# Patient Record
Sex: Female | Born: 1960 | Race: White
Health system: Southern US, Community
[De-identification: ages and names within clinical notes are randomized; demographics above are authoritative.]

## PROBLEM LIST (undated history)

## (undated) ENCOUNTER — Emergency Department

## (undated) DIAGNOSIS — N83201 Unspecified ovarian cyst, right side: Secondary | ICD-10-CM

## (undated) DIAGNOSIS — K219 Gastro-esophageal reflux disease without esophagitis: Secondary | ICD-10-CM

## (undated) DIAGNOSIS — J302 Other seasonal allergic rhinitis: Secondary | ICD-10-CM

## (undated) DIAGNOSIS — M722 Plantar fascial fibromatosis: Secondary | ICD-10-CM

## (undated) DIAGNOSIS — F40240 Claustrophobia: Secondary | ICD-10-CM

## (undated) DIAGNOSIS — I10 Essential (primary) hypertension: Secondary | ICD-10-CM

## (undated) DIAGNOSIS — D689 Coagulation defect, unspecified: Secondary | ICD-10-CM

## (undated) DIAGNOSIS — H4089 Other specified glaucoma: Secondary | ICD-10-CM

## (undated) DIAGNOSIS — K5190 Ulcerative colitis, unspecified, without complications: Secondary | ICD-10-CM

## (undated) HISTORY — PX: GLAUCOMA SURGERY: SHX656

## (undated) HISTORY — PX: TONSILLECTOMY: SUR1361

## (undated) HISTORY — PX: HYSTERECTOMY: SHX81

## (undated) HISTORY — PX: OTHER SURGICAL HISTORY: SHX169

## (undated) HISTORY — DX: Claustrophobia: F40.240

## (undated) HISTORY — PX: BUNIONECTOMY: SHX129

## (undated) HISTORY — PX: CATARACT EXTRACTION: SUR2

---

## 1999-08-15 ENCOUNTER — Other Ambulatory Visit: Admission: RE | Admit: 1999-08-15 | Discharge: 1999-08-15 | Payer: Self-pay | Admitting: Gynecology

## 2003-12-04 ENCOUNTER — Ambulatory Visit
Admit: 2003-12-04 | Disposition: A | Discharge status: Home / Self Care | Payer: Self-pay | Source: Ambulatory Visit | Admitting: Foot & Ankle Surgery

## 2003-12-14 ENCOUNTER — Ambulatory Visit: Admission: RE | Admit: 2003-12-14 | Payer: Self-pay | Source: Ambulatory Visit | Admitting: Foot & Ankle Surgery

## 2006-08-07 ENCOUNTER — Encounter: Admission: RE | Admit: 2006-08-07 | Discharge: 2006-08-07 | Payer: Self-pay | Admitting: Internal Medicine

## 2009-08-20 ENCOUNTER — Emergency Department (HOSPITAL_COMMUNITY): Admission: EM | Admit: 2009-08-20 | Discharge: 2009-08-20 | Payer: Self-pay | Admitting: Family Medicine

## 2010-06-22 DIAGNOSIS — I824Z9 Acute embolism and thrombosis of unspecified deep veins of unspecified distal lower extremity: Secondary | ICD-10-CM

## 2010-06-22 HISTORY — DX: Acute embolism and thrombosis of unspecified deep veins of unspecified distal lower extremity: I82.4Z9

## 2010-09-25 ENCOUNTER — Emergency Department (HOSPITAL_COMMUNITY): Admission: EM | Admit: 2010-09-25 | Payer: Self-pay | Source: Home / Self Care

## 2010-12-29 ENCOUNTER — Inpatient Hospital Stay
Admission: EM | Admit: 2010-12-29 | Discharge: 2011-01-02 | Disposition: A | Discharge status: Home / Self Care | Payer: Self-pay | Source: Emergency Department | Attending: Internal Medicine | Admitting: Internal Medicine

## 2010-12-29 LAB — CBC AND DIFFERENTIAL
Basophils Absolute Automated: 0.03 10*3/uL (ref 0.00–0.20)
Basophils Automated: 0 % (ref 0–2)
Eosinophils Absolute Automated: 0.5 10*3/uL (ref 0.00–0.70)
Eosinophils Automated: 5 % (ref 0–5)
Hematocrit: 33.5 % — ABNORMAL LOW (ref 37.0–47.0)
Hgb: 11 g/dL — ABNORMAL LOW (ref 12.0–16.0)
Immature Granulocytes Absolute: 0.02 10*3/uL
Immature Granulocytes: 0 % (ref 0–1)
Lymphocytes Absolute Automated: 1.75 10*3/uL (ref 0.50–4.40)
Lymphocytes Automated: 17 % (ref 15–41)
MCH: 33.2 pg — ABNORMAL HIGH (ref 28.0–32.0)
MCHC: 32.8 g/dL (ref 32.0–36.0)
MCV: 101.2 fL — ABNORMAL HIGH (ref 80.0–100.0)
MPV: 9.9 fL (ref 9.4–12.3)
Monocytes Absolute Automated: 0.89 10*3/uL (ref 0.00–1.20)
Monocytes: 9 % (ref 0–11)
Neutrophils Absolute: 7.11 10*3/uL (ref 1.80–8.10)
Neutrophils: 69 % (ref 52–75)
Nucleated RBC: 0 /100 WBC
Platelets: 325 10*3/uL (ref 140–400)
RBC: 3.31 10*6/uL — ABNORMAL LOW (ref 4.20–5.40)
RDW: 14 % (ref 12–15)
WBC: 10.28 10*3/uL (ref 3.50–10.80)

## 2010-12-29 LAB — HEPATIC FUNCTION PANEL
ALT: 20 U/L (ref 7–56)
AST (SGOT): 18 U/L (ref 5–40)
Albumin/Globulin Ratio: 1.1 (ref 1.1–1.8)
Albumin: 3.1 g/dL — ABNORMAL LOW (ref 3.7–5.1)
Alkaline Phosphatase: 71 U/L (ref 43–122)
Bilirubin Direct: 0.4 mg/dL — ABNORMAL HIGH (ref 0.0–0.3)
Bilirubin Indirect: 0.1 mg/dL (ref 0.0–1.1)
Bilirubin, Total: 0.5 mg/dL (ref 0.2–1.3)
Globulin: 2.8 g/dL (ref 2.0–3.7)
Protein, Total: 5.9 g/dL — ABNORMAL LOW (ref 6.0–8.0)

## 2010-12-29 LAB — BASIC METABOLIC PANEL
Anion Gap: 9 (ref 5.0–15.0)
BUN: 6 mg/dL — ABNORMAL LOW (ref 7–21)
CO2: 23 mEq/L (ref 22–31)
Calcium: 8.4 mg/dL — ABNORMAL LOW (ref 8.6–10.2)
Chloride: 107 mEq/L (ref 98–107)
Creatinine: 0.6 mg/dL (ref 0.5–1.4)
Glucose: 82 mg/dL (ref 70–100)
Potassium: 4.3 mEq/L (ref 3.6–5.0)
Sodium: 139 mEq/L (ref 136–143)

## 2010-12-29 LAB — PT AND APTT
PT INR: 0.9 (ref 0.9–1.1)
PT: 12.3 s — ABNORMAL LOW (ref 12.6–15.0)
PTT: 26 s (ref 23–37)

## 2010-12-29 LAB — TYPE AND SCREEN
AB Screen Gel: NEGATIVE
ABO Rh: A POS

## 2010-12-29 LAB — GFR: EGFR: >60

## 2010-12-30 LAB — PT AND APTT
PT INR: 1 (ref 0.9–1.1)
PT: 13.3 s (ref 12.6–15.0)
PTT: 70 s — ABNORMAL HIGH (ref 23–37)

## 2010-12-30 LAB — APTT: PTT: 65 s — ABNORMAL HIGH (ref 23–37)

## 2010-12-31 ENCOUNTER — Ambulatory Visit: Payer: Self-pay

## 2010-12-31 LAB — APTT: PTT: 59 s — ABNORMAL HIGH (ref 23–37)

## 2010-12-31 LAB — CBC
Hematocrit: 31.1 % — ABNORMAL LOW (ref 37.0–47.0)
Hgb: 10.3 g/dL — ABNORMAL LOW (ref 12.0–16.0)
MCH: 33.3 pg — ABNORMAL HIGH (ref 28.0–32.0)
MCHC: 33.1 g/dL (ref 32.0–36.0)
MCV: 100.6 fL — ABNORMAL HIGH (ref 80.0–100.0)
MPV: 9.8 fL (ref 9.4–12.3)
Nucleated RBC: 0 /100 WBC
Platelets: 311 10*3/uL (ref 140–400)
RBC: 3.09 10*6/uL — ABNORMAL LOW (ref 4.20–5.40)
RDW: 14 % (ref 12–15)
WBC: 8.25 10*3/uL (ref 3.50–10.80)

## 2010-12-31 LAB — URINE HCG QUALITATIVE: Urine HCG Qualitative: NEGATIVE

## 2011-01-01 LAB — PT/INR
PT INR: 1.1 (ref 0.9–1.1)
PT: 13.7 s (ref 12.6–15.0)

## 2011-01-01 LAB — CLOSTRIDIUM DIFFICILE TOXIN B PCR: Stool Clostridium difficile Toxins A and B EIA: NEGATIVE

## 2011-01-01 LAB — LAB USE ONLY - HISTORICAL SURGICAL PATHOLOGY

## 2011-01-02 LAB — PT/INR
PT INR: 1.4 — ABNORMAL HIGH (ref 0.9–1.1)
PT: 16.7 s — ABNORMAL HIGH (ref 12.6–15.0)

## 2011-01-02 LAB — CBC AND DIFFERENTIAL
Basophils Absolute Automated: 0.02 10*3/uL (ref 0.00–0.20)
Basophils Automated: 0 % (ref 0–2)
Eosinophils Absolute Automated: 0.01 10*3/uL (ref 0.00–0.70)
Eosinophils Automated: 0 % (ref 0–5)
Hematocrit: 35.1 % — ABNORMAL LOW (ref 37.0–47.0)
Hgb: 11.9 g/dL — ABNORMAL LOW (ref 12.0–16.0)
Immature Granulocytes Absolute: 0.02 10*3/uL
Immature Granulocytes: 0 % (ref 0–1)
Lymphocytes Absolute Automated: 2.05 10*3/uL (ref 0.50–4.40)
Lymphocytes Automated: 23 % (ref 15–41)
MCH: 33.8 pg — ABNORMAL HIGH (ref 28.0–32.0)
MCHC: 33.9 g/dL (ref 32.0–36.0)
MCV: 99.7 fL (ref 80.0–100.0)
MPV: 10.2 fL (ref 9.4–12.3)
Monocytes Absolute Automated: 0.69 10*3/uL (ref 0.00–1.20)
Monocytes: 8 % (ref 0–11)
Neutrophils Absolute: 6.23 10*3/uL (ref 1.80–8.10)
Neutrophils: 69 % (ref 52–75)
Nucleated RBC: 0 /100 WBC
Platelets: 404 10*3/uL — ABNORMAL HIGH (ref 140–400)
RBC: 3.52 10*6/uL — ABNORMAL LOW (ref 4.20–5.40)
RDW: 14 % (ref 12–15)
WBC: 9 10*3/uL (ref 3.50–10.80)

## 2011-03-05 LAB — ECG 12-LEAD
Atrial Rate: 86 {beats}/min
P Axis: 69 degrees
P-R Interval: 118 ms
Q-T Interval: 358 ms
QRS Duration: 80 ms
QTC Calculation (Bezet): 428 ms
R Axis: 7 degrees
T Axis: 23 degrees
Ventricular Rate: 86 {beats}/min

## 2011-05-09 NOTE — Discharge Summary (Signed)
[name], [name]      MRN:          [id number]      Account:      [id number]      Document ID:  [id number]                  Admit Date: 12/29/2010      Discharge Date: 01/02/2011            ATTENDING PHYSICIAN:  Daisey Must, MD                  ADMISSION DIAGNOSIS:      Deep venous thrombosis, history of gastrointestinal bleed and heme-positive      stool.            DISCHARGE DIAGNOSIS:      Deep venous thrombosis, history of gastrointestinal bleed and pancolitis.            HOSPITAL COURSE:      This 50 year old woman was being evaluated by her gastroenterologist      outpatient for heme-positive stool.  The patient developed an acute      thrombosis of the left leg.  She was referred to the hospital for      anticoagulation.  Given the unclear source of her GI bleed, it was not safe      for the patient to be started on outpatient Lovenox and Coumadin therapy.      Therefore, the patient was admitted to the hospital and started on heparin.       The patient was seen by her gastroenterologist.  The patient was taken to      the endoscopy suite for colonoscopy to evaluate for blood loss in the stool      before risk stratification for long-term anticoagulation for her DVT.      Colonoscopy showed that the patient had diffuse pancolitis.  She was      started on therapy with Asacol and IV Solu-Medrol.  The patient was also      started on Lovenox and Coumadin and no evidence of gastrointestinal bleed      was noted while the patient was in the hospital after she was started on      anticoagulation.  Her hematocrit was stable.            DISCHARGE MEDICATIONS:      The patient was discharged home on Lovenox and Coumadin for anticoagulation      for her DVT.  The patient was discharged home on the following medications.       Clarinex 5 mg p.o. daily, Lopressor 100 mg p.o. daily, and Lumigan      ophthalmic solution, Nexium 20 mg daily, and Timoptic 0.5 b.i.d..  The      patient was recommended to discontinue her  oral contraceptive due to DVT.      The patient will be on Lovenox 60 mg subcutaneously b.i.d., Coumadin 5 mg      p.o. at bedtime and Asacol 800 mg three times a day and prednisone 20 mg      b.i.d.            FOLLOWUP:      The patient was instructed to make a followup appointment in the office on      Monday following her discharge.  She is to call if she has any fever,      chills, nausea, vomiting, diarrhea, or any question.  Patient has no      restrictions on activity.  She can have a regular diet and shower as      needed.  Prescription was given to the patient.                                   Page 1 of 2      CRYTAL, PENSINGER      MRN:          [id number]      Account:      [id number]      Document ID:  [id number]                                    Electronic Signing Provider            D:  01/15/2011 20:02 PM by Dr. Staci Righter, MD [phone])      T:  01/16/2011 10:59 AM by BS                        cc:                                   Page 2 of 2      Authenticated by Staci Righter, MD [phone]) On 01/26/2011 12:28:35 PM

## 2011-05-09 NOTE — H&P (Signed)
[name], [name]      MRN:          [id number]      Account:      [id number]      Document ID:  [id number]                  Admit Date: 12/29/2010            Patient Location: K469-01      Patient Type: I            ATTENDING PHYSICIAN: Daisey Must, MD                  CHIEF COMPLAINT:      Swelling in the left leg for 72 hours.            HISTORY OF PRESENT ILLNESS:      The patient is a 50 year old woman who recently had heme-positive stool and      being worked up for heme-positive stool by GI.  An endoscopy and      colonoscopy has been planned for the patient in the near future.  The      patient has swelling of her left lower extremity for about 72 hours.  A      venous Doppler was ordered as an outpatient of the left leg which showed      that the patient had a DVT in the left lower extremity.  The patient was      referred to the emergency room for admission.  She denied any chest pain or      shortness of breath recently and denies any trauma to the leg.            PAST MEDICAL HISTORY:      Hypertension, allergic rhinitis, gastroesophageal reflux, and glaucoma.            ALLERGIES:      The patient has no known drug allergy.            CURRENT MEDICATIONS:      Toprol-XL 100 mg daily, Microgestin oral contraceptive pill 1 tablet daily,      Clarinex 5 mg daily, Nexium 20 mg daily, Timoptic 0.5% one drop both eyes      twice a day, and Lumigan 1 drop both eyes daily.            SOCIAL HISTORY:      The patient does report a history of occasional cigarette use, denied any      alcohol.  The patient is married.  She lives with her husband.            FAMILY HISTORY:      Not significant for cancer or not significant for any history of blood      clots.            REVIEW OF SYSTEMS:      As mentioned above.            PHYSICAL EXAMINATION:      VITAL SIGNS:  At time of admission, temperature is 98.2, pulse rate 84,                                   Page 1 of 2      [name], [name]      MRN:          [id number]  Account:      [id number]      Document ID:  [id number]                  respirations 18, and blood pressure of 115/80.      HEENT:  Anicteric sclerae, no JVD or carotid bruit.      CARDIOVASCULAR:  S1, S2 without murmur, gallops, or rub.      LUNGS:  Clear to auscultation bilaterally.      ABDOMEN:  Positive bowel sounds, soft, nontender, nondistended, no guarding      or rebound tenderness.      EXTREMITIES:  There is no clubbing, cyanosis, or edema.  There is moderate      swelling in the left lower extremity.            ASSESSMENT AND PLAN:      This is a 50 year old woman who is being worked up for heme-positive stool      with mild anemia, now has a new left leg deep venous thrombosis, most      likely secondary to her chronic use of oral contraceptive pill.  The      patient will need to be anticoagulated; however, this cannot be completed      as an outpatient because of the patient's history of heme-positive stool      that has not been worked up at this point, she will be admitted to the      hospital and started on IV heparin.  Her gastroenterologist has been      notified and plans to complete the procedure prior to final decision of      long-term anticoagulation for 6 to 9 months on this patient.  The patient      has been instructed to stop taking oral contraceptive pill.  She reported      she is up to date on mammogram screening for malignancy.                        Electronic Signing Provider            D:  12/30/2010 13:18 PM by Dr. Staci Righter, MD [phone])      T:  12/30/2010 13:44 PM by [id number]                  cc:                                   Page 2 of 2      Authenticated by Staci Righter, MD [phone]) On 01/12/2011 09:12:22 PM

## 2011-05-09 NOTE — Consults (Signed)
[name], [name]      MRN:          [id number]      Account:      [id number]      Document ID:  [id number]      Service Date: 12/31/2010            Admit Date: 12/29/2010            Patient Location: X528-41      Patient Type: I            CONSULTING PHYSICIAN: Dayton Martes MD            REFERRING PHYSICIAN: Staci Righter MD                  CONSULTING SERVICE:      Gastroenterology.            REASON FOR CONSULTATION:      Fecal occult blood test positive.            HISTORY OF PRESENT ILLNESS:      A 50 year old female admitted with a DVT of the left lower extremity.  She      is ready to start Coumadin.  Her primary care physician, Dr. Effie Shy found      that she had a positive fecal occult blood test and so she is scheduled to      undergo colonoscopy prior to starting Coumadin.            PAST MEDICAL HISTORY:      Hypertension, reflux, glaucoma.            ALLERGIES:      None.            MEDICATIONS:      Toprol, oral contraceptives, Nexium, Timoptic eyedrops.            PAST SURGICAL HISTORY:      Unremarkable.            SOCIAL HISTORY:      Occasional tobacco use and, no alcohol.            FAMILY HISTORY:      Unremarkable.            REVIEW OF SYSTEMS:      Otherwise negative.            PHYSICAL EXAMINATION:      GENERAL:  Female in no acute distress.                                   Page 1 of 2      [name], [name]      MRN:          [id number]      Account:      [id number]      Document ID:  [id number]      Service Date: 12/31/2010            HEENT:  Sclerae anicteric.      NECK:  Supple.      LUNGS:  Clear to auscultation.      CARDIAC:  Regular rate and rhythm.      ABDOMEN:  Soft, nontender, nondistended.      EXTREMITIES:  No clubbing, cyanosis.  There is pedal edema on the left      lower extremity.  LABORATORY DATA:      Pending.            IMPRESSION:      Heme-positive stools, ready to start anticoagulation.            RECOMMENDATIONS:      Will schedule patient for  colonoscopy.  Indications and risks of procedure      including pain, bleeding, infection, perforation were discussed with the      patient.            Thank you for this kind referral.                        Electronic Signing Provider            D:  12/31/2010 10:55 AM by Dr. Dayton Martes, MD ([id number])      T:  12/31/2010 14:11 PM by [id number]                  cc:                                   Page 2 of 2      Authenticated by Dayton Martes, MD ([id number]) On 01/12/2011 01:48:51 PM

## 2011-06-09 NOTE — Op Note (Signed)
Introduction: [id number] Document ID: [id number] -- 50 year old female      patient presents for an outpatient Colonoscopy on 12/31/2010.            Indications: Fecal occult blood positive (792.1).            Consent: The benefits, risks, and alternatives to the procedure were      discussed and informed consent was obtained from the patient.            Preparation: EKG, pulse, pulse oximetry, and blood pressure were monitored      throughout the procedure.            Medications: IVA anesthesia.            Rectal Exam: Normal rectal exam.            Procedure: The colonoscope was passed through the anus under direct      visualization and was advanced with ease to the terminal ileum. The scope      was withdrawn and the mucosa was carefully examined. The views were      excellent. The patient's toleration of the procedure was excellent.      Retroflexion was performed in the rectum.            Estimated Blood Loss: Negligible.            Findings: There was evidence of severe colitis in the entire colon. The      mucosa appeared bloody, cobblestoned, and ulcerated. A biopsy was taken      from the whole colon. The ileum appeared to be normal.            Unplanned Events: There were no unplanned events.            Summary: Severe colitis (558.9) found in the entire colon. Biopsy taken.      Normal ileum.            Recommendations: Check c. Diff. Follow-up on the results of the biopsy      specimens.            Procedure Codes:            Performed By:      Version 1, electronically signed by  on 12/31/2010 at 12:40.      [email or web address]

## 2011-06-23 HISTORY — PX: EYE SURGERY: SHX253

## 2011-07-01 ENCOUNTER — Ambulatory Visit
Admit: 2011-07-01 | Discharge: 2011-07-01 | Disposition: A | Discharge status: Home / Self Care | Payer: Self-pay | Source: Ambulatory Visit

## 2011-07-09 ENCOUNTER — Ambulatory Visit
Admission: RE | Admit: 2011-07-09 | Disposition: A | Discharge status: Home / Self Care | Payer: Self-pay | Source: Ambulatory Visit | Attending: Ophthalmology | Admitting: Ophthalmology

## 2011-07-30 NOTE — Op Note (Signed)
SEVILLA, MURTAGH                                         MRN:          [id number]                                                          Account:      [id number]                                         Document ID:  [id number]                                                   Procedure Date: 07/09/2011                                                                                    Admit Date: 07/09/2011     Patient Location: DISCHARGED 07/09/2011  Patient Type: A     SURGEON: Lamount Cranker MD  ASSISTANT:none        PREOPERATIVE DIAGNOSIS:  Open-angle glaucoma, left eye.     POSTOPERATIVE DIAGNOSIS:  Open-angle glaucoma, left eye.     TITLE OF PROCEDURE:  Trabeculectomy with mitomycin-C, left eye.     COMPLICATIONS:  None.     ANESTHESIA:  General LMA.     COMPLICATIONS:  None.     DESCRIPTION OF PROCEDURE:  After appropriate informed consent was obtained, the patient was taken to  the operating room, placed in supine position.  Anesthesia performed  general anesthesia and the left eye was then prepped and draped in the  usual sterile fashion for ophthalmic surgery.  A drop of tetracaine was  given.  A lid speculum was placed.  A 7-0 Vicryl suture was placed through  superior clear cornea.  The eye was rotated inferiorly.  Conjunctival  incision was made at the limbus.  A sub-Tenon's injection of mitomycin-C  with lidocaine and epinephrine in a 1:2 mixture was given.  This was also  given in the subconjunctival space.  The overlying conjunctiva and Tenon  were then removed from bare sclera using a combination of blunt and sharp  dissection at the limbus superonasally.  Bipolar cautery was used as needed  for hemostasis.  A partial-thickness scleral flap was created into clear  cornea using a supersharp and bent crescent knife.  A supersharp was used  for this dissection.  Balanced salt solution was used to irrigate out any  residual mitomycin in the subconjunctival and Tenon space.  Paracentesis  was made and  viscoelastic was placed into the anterior chamber, followed  by  entry of the hinge of the scleral flap with sclerectomy using [facility] punch                                                                                                           Page 1 of 2  DALANI, METTE                                         MRN:          [id number]                                                          Account:      [id number]                                         Document ID:  [id number]                                                   Procedure Date: 07/09/2011                                                                                    and iridectomy with Vannas scissors and bipolar cautery for hemostasis.  Preplaced 10-0 nylon sutures were then used to close the scleral flap.  A  total of four 10-0 nylon sutures were used, 2 at the apex and 2 centrally.  All knots were buried.  Balanced salt solution was injected into the  anterior chamber with nice anterior chamber depth and adequate egress of  slow flow through the scleral flap.  Miochol was used to constrict the  pupil.  The overlying conjunctiva was closed with 10-0 Vicryl suture in a  mattress fashion centrally through a premade corneal groove.  Two  additional 10 Vicryl sutures were used nasally and temporally to close the  conjunctival wound at the limbus with knots buried into clear cornea.  The  wounds were found to be watertight with nice elevation of bleb after  irrigation of balanced salt solution into the anterior chamber.  Sub-Tenon  injection of Ancef and Decadron was given prior to the end of the case.  The 7-0 Vicryl  suture was removed and a drop of atropine, prednisolone  acetate and ofloxacin, followed by antibiotic ointment, sterile patch and  shield were given.  The patient went to the recovery room in stable  condition without any complications.                 D:  07/09/2011 18:16 PM by Dr. Hughie Closs I. Ninfa Linden, MD ([id number])  T:  07/09/2011 22:48  PM by [id number]        cc:                                                                                                           Page 2 of 2  Authenticated and Edited by Hughie Closs I. Ninfa Linden, MD ([id number]) On 07/30/11 12:24:00 PM

## 2011-09-23 MED ORDER — DIPHENHYDRAMINE HCL 25 MG PO CAPS
25.00 mg | ORAL_CAPSULE | Freq: Once | ORAL | Status: AC
Start: 2011-09-24 — End: 2011-09-24

## 2011-09-23 MED ORDER — DIPHENHYDRAMINE HCL 50 MG/ML IJ SOLN
50.00 mg | Freq: Once | INTRAMUSCULAR | Status: DC
Start: 2011-09-24 — End: 2011-10-01

## 2011-09-23 MED ORDER — DIPHENHYDRAMINE HCL 50 MG/ML IJ SOLN
25.00 mg | Freq: Once | INTRAMUSCULAR | Status: AC
Start: 2011-09-24 — End: 2011-09-24
  Administered 2011-09-24: 25 mg via INTRAVENOUS
  Filled 2011-09-23: qty 1

## 2011-09-23 MED ORDER — DIPHENHYDRAMINE HCL 25 MG PO CAPS
50.00 mg | ORAL_CAPSULE | Freq: Once | ORAL | Status: DC
Start: 2011-09-24 — End: 2011-10-01

## 2011-09-23 MED ORDER — METHYLPREDNISOLONE SODIUM SUCC 40 MG IJ SOLR
25.00 mg | Freq: Once | INTRAMUSCULAR | Status: DC | PRN
Start: 2011-09-24 — End: 2011-10-01

## 2011-09-23 MED ORDER — DIPHENHYDRAMINE HCL 50 MG/ML IJ SOLN
50.00 mg | Freq: Once | INTRAMUSCULAR | Status: DC | PRN
Start: 2011-09-24 — End: 2011-10-01

## 2011-09-23 MED ORDER — ACETAMINOPHEN 325 MG PO TABS
650.00 mg | ORAL_TABLET | Freq: Once | ORAL | Status: AC
Start: 2011-09-24 — End: 2011-09-24
  Administered 2011-09-24: 650 mg via ORAL
  Filled 2011-09-23: qty 2

## 2011-09-24 ENCOUNTER — Ambulatory Visit
Admission: RE | Admit: 2011-09-24 | Discharge: 2011-09-24 | Disposition: A | Discharge status: Home / Self Care | Payer: BC Managed Care – PPO | Source: Ambulatory Visit | Attending: Gastroenterology | Admitting: Gastroenterology

## 2011-09-24 VITALS — BP 120/82 | HR 56 | Temp 95.6°F | Resp 18 | Ht 63.0 in | Wt 139.0 lb

## 2011-09-24 DIAGNOSIS — K5289 Other specified noninfective gastroenteritis and colitis: Secondary | ICD-10-CM | POA: Insufficient documentation

## 2011-09-24 HISTORY — DX: Other seasonal allergic rhinitis: J30.2

## 2011-09-24 HISTORY — DX: Essential (primary) hypertension: I10

## 2011-09-24 HISTORY — DX: Other specified glaucoma: H40.89

## 2011-09-24 HISTORY — DX: Coagulation defect, unspecified: D68.9

## 2011-09-24 HISTORY — DX: Gastro-esophageal reflux disease without esophagitis: K21.9

## 2011-09-24 MED ORDER — INFLIXIMAB 100 MG IV SOLR
300.00 mg | Freq: Once | INTRAVENOUS | Status: AC
Start: 2011-09-24 — End: 2011-09-24
  Administered 2011-09-24: 300 mg via INTRAVENOUS
  Filled 2011-09-24: qty 300

## 2011-09-24 NOTE — Progress Notes (Signed)
Pt is here for Remicade today.  Medication started at 10cc hr for 15 minutes, then advanced to 20cchr for 15 minutes. Advanced to 40cc hr for 15 min, 80cchr for 15 minutes, to 150 cchr for 30 minutes and to 250cc hr til finished. Pt understands possible complications of drug and when to call the doctor for problems and for follow up. Liliane Shi RN [facility]

## 2011-09-24 NOTE — Patient Instructions (Signed)
AFTER REMICADE WATCH FOR ALLERGIC REACTIONS SUCH AS : itching or hives, swelling in your face or hands, swelling or tingling in your mouth or throat,chest tightness, trouble breathing. Watch for a change in the color of your urine and stools, change in the amount and how often you urinate. Fast,slow,or pounding heartbeat or chest pain. Fever,headache, sore throat, chills  or body aches, joint or muscle pain.    Also lightheadedness,  dizziness, fainting, nausea, vomiting, loss of appetite, or pain in your upper stomach.  Numbness, tingling, or burning pain in your hands, arms, legs, or feet. Problems with vision, speech, or walking.   Seizures. Swollen glands. Trouble breathing or swallowing.  Unexplained weight  loss or night sweats or the yellowing of your skin and or the whites of your eyes.  Call your doctor if any of these occur and or if concerns.   Jendayi Berling L Klynn Linnemann RN BC

## 2011-09-24 NOTE — Progress Notes (Signed)
1225 Pt left walking to go to a meeting in [employer].

## 2011-10-21 ENCOUNTER — Ambulatory Visit
Admission: RE | Admit: 2011-10-21 | Discharge: 2011-10-21 | Disposition: A | Discharge status: Home / Self Care | Payer: BC Managed Care – PPO | Source: Ambulatory Visit | Attending: Gastroenterology | Admitting: Gastroenterology

## 2011-10-21 ENCOUNTER — Other Ambulatory Visit: Payer: Self-pay | Admitting: Gastroenterology

## 2011-10-21 DIAGNOSIS — K5190 Ulcerative colitis, unspecified, without complications: Secondary | ICD-10-CM

## 2011-10-28 LAB — PROMETHEUS TPMT GENETICS - SOFT

## 2011-11-11 MED ORDER — DIPHENHYDRAMINE HCL 50 MG/ML IJ SOLN
50.00 mg | INTRAMUSCULAR | Status: DC | PRN
Start: 2011-11-12 — End: 2011-11-14

## 2011-11-11 MED ORDER — DIPHENHYDRAMINE HCL 25 MG PO CAPS
50.00 mg | ORAL_CAPSULE | Freq: Once | ORAL | Status: DC
Start: 2011-11-12 — End: 2011-11-14

## 2011-11-11 MED ORDER — DIPHENHYDRAMINE HCL 25 MG PO CAPS
25.00 mg | ORAL_CAPSULE | Freq: Once | ORAL | Status: AC
Start: 2011-11-12 — End: 2011-11-12

## 2011-11-11 MED ORDER — METHYLPREDNISOLONE SODIUM SUCC 40 MG IJ SOLR
25.00 mg | INTRAMUSCULAR | Status: DC | PRN
Start: 2011-11-12 — End: 2011-11-14

## 2011-11-11 MED ORDER — DIPHENHYDRAMINE HCL 50 MG/ML IJ SOLN
25.00 mg | Freq: Once | INTRAMUSCULAR | Status: AC
Start: 2011-11-12 — End: 2011-11-12
  Administered 2011-11-12: 25 mg via INTRAVENOUS
  Filled 2011-11-11: qty 1

## 2011-11-11 MED ORDER — DIPHENHYDRAMINE HCL 50 MG/ML IJ SOLN
50.00 mg | Freq: Once | INTRAMUSCULAR | Status: DC
Start: 2011-11-12 — End: 2011-11-14

## 2011-11-11 MED ORDER — ACETAMINOPHEN 325 MG PO TABS
650.00 mg | ORAL_TABLET | Freq: Once | ORAL | Status: AC
Start: 2011-11-12 — End: 2011-11-12
  Administered 2011-11-12: 650 mg via ORAL
  Filled 2011-11-11: qty 2

## 2011-11-12 ENCOUNTER — Ambulatory Visit
Admission: RE | Admit: 2011-11-12 | Discharge: 2011-11-12 | Disposition: A | Discharge status: Home / Self Care | Payer: BC Managed Care – PPO | Source: Ambulatory Visit | Attending: Gastroenterology | Admitting: Gastroenterology

## 2011-11-12 VITALS — BP 123/62 | HR 72 | Temp 97.7°F | Resp 16 | Ht 63.0 in | Wt 140.0 lb

## 2011-11-12 DIAGNOSIS — K5190 Ulcerative colitis, unspecified, without complications: Secondary | ICD-10-CM | POA: Insufficient documentation

## 2011-11-12 MED ORDER — INFLIXIMAB 100 MG IV SOLR
300.00 mg | Freq: Once | INTRAVENOUS | Status: AC
Start: 2011-11-12 — End: 2011-11-12
  Administered 2011-11-12: 300 mg via INTRAVENOUS
  Filled 2011-11-12: qty 300

## 2011-11-12 NOTE — Discharge Instructions (Signed)
Discharge Instructions for Ulcerative Colitis  You have been diagnosed with ulcerative colitis. Ulcerative colitisis inflammation (irritation and swelling) that occurs in the rectum and colon. It is a form of inflammatory bowel disease (IBD). No one knows what causes IBD, but the symptoms can be treated. People with IBD can lead full,active lives.  Home Care   Follow the diet that was prescribed for you in the hospital.   Avoid any foods that make your symptoms worse. These foods vary from person to person.   Keep a diary of foods that disagree with you and share this information with your doctor or nutritionist.   Take your medications as directed. The doctor may ask you to take several different types.   Talk to your doctor about the need for surgery. Some patients choose to have their colon removed. This treatment has side effects. Only you and your doctor can make this decision.  Follow-Up  Make a follow-up appointment as directed by our staff.  When to Call Your Doctor  Call your doctor immediately if you have any of the following:   Bleeding from your rectum   Pain or cramping in your abdomen   Urgent need to go to the bathroom   Bloody diarrhea   Fever above 100F   Weight loss   Nausea   Vomiting    [address] [place], [place] [address]. All rights reserved. This information is not intended as a substitute for professional medical care. Always follow your healthcare professional's instructions.  Infliximab Solution for injection  What is this medicine?  INFLIXIMAB (in FLIX i mab) is used to treat Crohn's disease and ulcerative colitis. It is also used to treat ankylosing spondylitis, psoriasis, and some forms of arthritis.  This medicine may be used for other purposes; ask your health care provider or pharmacist if you have questions.  What should I tell my health care provider before I take this medicine?  They need to know if you have any of these  conditions:   diabetes   exposure to tuberculosis   heart failure   hepatitis or liver disease   immune system problems   infection   lung or breathing disease, like COPD   multiple sclerosis   current or past resident of [place]   seizure disorder   an unusual or allergic reaction to infliximab, mouse proteins, other medicines, foods, dyes, or preservatives   pregnant or trying to get pregnant   breast-feeding  How should I use this medicine?  This medicine is for injection into a vein. It is usually given by a health care professional in a hospital or clinic setting.  A special MedGuide will be given to you by the pharmacist with each prescription and refill. Be sure to read this information carefully each time.  Talk to your pediatrician regarding the use of this medicine in children. Special care may be needed.  Overdosage: If you think you have taken too much of this medicine contact a poison control center or emergency room at once.  NOTE: This medicine is only for you. Do not share this medicine with others.  What if I miss a dose?  It is important not to miss your dose. Call your doctor or health care professional if you are unable to keep an appointment.  What may interact with this medicine?  Do not take this medicine with any of the following medications:   anakinra   rilonacept  This medicine may also interact with the following medications:   vaccines  This list may not describe all possible interactions. Give your health care provider a list of all the medicines, herbs, non-prescription drugs, or dietary supplements you use. Also tell them if you smoke, drink alcohol, or use illegal drugs. Some items may interact with your medicine.  What should I watch for while using this medicine?  Visit your doctor or health care professional for regular checks on your progress.  If you get a cold or other infection while receiving this medicine, call your doctor or health  care professional. Do not treat yourself. This medicine may decrease your body's ability to fight infections. Before beginning therapy, your doctor may do a test to see if you have been exposed to tuberculosis.  This medicine may make the symptoms of heart failure worse in some patients. If you notice symptoms such as increased shortness of breath or swelling of the ankles or legs, contact your health care provider right away.  If you are going to have surgery or dental work, tell your health care professional or dentist that you have received this medicine.  If you take this medicine for plaque psoriasis, stay out of the sun. If you cannot avoid being in the sun, wear protective clothing and use sunscreen. Do not use sun lamps or tanning beds/booths.  What side effects may I notice from receiving this medicine?  Side effects that you should report to your doctor or health care professional as soon as possible:   allergic reactions like skin rash, itching or hives, swelling of the face, lips, or tongue   chest pain   fever or chills, usually related to the infusion   muscle or joint pain   red, scaly patches or raised bumps on the skin   signs of infection - fever or chills, cough, sore throat, pain or difficulty passing urine   swollen lymph nodes in the neck, underarm, or groin areas   unexplained weight loss   unusual bleeding or bruising   unusually weak or tired   yellowing of the eyes or skin  Side effects that usually do not require medical attention (report to your doctor or health care professional if they continue or are bothersome):   headache   heartburn or stomach pain   nausea, vomiting  This list may not describe all possible side effects. Call your doctor for medical advice about side effects. You may report side effects to FDA at 1-800-FDA-1088.  Where should I keep my medicine?  This drug is given in a hospital or clinic and will not be stored at home.  NOTE:This sheet is a summary.  It may not cover all possible information. If you have questions about this medicine, talk to your doctor, pharmacist, or health care provider. Copyright 2011 [employer] Standard  ULCERATIVE COLITIS  You have been diagnosed with ulcerative colitis. Ulcerative colitis is inflammation that occurs in the rectum and colon. It is a form of Inflammatory Bowel Disease (IBD). No one knows what causes IBD, but the symptoms can be treated and people with IBD can lead full, active lives.  HOME CARE:   Follow the diet that was prescribed for you by your doctor.   Avoid any foods that make your symptoms worse. These foods vary from person to person.   Keep a diary of foods that disagree with you, and share this information with your doctor or nutritionist.   Take your medications as directed.  FOLLOW UP with your doctor or as advised by our staff. Report any unintended weight loss over 10 pounds over 3-6 months to your doctor.  GET PROMPT MEDICAL ATTENTION if any of the following occur:   Bleeding from your rectum   Frequent diarrhea or abdominal pain not controlled by your medicine   Bloody diarrhea   Fever of 100.68F (38C) or higher, or as directed by your healthcare provider   Persistent nausea or repeated vomiting   2000-2011 [employer], [address], [place], [place] [address]. All rights reserved. This information is not intended as a substitute for professional medical care. Always follow your healthcare professional's instruct  ULCERATIVE COLITIS  You have been diagnosed with ulcerative colitis. Ulcerative colitis is inflammation that occurs in the rectum and colon. It is a form of Inflammatory Bowel Disease (IBD). No one knows what causes IBD, but the symptoms can be treated and people with IBD can lead full, active lives.  HOME CARE:   Follow the diet that was prescribed for you by your doctor.   Avoid any foods that make your symptoms worse. These foods vary from person to person.   Keep a diary of  foods that disagree with you, and share this information with your doctor or nutritionist.   Take your medications as directed.  FOLLOW UP with your doctor or as advised by our staff. Report any unintended weight loss over 10 pounds over 3-6 months to your doctor.  GET PROMPT MEDICAL ATTENTION if any of the following occur:   Bleeding from your rectum   Frequent diarrhea or abdominal pain not controlled by your medicine   Bloody diarrhea   Fever of 100.68F (38C) or higher, or as directed by your healthcare provider   Persistent nausea or repeated vomiting   2000-2011 [employer], [address] [place], [place] [address]. All rights reserved. This information is not intended as a substitute for professional medical care. Always follow your healthcare professional's instructions.  ULCERATIVE COLITIS  You have been diagnosed with ulcerative colitis. Ulcerative colitis is inflammation that occurs in the rectum and colon. It is a form of Inflammatory Bowel Disease (IBD). No one knows what causes IBD, but the symptoms can be treated and people with IBD can lead full, active lives.  HOME CARE:   Follow the diet that was prescribed for you by your doctor.   Avoid any foods that make your symptoms worse. These foods vary from person to person.   Keep a diary of foods that disagree with you, and share this information with your doctor or nutritionist.   Take your medications as directed.  FOLLOW UP with your doctor or as advised by our staff. Report any unintended weight loss over 10 pounds over 3-6 months to your doctor.  GET PROMPT MEDICAL ATTENTION if any of the following occur:   Bleeding from your rectum   Frequent diarrhea or abdominal pain not controlled by your medicine   Bloody diarrhea   Fever of 100.68F (38C) or higher, or as directed by your healthcare provider   Persistent nausea or repeated vomiting   2000-2011 [employer], [address], [place], [place] [address]. All rights  reserved. This information is not intended as a substitute for professional medical care. Always follow your healthcare professional's instructions.ions.

## 2011-11-12 NOTE — Progress Notes (Signed)
6433  Arrived ambulatory to [facility] for a remicade infusion. States she had a flare and was started on mercaptopurine. Premeds given.  Remicade was started at 10 mL /hour x15 minutes, then 20 mL/hr x15 minutes, then 40 mL/hr for 15 minutes, then 80 mL/hr for 15 min, then 150 mL/hr for 30 min., then 250 mL/hr till infusion completed. 1210  Remicade completed without problem.  Patient states she will call MD for problems.  1216 Patient left in NAD, ambulatory.

## 2011-12-30 MED ORDER — DIPHENHYDRAMINE HCL 50 MG/ML IJ SOLN
50.00 mg | Freq: Once | INTRAMUSCULAR | Status: DC
Start: 2011-12-31 — End: 2012-01-01

## 2011-12-30 MED ORDER — DIPHENHYDRAMINE HCL 50 MG/ML IJ SOLN
25.00 mg | Freq: Once | INTRAMUSCULAR | Status: AC
Start: 2011-12-31 — End: 2011-12-31
  Administered 2011-12-31: 25 mg via INTRAVENOUS
  Filled 2011-12-30: qty 1

## 2011-12-30 MED ORDER — DIPHENHYDRAMINE HCL 25 MG PO CAPS
50.00 mg | ORAL_CAPSULE | Freq: Once | ORAL | Status: DC
Start: 2011-12-31 — End: 2012-01-01

## 2011-12-30 MED ORDER — DIPHENHYDRAMINE HCL 25 MG PO CAPS
25.00 mg | ORAL_CAPSULE | Freq: Once | ORAL | Status: AC
Start: 2011-12-31 — End: 2011-12-31

## 2011-12-30 MED ORDER — INFLIXIMAB 100 MG IV SOLR
300.00 mg | Freq: Once | INTRAVENOUS | Status: AC
Start: 2011-12-31 — End: 2011-12-31
  Administered 2011-12-31: 300 mg via INTRAVENOUS
  Filled 2011-12-30: qty 300

## 2011-12-30 MED ORDER — DIPHENHYDRAMINE HCL 50 MG/ML IJ SOLN
50.00 mg | INTRAMUSCULAR | Status: DC | PRN
Start: 2011-12-31 — End: 2012-01-01

## 2011-12-30 MED ORDER — METHYLPREDNISOLONE SODIUM SUCC 40 MG IJ SOLR
25.00 mg | INTRAMUSCULAR | Status: DC | PRN
Start: 2011-12-31 — End: 2012-01-01

## 2011-12-30 MED ORDER — ACETAMINOPHEN 325 MG PO TABS
650.00 mg | ORAL_TABLET | Freq: Once | ORAL | Status: AC
Start: 2011-12-31 — End: 2011-12-31
  Administered 2011-12-31: 650 mg via ORAL
  Filled 2011-12-30: qty 2

## 2011-12-31 ENCOUNTER — Ambulatory Visit
Admission: RE | Admit: 2011-12-31 | Discharge: 2011-12-31 | Disposition: A | Discharge status: Home / Self Care | Payer: BC Managed Care – PPO | Source: Ambulatory Visit | Attending: Gastroenterology | Admitting: Gastroenterology

## 2011-12-31 VITALS — BP 117/64 | HR 56 | Temp 97.6°F | Resp 16 | Ht 63.0 in | Wt 139.0 lb

## 2011-12-31 DIAGNOSIS — K5289 Other specified noninfective gastroenteritis and colitis: Secondary | ICD-10-CM | POA: Insufficient documentation

## 2011-12-31 DIAGNOSIS — K5190 Ulcerative colitis, unspecified, without complications: Secondary | ICD-10-CM

## 2011-12-31 HISTORY — DX: Ulcerative colitis, unspecified, without complications: K51.90

## 2011-12-31 NOTE — Progress Notes (Signed)
9147  Arrived ambulatory to [facility] for a remicade infusion.  No complaints.  Premeds given.  1019 Remicade was started at 10 mL /hour x15 minutes, then 20 mL/hr x15 minutes, then 40 mL/hr for 15 minutes, then 80 mL/hr for 15 min, then 150 mL/hr for 30 min., then 250 mL/hr till infusion completed.  1228 Remicade completed without problem.  Patient states she will call MD for problems. 1233  Patient left in NAD, ambulatory.

## 2012-01-01 ENCOUNTER — Ambulatory Visit: Payer: BC Managed Care – PPO | Attending: Ophthalmology

## 2012-01-01 NOTE — Pre-Procedure Instructions (Signed)
All preops done  Faxed to surgeon for preops/HP/consent  Faxed dos order to pharmacy

## 2012-01-01 NOTE — Pre-Procedure Instructions (Signed)
Clearance/abn labs sent for scan  Faxed to surgeon for better copy of ekg

## 2012-01-07 ENCOUNTER — Encounter: Payer: Self-pay | Admitting: Certified Registered"

## 2012-01-07 ENCOUNTER — Ambulatory Visit: Payer: BC Managed Care – PPO | Admitting: Certified Registered"

## 2012-01-07 ENCOUNTER — Ambulatory Visit
Admission: RE | Admit: 2012-01-07 | Discharge: 2012-01-07 | Disposition: A | Discharge status: Home / Self Care | Payer: BC Managed Care – PPO | Source: Ambulatory Visit | Attending: Ophthalmology | Admitting: Ophthalmology

## 2012-01-07 ENCOUNTER — Encounter
Admission: RE | Disposition: A | Discharge status: Home / Self Care | Payer: Self-pay | Source: Ambulatory Visit | Attending: Ophthalmology

## 2012-01-07 ENCOUNTER — Ambulatory Visit: Payer: BC Managed Care – PPO | Admitting: Ophthalmology

## 2012-01-07 DIAGNOSIS — I10 Essential (primary) hypertension: Secondary | ICD-10-CM | POA: Insufficient documentation

## 2012-01-07 DIAGNOSIS — K219 Gastro-esophageal reflux disease without esophagitis: Secondary | ICD-10-CM | POA: Insufficient documentation

## 2012-01-07 DIAGNOSIS — H401290 Low-tension glaucoma, unspecified eye, stage unspecified: Secondary | ICD-10-CM

## 2012-01-07 DIAGNOSIS — Z86718 Personal history of other venous thrombosis and embolism: Secondary | ICD-10-CM | POA: Insufficient documentation

## 2012-01-07 DIAGNOSIS — H409 Unspecified glaucoma: Secondary | ICD-10-CM | POA: Insufficient documentation

## 2012-01-07 DIAGNOSIS — K5190 Ulcerative colitis, unspecified, without complications: Secondary | ICD-10-CM | POA: Insufficient documentation

## 2012-01-07 DIAGNOSIS — H4011X0 Primary open-angle glaucoma, stage unspecified: Secondary | ICD-10-CM | POA: Insufficient documentation

## 2012-01-07 DIAGNOSIS — Z7901 Long term (current) use of anticoagulants: Secondary | ICD-10-CM | POA: Insufficient documentation

## 2012-01-07 SURGERY — TRABECULECTOMY
Anesthesia: Anesthesia MAC / Sedation | Site: Eye | Laterality: Right | Wound class: Clean

## 2012-01-07 MED ORDER — ONDANSETRON HCL 4 MG/2ML IJ SOLN
INTRAMUSCULAR | Status: DC | PRN
Start: 2012-01-07 — End: 2012-01-07
  Administered 2012-01-07: 4 mg via INTRAVENOUS

## 2012-01-07 MED ORDER — LIDOCAINE HCL 2 % IJ SOLN
INTRAMUSCULAR | Status: DC | PRN
Start: 2012-01-07 — End: 2012-01-07
  Administered 2012-01-07: 100 mg

## 2012-01-07 MED ORDER — HYDROMORPHONE HCL PF 1 MG/ML IJ SOLN
0.5000 mg | INTRAMUSCULAR | Status: DC | PRN
Start: 2012-01-07 — End: 2012-01-07

## 2012-01-07 MED ORDER — TETRACAINE HCL 0.5 % OP SOLN
OPHTHALMIC | Status: DC | PRN
Start: 2012-01-07 — End: 2012-01-07
  Administered 2012-01-07: 2 [drp] via OPHTHALMIC

## 2012-01-07 MED ORDER — BSS IO SOLN
INTRAOCULAR | Status: DC | PRN
Start: 2012-01-07 — End: 2012-01-07
  Administered 2012-01-07: 10 mL via OPHTHALMIC

## 2012-01-07 MED ORDER — OFLOXACIN 0.3 % OP SOLN
OPHTHALMIC | Status: DC | PRN
Start: 2012-01-07 — End: 2012-01-07
  Administered 2012-01-07: 2 [drp] via OPHTHALMIC

## 2012-01-07 MED ORDER — ONDANSETRON HCL 4 MG/2ML IJ SOLN
4.0000 mg | Freq: Once | INTRAMUSCULAR | Status: DC | PRN
Start: 2012-01-07 — End: 2012-01-07

## 2012-01-07 MED ORDER — FENTANYL CITRATE 0.05 MG/ML IJ SOLN
INTRAMUSCULAR | Status: DC | PRN
Start: 2012-01-07 — End: 2012-01-07
  Administered 2012-01-07: 100 ug via INTRAVENOUS

## 2012-01-07 MED ORDER — MITOMYCIN CHEMO INTRAOCULAR INJECTION 0.4 MG/ML
5.0000 mL | Status: AC
Start: 2012-01-07 — End: 2012-01-07
  Administered 2012-01-07: .5 mL via INTRAOCULAR
  Filled 2012-01-07: qty 10

## 2012-01-07 MED ORDER — OXYCODONE-ACETAMINOPHEN 5-325 MG PO TABS
1.0000 | ORAL_TABLET | Freq: Once | ORAL | Status: AC | PRN
Start: 2012-01-07 — End: 2012-01-07

## 2012-01-07 MED ORDER — OXYCODONE-ACETAMINOPHEN 5-325 MG PO TABS
ORAL_TABLET | ORAL | Status: AC
Start: 2012-01-07 — End: 2012-01-07
  Administered 2012-01-07: 1 via ORAL
  Filled 2012-01-07: qty 1

## 2012-01-07 MED ORDER — MEPERIDINE HCL 25 MG/ML IJ SOLN
25.0000 mg | INTRAMUSCULAR | Status: DC | PRN
Start: 2012-01-07 — End: 2012-01-07

## 2012-01-07 MED ORDER — MIDAZOLAM HCL 2 MG/2ML IJ SOLN
INTRAMUSCULAR | Status: AC
Start: 2012-01-07 — End: ?
  Filled 2012-01-07: qty 1

## 2012-01-07 MED ORDER — MIDAZOLAM HCL 2 MG/2ML IJ SOLN
INTRAMUSCULAR | Status: DC | PRN
Start: 2012-01-07 — End: 2012-01-07
  Administered 2012-01-07: 2 mg via INTRAVENOUS

## 2012-01-07 MED ORDER — LACTATED RINGERS IV SOLN
INTRAVENOUS | Status: DC | PRN
Start: 2012-01-07 — End: 2012-01-07

## 2012-01-07 MED ORDER — ACETYLCHOLINE CHLORIDE 1:100 IO SOLR
INTRAOCULAR | Status: DC | PRN
Start: 2012-01-07 — End: 2012-01-07
  Administered 2012-01-07: 1 mL via INTRAOCULAR

## 2012-01-07 MED ORDER — FENTANYL CITRATE 0.05 MG/ML IJ SOLN
INTRAMUSCULAR | Status: AC
Start: 2012-01-07 — End: 2012-01-07
  Administered 2012-01-07: 25 ug via INTRAVENOUS
  Filled 2012-01-07: qty 2

## 2012-01-07 MED ORDER — CEFAZOLIN SODIUM 1 G IJ SOLR
INTRAMUSCULAR | Status: DC | PRN
Start: 2012-01-07 — End: 2012-01-07
  Administered 2012-01-07: 1 g via SUBCONJUNCTIVAL

## 2012-01-07 MED ORDER — LIDOCAINE HCL 2 % EX GEL
CUTANEOUS | Status: AC
Start: 2012-01-07 — End: ?
  Filled 2012-01-07: qty 0.5

## 2012-01-07 MED ORDER — ERYTHROMYCIN 5 MG/GM OP OINT
TOPICAL_OINTMENT | OPHTHALMIC | Status: DC | PRN
Start: 2012-01-07 — End: 2012-01-07
  Administered 2012-01-07: 1 via OPHTHALMIC

## 2012-01-07 MED ORDER — LIDOCAINE-EPINEPHRINE 2 %-1:200000 IJ SOLN
INTRAMUSCULAR | Status: AC
Start: 2012-01-07 — End: ?
  Filled 2012-01-07: qty 1

## 2012-01-07 MED ORDER — PROMETHAZINE HCL 25 MG/ML IJ SOLN
6.2500 mg | Freq: Once | INTRAMUSCULAR | Status: DC | PRN
Start: 2012-01-07 — End: 2012-01-07

## 2012-01-07 MED ORDER — LIDOCAINE-EPINEPHRINE 2 %-1:200000 IJ SOLN
INTRAMUSCULAR | Status: DC | PRN
Start: 2012-01-07 — End: 2012-01-07
  Administered 2012-01-07: .5 mL

## 2012-01-07 MED ORDER — FENTANYL CITRATE 0.05 MG/ML IJ SOLN
25.0000 ug | INTRAMUSCULAR | Status: DC | PRN
Start: 2012-01-07 — End: 2012-01-07
  Administered 2012-01-07: 25 ug via INTRAVENOUS

## 2012-01-07 MED ORDER — STERILE WATER FOR IRRIGATION IR SOLN
Status: DC | PRN
Start: 2012-01-07 — End: 2012-01-07
  Administered 2012-01-07: 100 mL

## 2012-01-07 MED ORDER — PREDNISOLONE ACETATE 1 % OP SUSP
OPHTHALMIC | Status: DC | PRN
Start: 2012-01-07 — End: 2012-01-07
  Administered 2012-01-07: 2 [drp] via OPHTHALMIC

## 2012-01-07 MED ORDER — PROPOFOL 10 MG/ML IV EMUL
INTRAVENOUS | Status: AC
Start: 2012-01-07 — End: ?
  Filled 2012-01-07: qty 1

## 2012-01-07 MED ORDER — ONDANSETRON HCL 4 MG/2ML IJ SOLN
INTRAMUSCULAR | Status: AC
Start: 2012-01-07 — End: ?
  Filled 2012-01-07: qty 2

## 2012-01-07 MED ORDER — ATROPINE SULFATE 1 % OP SOLN
OPHTHALMIC | Status: DC | PRN
Start: 2012-01-07 — End: 2012-01-07
  Administered 2012-01-07: 2 [drp] via OPHTHALMIC

## 2012-01-07 MED ORDER — FENTANYL CITRATE 0.05 MG/ML IJ SOLN
INTRAMUSCULAR | Status: AC
Start: 2012-01-07 — End: ?
  Filled 2012-01-07: qty 2

## 2012-01-07 MED ORDER — LIDOCAINE HCL 2 % EX GEL
CUTANEOUS | Status: DC | PRN
Start: 2012-01-07 — End: 2012-01-07
  Administered 2012-01-07: 1 via TOPICAL

## 2012-01-07 MED ORDER — PROPOFOL INFUSION 10 MG/ML
INTRAVENOUS | Status: DC | PRN
Start: 2012-01-07 — End: 2012-01-07
  Administered 2012-01-07: 200 mg via INTRAVENOUS

## 2012-01-07 SURGICAL SUPPLY — 24 items
ANTERIOR CANNULA (Opthamology Supply) ×1 IMPLANT
APPLICATOR COTTON TIP STRL 6IN (Prep) ×1 IMPLANT
CLOSURE STERI-STRIP 1X5IN (Dressing) ×1 IMPLANT
CLOTH BEACON TIMEOUT ORANGE (Other) ×1 IMPLANT
CORD BIPOLAR STRL DISP (Cautery) ×1 IMPLANT
DRAPE 225MM HEAD COVER EQUIPMENT BIOM STERILE DISPOSABLE (Drape) ×1 IMPLANT
DRAPE EQP 225MM BIOM STRL HD CVR DISP (Drape) ×1
ERASER HEMOSTATIC TPERED 23G (Cautery) IMPLANT
GOWN SMART IMPERVIOUS LARGE (Gown) ×1 IMPLANT
KIT INFECTION CONTROL CUSTOM (Kits) ×1
KIT INFECTION CONTROL CUSTOM IFOH03 (Kits) ×1 IMPLANT
NEEDLE HPO PP RW PRCSNGL 30GA .5IN LF (Needles) IMPLANT
PROBE HEMOSTATIC ERASER STRAIGHT OD18 GA BIPOLAR HOLLOW CORE TARGET (Cautery) ×1 IMPLANT
PROBE HMERSR STRG WTFLD ERS 18GA BP HLW (Cautery) ×1
SOLUTION IRRIGATION 0.9% BALANCED SALT (Opthamology Supply) ×1
SOLUTION IRRIGATION 0.9% BALANCED SALT 15 ML (Opthamology Supply) ×1 IMPLANT
SPEAR EYE SURGICAL (Sponge) ×2 IMPLANT
SUTURE COATED VICRYL 7-0 TG160-8 L18 IN (Suture) ×1
SUTURE COATED VICRYL 7-0 TG160-8 L18 IN 2 ARM BRAID COATED VIOLET (Suture) ×1 IMPLANT
SUTURE ETHILON 10-0 TG16 12IN (Suture) ×1 IMPLANT
SUTURE VICRYL 10-0 CS160-6 L12 IN 2 ARM (Suture) ×1
SUTURE VICRYL 10-0 CS160-6 L12 IN 2 ARM MONOFILAMENT COATED VIOLET (Suture) ×1 IMPLANT
SUTURE VICRYL 8-0 9IN V130-4 (Suture) IMPLANT
WATER STERILE PLASTIC POUR BOTTLE 250 ML (Irrigation Solutions) ×1 IMPLANT

## 2012-01-07 NOTE — Anesthesia Postprocedure Evaluation (Signed)
Anesthesia Post Evaluation    Patient: [name]    Procedures performed: Procedure(s) with comments:  TRABECULECTOMY - RIGHT EYE TRABECULECTOMY  ANES=MAC; EQUIP=Mitomycin C 0.4mg /cc/Dispense 5cc; MD REQ=24min; Q1=N; ALLRGS=Casopt,Azopt,Alphagan-P    Anesthesia type: General LMA    Patient location:Phase I PACU    Last vitals:   Filed Vitals:    01/07/12 1630   BP: 124/83   Pulse:    Temp:    Resp: 16   SpO2:        Post pain: Patient not complaining of pain, continue current therapy     Mental Status:awake    Respiratory Function: tolerating nasal cannula    Cardiovascular: stable    Nausea/Vomiting: patient not complaining of nausea or vomiting    Hydration Status: adequate    Post assessment: no apparent anesthetic complications        [id number]

## 2012-01-07 NOTE — Discharge Instructions (Signed)
Discharge Instructions for Glaucoma Patients     Rest today -You may experience dizziness or sleepiness.  Do not drive home. You should not operate a motor vehicle, machinery or power tools for at least 24 hours. DO NOT stay alone for 24 hours.  Stop using all glaucoma eye drops and eye medications that you used before surgery in the operated eye.  Continue using all drops that you were using in the un-operated eye before surgery and discontinue any oral medications prescribed for lowering your eye pressure (Acetazolamide, Methazolamide, Diamox or Neptazane ).  Remove the patch when you wake up tomorrow morning unless otherwise instructed by your Doctor.  After removing the patch, place one drop of the antibiotic drop (tan top) in the operated eye followed by the Prednisolone Acetate eye drop (Pink Top) . If you were prescribed the Atropine drop (Red Top) with you, use that as well. Please remember to wait 5 minutes between each of these drops.  Use Acetaminophen (Tylenol) for pain if needed. Take 2 tablets (325mg or 500mg) every 6 hours as needed.   If you have severe pain that is not relieved with Acetaminophen, please call the doctor.  Please refrain from any exertion, including bending, straining, lifting greater than 10 pounds, hair salons, dental procedures and sexual activity for at least 3 weeks or as otherwise instructed by the Doctor.    The following are common symptoms after surgery:  ? Feeling as if something is in your eye  ? Blurred vision  ? Redness of the eye  ? Bruising of the skin of the eyelid.  ? A "bubble"or clear area at the top of the colored part of eye (For glaucoma surgery)  Please bring all eye drops and eye shield with you to your post-op appointment.    If you have a question or an emergency, call our office at:  ? During Office Hours (8:30 AM - 5PM): [phone]   ? After Hours: [phone] or [phone] (if no response by answering service within 30 minutes)    Anesthesia: After  Your Surgery  You've just had surgery. During surgery, you received medication called anesthesia to keep you comfortable and pain-free. After surgery, you may experience some pain or nausea. This is normal. Here are some tips for feeling better and recovering after surgery.     Stay on schedule with your medication.   Going Home  Your doctor or nurse will show you how to take care of yourself when you go home. He or she will also answer your questions. Have an adult family member or friend drive you home. For the first 24 hours after your surgery:   Do not drive or use heavy equipment.   Do not make important decisions or sign documents.   Avoid alcohol.   Have someone stay with you, if possible. They can watch for problems and help keep you safe.  Be sure to keep all follow-up doctor's appointments. And rest after your procedure for as long as your doctor tells you to.    Coping with Pain  If you have pain after surgery, pain medication will help you feel better. Take it as directed, before pain becomes severe. Also, ask your doctor or pharmacist about other ways to control pain, such as with heat, ice, and relaxation. And follow any other instructions your surgeon or nurse gives you.    Tips for Taking Pain Medication  To get the best relief possible, remember these points:   Pain   medications can upset your stomach. Taking them with a little food may help.   Most pain relievers taken by mouth need at least 20 to 30 minutes to take effect.   Taking medication on a schedule can help you remember to take it. Try to time your medication so that you can take it before beginning an activity, such as dressing, walking, or sitting down for dinner.   Constipation is a common side effect of pain medications. Drink lots of fluids. Eating fruit and vegetables can also help. Don't take laxatives unless your surgeon has prescribed them.   Mixing alcohol and pain medication can cause dizziness and slow your breathing.  It can even be fatal. Don't drink alcohol while taking pain medication.   Pain medication can slow your reflexes. Don't drive or operate machinery while taking pain medication,    Managing Nausea  Some people have an upset stomach after surgery. This is often due to anesthesia, pain, pain medications, or the stress of surgery. The following tips will help you manage nausea and get good nutrition as you recover. If you were on a special diet before surgery, ask your doctor if you should follow it during recovery. These tips may help:   Don't push yourself to eat. Your body will tell you what to eat and when.   Start off with liquids and soup. They are easier to digest.   Progress to semisolids (mashed potatoes, applesauce, and gelatin) as you feel ready.   Slowly move to solid foods. Don't eat fatty, rich, or spicy foods at first.   Don't force yourself to have three large meals a day. Instead, eat smaller amounts more often.   Take pain medications with a small amount of solid food, such as crackers or toast.    Important:   Prevent blood clots by wiggling your toes and tightening your calf muscle 20 times every hour while awake until you resume normal walking activity.   If unable to urinate in 6-8 hours, call your surgeon or go to the Emergency Department.     Call Your Surgeon If.   You still have pain an hour after taking medication (it may not be strong enough).   You feel too sleepy, dizzy, or groggy (medication may be too strong).   You have side effects like nausea, vomiting, or skin changes (rash, itching, or hives).   Signs of infection - fever over 101, chills. Incision site redness, warmth, swelling, foul odor or purulent drainage.   Persistent bleeding at the operative site.    2000-2011 [employer], [address], [place] [address]. All rights reserved. This information is not intended as a substitute for professional medical care. Always follow your healthcare  professional's instructions.

## 2012-01-07 NOTE — Op Note (Signed)
opFULL OPERATIVE NOTE    Date Time: 01/07/2012 4:29 PM  Patient Name: [name]  Attending Physician: Lamount Cranker, MD      Date of Operation:   01/07/2012    Providers Performing:   Surgeon(s):  Shela Leff I, MD    Assistant (s):none    Operative Procedure:   Procedure(s):  TRABECULECTOMY Right Eye    Preoperative Diagnosis:   Pre-Op Diagnosis Codes:     * Primary open-angle glaucoma [365.11, 365.70]    Postoperative Diagnosis:   Primary open-angle glaucoma    Indications:   Eye pressure too high for optic nerve    Operative Notes:   After appropriate informed consent was obtained, the patient was taken to the operating room, placed in supine position.The  Right eye was prepped and draped in usual sterile fashion for ophthalmic  Surgery after general anesthesia was induced. A lid speculum was placed. A 7-0 vicryl clear corneal traction suture was placed superiorly.  A conjunctival incision was made at the limbus superonasally.  A subtenons injection of Mitomycin C (0.4mg /cc) in 1:1 mixture with Lidocaine with Epinephrine was given. A conjunctival peritomy was performed at the limbus and bipolar cautery was used as needed for hemostasis.  Partial thickness scleral flap was created with superblade and bent crescent knife.  Mitomycin C was irrigated with BSS.  Superblade was used to make a paracentesis and same knife was used to enter hinge of scleral flap followed by sclerotomy with kelly punch, iridectomy and cautery with total of 3 10-0 Nylon sutures to fasten the scleral flap with temporal apical suture in releasable fashion through clear cornea.  Miochol was used to constrict the pupil.  Anterior chamber depth was stable with physiologic pressure returned to the eye.  Overlying Conjunctiva closed with 10-0 vicryl suture in Mattress fashion centrally followed by 2- 10-0 vicryl sutures nasally and temporally through clear cornea. Additional 10-0 Nylon suture was used temporally with 8-0 Vicryl suture  suture nasally.  No wound leaks were found.  A drop of ofloxacin, prednisolone, Atropine and antibiotic ointment followed by Sterile eye pad and shield.  Patient went to the recovery room in stable condition.       Estimated Blood Loss:        Specimens:       Complications:   * No complications entered in OR log *      Signed by: Lamount Cranker, MD

## 2012-01-07 NOTE — Transfer of Care (Signed)
Pt awake, breathing spontaneously, in NAD.  VSS. Report given to RN.

## 2012-01-07 NOTE — H&P (Signed)
HISTORY AND PHYSICAL      Today's Date: 01/07/2012   Patient Name: [name]  Attending Physician: Lamount Cranker, MD    Chief Complaint:Glaucoma right eye     History of Present Illness:   [name] is a 51 y.o. female who  has a past medical history of Seasonal allergies; GERD (gastroesophageal reflux disease); Hypertensive disorder; Chronic ulcerative colitis; Clotting disorder; Deep venous thrombosis of calf (2012); and Glaucoma NEC..  This patient presents to the hospital today to undergo elective surgery due to Glaucoma with progressive visual field loss.      Past Medical History:     Past Medical History   Diagnosis Date   . Seasonal allergies    . GERD (gastroesophageal reflux disease)    . Hypertensive disorder      on meds   . Chronic ulcerative colitis      flare ups/last remicade 12/31/11   . Clotting disorder      DVT LLE 2012   . Deep venous thrombosis of calf 2012     LLL/on xeralto//   . Glaucoma NEC      bilat       Past Surgical History:     Past Surgical History   Procedure Date   . Tonsillectomy    . Ureterectomy left    . Bunionectomy      left foot   . Eye surgery      laser surgery for glaucoma, left trabeculectomy       Family History:     Family History   Problem Relation Age of Onset   . Hypertension Father    . Vision loss Father    . Vision loss Brother    . Hypertension Paternal Aunt    . Vision loss Paternal Uncle        Social History:     History     Social History   . Marital Status: Married     Spouse Name: N/A     Number of Children: N/A   . Years of Education: N/A     Social History Main Topics   . Smoking status: Former Smoker     Quit date: 12/21/2010   . Smokeless tobacco: Not on file   . Alcohol Use: 1.2 oz/week     2 Cans of beer per week   . Drug Use: No   . Sexually Active: Yes -- Female partner(s)     Birth Control/ Protection: None     Other Topics Concern   . Not on file     Social History Narrative   . No narrative on file       Allergies:     Allergies    Allergen Reactions   . Cosopt Other (See Comments)     Swelling & red crusty eye   . Latex Itching   . Alphagan P Other (See Comments)     Eyes get red   . Azopt (Brinzolamide) Other (See Comments)     Not sure of reaction   . Seasonal Other (See Comments)     Environmental/congestion/takes allergy shots       Medications:     Prior to Admission medications    Medication Sig Start Date End Date Taking? Authorizing Provider   bimatoprost (LUMIGAN) 0.03 % ophthalmic drops Place 1 drop into both eyes nightly.   Yes [provider]   calcium gluconate 500 MG tablet Take 1,000 mg by mouth daily.  Yes [provider]   desloratadine (CLARINEX) 5 MG tablet Take 5 mg by mouth daily.   Yes [provider]   esomeprazole (NEXIUM) 20 MG capsule Take 20 mg by mouth every morning before breakfast.   Yes [provider]   mesalamine (LIALDA) 1.2 G EC tablet Take 1,200 mg by mouth every morning with breakfast.   Yes [provider]   metoprolol (LOPRESSOR) 100 MG tablet Take 100 mg by mouth daily.   Yes [provider]   Multiple Vitamin (MULTIVITAMIN) capsule Take 1 capsule by mouth daily.   Yes [provider]   Probiotic Product (TRUBIOTICS PO) Take 1 tablet by mouth daily.   Yes [provider]   rivaroxaban (XARELTO) 20 MG TABS Take 20 mg by mouth daily.   Yes [provider]   timolol (TIMOPTIC) 0.5 % ophthalmic solution Place 1 drop into both eyes 2 (two) times daily.   Yes [provider]       Review of Systems:   As per the HPI.  The patient denies any additional changes to their otic, opthalmologic, dermatologic, pulmonary, cardiac, gastrointestinal, genitourinary, musculoskeletal, hematologic, constitutional, or psychiatric systems.    Physical Exam:     Filed Vitals:    01/07/12 1340   BP: 135/90   Pulse: 73   Temp: 97.9 F (36.6 C)   Resp: 16   SpO2: 100%     General appearance - alert, well appearing, and in no  distress  Chest - clear to auscultation, no wheezes, rales or rhonchi, symmetric air entry  Heart - normal rate, regular rhythm, normal S1, S2, no murmurs, rubs, clicks or gallops  Abdomen - soft, nontender, nondistended, no masses or organomegaly  Extremities - peripheral pulses normal, no pedal edema, no clubbing or cyanosis    Labs:     Results     ** No Results found for the last 24 hours. **          Rads:     Radiology Results (24 Hour)     ** No Results found for the last 24 hours. **          Impression:     Patient Active Problem List   Diagnosis   . Low tension open-angle glaucoma       Plan:   Trabeculectomy with [facility] right eye.      Signed by: Lamount Cranker, MD

## 2012-01-07 NOTE — Anesthesia Preprocedure Evaluation (Signed)
Anesthesia Evaluation    AIRWAY    Mallampati: II    TM distance: >3 FB  Neck ROM: full  Mouth Opening:full   CARDIOVASCULAR    cardiovascular exam normal, regular and normal     DENTAL    No notable dental hx     PULMONARY    pulmonary exam normal and clear to auscultation     OTHER FINDINGS              Anesthesia Plan    ASA 2   MAC   Detailed anesthesia plan: MAC  Monitors/Adjuncts: other  Post Op: other  Post op pain management: per surgeon  intravenous induction   informed consent obtained    Plan discussed with CRNA.

## 2012-02-17 MED ORDER — ACETAMINOPHEN 325 MG PO TABS
650.00 mg | ORAL_TABLET | Freq: Once | ORAL | Status: AC
Start: 2012-02-18 — End: 2012-02-18
  Administered 2012-02-18: 650 mg via ORAL
  Filled 2012-02-17: qty 2

## 2012-02-17 MED ORDER — METHYLPREDNISOLONE SODIUM SUCC 40 MG IJ SOLR
25.00 mg | Freq: Once | INTRAMUSCULAR | Status: DC | PRN
Start: 2012-02-18 — End: 2012-02-19

## 2012-02-17 MED ORDER — DIPHENHYDRAMINE HCL 50 MG/ML IJ SOLN
50.00 mg | Freq: Once | INTRAMUSCULAR | Status: DC
Start: 2012-02-18 — End: 2012-02-19

## 2012-02-17 MED ORDER — DIPHENHYDRAMINE HCL 50 MG/ML IJ SOLN
25.00 mg | Freq: Once | INTRAMUSCULAR | Status: AC
Start: 2012-02-18 — End: 2012-02-18
  Administered 2012-02-18: 25 mg via INTRAVENOUS
  Filled 2012-02-17: qty 1

## 2012-02-17 MED ORDER — DIPHENHYDRAMINE HCL 50 MG/ML IJ SOLN
50.00 mg | Freq: Once | INTRAMUSCULAR | Status: DC | PRN
Start: 2012-02-18 — End: 2012-02-19

## 2012-02-17 MED ORDER — INFLIXIMAB 100 MG IV SOLR
300.00 mg | Freq: Once | INTRAVENOUS | Status: AC
Start: 2012-02-18 — End: 2012-02-18
  Administered 2012-02-18: 300 mg via INTRAVENOUS
  Filled 2012-02-17: qty 300

## 2012-02-17 MED ORDER — DIPHENHYDRAMINE HCL 25 MG PO CAPS
50.00 mg | ORAL_CAPSULE | Freq: Once | ORAL | Status: DC
Start: 2012-02-18 — End: 2012-02-19

## 2012-02-17 MED ORDER — DIPHENHYDRAMINE HCL 25 MG PO CAPS
25.00 mg | ORAL_CAPSULE | Freq: Once | ORAL | Status: DC
Start: 2012-02-18 — End: 2012-02-19

## 2012-02-18 ENCOUNTER — Ambulatory Visit
Admission: RE | Admit: 2012-02-18 | Discharge: 2012-02-18 | Disposition: A | Discharge status: Home / Self Care | Payer: BC Managed Care – PPO | Source: Ambulatory Visit | Attending: Gastroenterology | Admitting: Gastroenterology

## 2012-02-18 VITALS — BP 119/87 | HR 61 | Temp 97.5°F | Resp 20 | Ht 63.0 in | Wt 138.0 lb

## 2012-02-18 DIAGNOSIS — K5190 Ulcerative colitis, unspecified, without complications: Secondary | ICD-10-CM

## 2012-02-18 DIAGNOSIS — K5289 Other specified noninfective gastroenteritis and colitis: Secondary | ICD-10-CM | POA: Insufficient documentation

## 2012-02-18 HISTORY — DX: Unspecified ovarian cyst, right side: N83.201

## 2012-02-18 NOTE — Progress Notes (Signed)
9323  Arrived ambulatory to [facility] for a remicade infusion.  No complaints.  Premeds given.  1016 Remicade was started at 10 mL /hour x15 minutes,  then 20 mL/hr x15 minutes,  then 40 mL/hr for 15 minutes,  then 80 mL/hr for 15 min,  then 150 mL/hr for 30 min and  then 250 mL/hr till infusion completed.  1225 Remicade infused without problem.  Patient states she will call MD for problems.  1229 Patient left in NAD, ambulatory.

## 2012-02-18 NOTE — Discharge Instructions (Signed)
Home  Back  SP    Infliximab Solution for injection  What is this medicine?  INFLIXIMAB (in FLIX i mab) is used to treat Crohn's disease and ulcerative colitis. It is also used to treat ankylosing spondylitis, psoriasis, and some forms of arthritis.  This medicine may be used for other purposes; ask your health care provider or pharmacist if you have questions.  What should I tell my health care provider before I take this medicine?  They need to know if you have any of these conditions:   diabetes   exposure to tuberculosis   heart failure   hepatitis or liver disease   immune system problems   infection   lung or breathing disease, like COPD   multiple sclerosis   current or past resident of [place] or [place]   seizure disorder   an unusual or allergic reaction to infliximab, mouse proteins, other medicines, foods, dyes, or preservatives   pregnant or trying to get pregnant   breast-feeding  How should I use this medicine?  This medicine is for injection into a vein. It is usually given by a health care professional in a hospital or clinic setting.  A special MedGuide will be given to you by the pharmacist with each prescription and refill. Be sure to read this information carefully each time.  Talk to your pediatrician regarding the use of this medicine in children. Special care may be needed.  Overdosage: If you think you have taken too much of this medicine contact a poison control center or emergency room at once.  NOTE: This medicine is only for you. Do not share this medicine with others.  What if I miss a dose?  It is important not to miss your dose. Call your doctor or health care professional if you are unable to keep an appointment.  What may interact with this medicine?  Do not take this medicine with any of the following medications:   anakinra   rilonacept  This medicine may also interact with the following medications:   vaccines  This list may not describe all  possible interactions. Give your health care provider a list of all the medicines, herbs, non-prescription drugs, or dietary supplements you use. Also tell them if you smoke, drink alcohol, or use illegal drugs. Some items may interact with your medicine.  What should I watch for while using this medicine?  Visit your doctor or health care professional for regular checks on your progress.  If you get a cold or other infection while receiving this medicine, call your doctor or health care professional. Do not treat yourself. This medicine may decrease your body's ability to fight infections. Before beginning therapy, your doctor may do a test to see if you have been exposed to tuberculosis.  This medicine may make the symptoms of heart failure worse in some patients. If you notice symptoms such as increased shortness of breath or swelling of the ankles or legs, contact your health care provider right away.  If you are going to have surgery or dental work, tell your health care professional or dentist that you have received this medicine.  If you take this medicine for plaque psoriasis, stay out of the sun. If you cannot avoid being in the sun, wear protective clothing and use sunscreen. Do not use sun lamps or tanning beds/booths.  What side effects may I notice from receiving this medicine?  Side effects that you should report to your doctor or  health care professional as soon as possible:   allergic reactions like skin rash, itching or hives, swelling of the face, lips, or tongue   chest pain   fever or chills, usually related to the infusion   muscle or joint pain   red, scaly patches or raised bumps on the skin   signs of infection - fever or chills, cough, sore throat, pain or difficulty passing urine   swollen lymph nodes in the neck, underarm, or groin areas   unexplained weight loss   unusual bleeding or bruising   unusually weak or tired   yellowing of the eyes or skin  Side effects that usually  do not require medical attention (report to your doctor or health care professional if they continue or are bothersome):   headache   heartburn or stomach pain   nausea, vomiting  This list may not describe all possible side effects. Call your doctor for medical advice about side effects. You may report side effects to FDA at 1-800-FDA-1088.  Where should I keep my medicine?  This drug is given in a hospital or clinic and will not be stored at home.  NOTE:This sheet is a summary. It may not cover all possible information. If you have questions about this medicine, talk to your doctor, pharmacist, or health care provider. Copyright 2012 Gold Standard

## 2012-03-17 ENCOUNTER — Ambulatory Visit: Payer: BC Managed Care – PPO

## 2012-03-17 NOTE — Pre-Procedure Instructions (Signed)
Per pt., was given instruction to check in at [facility], posting info indicates [facility]. Posting made aware via inbox and fax sent to [facility] posting. Pt., will also call surgeon's office and clarify the information. Pt., given arrival instructions for [facility] and made aware that if [facility] blue entrance is the way to get there. Per pt., had labs, & pre-op physical done at [facility] on 03/14/12, faxed [facility] dr. Conley Rolls for results, H&P. EKG done on 12/23/11 in epic. Per pt., has received DOS med instructions from surgeon and was told to follow those instructions.

## 2012-03-21 ENCOUNTER — Encounter (HOSPITAL_BASED_OUTPATIENT_CLINIC_OR_DEPARTMENT_OTHER): Payer: Self-pay | Admitting: Anesthesiology

## 2012-03-21 ENCOUNTER — Ambulatory Visit (HOSPITAL_BASED_OUTPATIENT_CLINIC_OR_DEPARTMENT_OTHER): Payer: BC Managed Care – PPO | Admitting: Gynecologic Oncology

## 2012-03-21 ENCOUNTER — Encounter (HOSPITAL_BASED_OUTPATIENT_CLINIC_OR_DEPARTMENT_OTHER)
Admission: RE | Disposition: A | Discharge status: Home / Self Care | Payer: Self-pay | Source: Ambulatory Visit | Attending: Gynecologic Oncology

## 2012-03-21 ENCOUNTER — Ambulatory Visit
Admission: RE | Admit: 2012-03-21 | Discharge: 2012-03-22 | Disposition: A | Discharge status: Home / Self Care | Payer: BC Managed Care – PPO | Source: Ambulatory Visit | Attending: Gynecologic Oncology | Admitting: Gynecologic Oncology

## 2012-03-21 ENCOUNTER — Ambulatory Visit (HOSPITAL_BASED_OUTPATIENT_CLINIC_OR_DEPARTMENT_OTHER): Payer: BC Managed Care – PPO | Admitting: Anesthesiology

## 2012-03-21 ENCOUNTER — Ambulatory Visit: Payer: Self-pay

## 2012-03-21 DIAGNOSIS — I10 Essential (primary) hypertension: Secondary | ICD-10-CM | POA: Insufficient documentation

## 2012-03-21 DIAGNOSIS — D251 Intramural leiomyoma of uterus: Secondary | ICD-10-CM | POA: Insufficient documentation

## 2012-03-21 DIAGNOSIS — N8000 Endometriosis of the uterus, unspecified: Secondary | ICD-10-CM | POA: Insufficient documentation

## 2012-03-21 DIAGNOSIS — D279 Benign neoplasm of unspecified ovary: Secondary | ICD-10-CM | POA: Insufficient documentation

## 2012-03-21 DIAGNOSIS — Z86718 Personal history of other venous thrombosis and embolism: Secondary | ICD-10-CM | POA: Insufficient documentation

## 2012-03-21 LAB — CBC AND DIFFERENTIAL
Basophils Absolute Automated: 0 10*3/uL (ref 0.00–0.20)
Basophils Automated: 0 % (ref 0–2)
Eosinophils Absolute Automated: 0 10*3/uL (ref 0.00–0.70)
Eosinophils Automated: 0 % (ref 0–5)
Hematocrit: 31.9 % — ABNORMAL LOW (ref 37.0–47.0)
Hgb: 9.5 g/dL — ABNORMAL LOW (ref 12.0–16.0)
Immature Granulocytes Absolute: 0.02 10*3/uL
Immature Granulocytes: 0 % (ref 0–1)
Lymphocytes Absolute Automated: 0.79 10*3/uL (ref 0.50–4.40)
Lymphocytes Automated: 8 % — ABNORMAL LOW (ref 15–41)
MCH: 24.9 pg — ABNORMAL LOW (ref 28.0–32.0)
MCHC: 29.8 g/dL — ABNORMAL LOW (ref 32.0–36.0)
MCV: 83.5 fL (ref 80.0–100.0)
MPV: 9.5 fL (ref 9.4–12.3)
Monocytes Absolute Automated: 0.22 10*3/uL (ref 0.00–1.20)
Monocytes: 2 % (ref 0–11)
Neutrophils Absolute: 8.61 10*3/uL — ABNORMAL HIGH (ref 1.80–8.10)
Neutrophils: 89 % — ABNORMAL HIGH (ref 52–75)
Nucleated RBC: 0 /100 WBC (ref 0–1)
Platelets: 305 10*3/uL (ref 140–400)
RBC: 3.82 10*6/uL — ABNORMAL LOW (ref 4.20–5.40)
RDW: 22 % — ABNORMAL HIGH (ref 12–15)
WBC: 9.64 10*3/uL (ref 3.50–10.80)

## 2012-03-21 LAB — CELL MORPHOLOGY
Cell Morphology: ABNORMAL — AB
Platelet Estimate: NORMAL

## 2012-03-21 SURGERY — ROBOT ASSISTED, LAPAROSCOPIC, HYSTERECTOMY, TOTAL
Anesthesia: Anesthesia General | Site: Abdomen | Laterality: Right | Wound class: Clean Contaminated

## 2012-03-21 MED ORDER — GLYCOPYRROLATE 0.2 MG/ML IJ SOLN
INTRAMUSCULAR | Status: DC | PRN
Start: 2012-03-21 — End: 2012-03-21
  Administered 2012-03-21 (×2): .1 mg via INTRAVENOUS
  Administered 2012-03-21: .4 mg via INTRAVENOUS

## 2012-03-21 MED ORDER — FENTANYL CITRATE 0.05 MG/ML IJ SOLN
INTRAMUSCULAR | Status: DC | PRN
Start: 2012-03-21 — End: 2012-03-21
  Administered 2012-03-21: 50 ug via INTRAVENOUS
  Administered 2012-03-21: 100 ug via INTRAVENOUS
  Administered 2012-03-21: 50 ug via INTRAVENOUS

## 2012-03-21 MED ORDER — HYDROMORPHONE HCL PF 1 MG/ML IJ SOLN
INTRAMUSCULAR | Status: AC
Start: 2012-03-21 — End: 2012-03-21
  Administered 2012-03-21: 0.5 mg via INTRAVENOUS
  Filled 2012-03-21: qty 1

## 2012-03-21 MED ORDER — LACTATED RINGERS IV SOLN
INTRAVENOUS | Status: DC | PRN
Start: 2012-03-21 — End: 2012-03-21
  Administered 2012-03-21: 800 mL

## 2012-03-21 MED ORDER — LIDOCAINE HCL 2 % IJ SOLN
INTRAMUSCULAR | Status: DC | PRN
Start: 2012-03-21 — End: 2012-03-21
  Administered 2012-03-21: 100 mg

## 2012-03-21 MED ORDER — SODIUM CHLORIDE 0.9 % IV MBP
1.00 g | Freq: Three times a day (TID) | INTRAVENOUS | Status: AC
Start: 2012-03-21 — End: 2012-03-21
  Administered 2012-03-21: 1 g via INTRAVENOUS

## 2012-03-21 MED ORDER — PROMETHAZINE HCL 25 MG/ML IJ SOLN
6.2500 mg | Freq: Once | INTRAMUSCULAR | Status: DC | PRN
Start: 2012-03-21 — End: 2012-03-21

## 2012-03-21 MED ORDER — ACETAMINOPHEN 325 MG PO TABS
650.0000 mg | ORAL_TABLET | ORAL | Status: DC | PRN
Start: 2012-03-21 — End: 2012-03-22
  Administered 2012-03-22 (×2): 650 mg via ORAL
  Filled 2012-03-21 (×2): qty 2

## 2012-03-21 MED ORDER — LACTATED RINGERS IV SOLN
INTRAVENOUS | Status: DC | PRN
Start: 2012-03-21 — End: 2012-03-21

## 2012-03-21 MED ORDER — FAMOTIDINE 10 MG/ML IV SOLN
INTRAVENOUS | Status: DC | PRN
Start: 2012-03-21 — End: 2012-03-21
  Administered 2012-03-21: 20 mg via INTRAVENOUS

## 2012-03-21 MED ORDER — SODIUM CHLORIDE 0.9 % IV SOLN
INTRAVENOUS | Status: DC
Start: 2012-03-21 — End: 2012-03-22

## 2012-03-21 MED ORDER — HYDROMORPHONE HCL PF 1 MG/ML IJ SOLN
0.5000 mg | INTRAMUSCULAR | Status: DC | PRN
Start: 2012-03-21 — End: 2012-03-21
  Administered 2012-03-21: 0.5 mg via INTRAVENOUS

## 2012-03-21 MED ORDER — FENTANYL CITRATE 0.05 MG/ML IJ SOLN
INTRAMUSCULAR | Status: AC
Start: 2012-03-21 — End: 2012-03-21
  Administered 2012-03-21: 25 ug via INTRAVENOUS
  Filled 2012-03-21: qty 2

## 2012-03-21 MED ORDER — MESALAMINE ER 250 MG PO CPCR
1000.00 mg | ORAL_CAPSULE | Freq: Every day | ORAL | Status: DC
Start: 2012-03-21 — End: 2012-03-22

## 2012-03-21 MED ORDER — DIPHENHYDRAMINE HCL 50 MG/ML IJ SOLN
6.2500 mg | Freq: Four times a day (QID) | INTRAMUSCULAR | Status: DC | PRN
Start: 2012-03-21 — End: 2012-03-21

## 2012-03-21 MED ORDER — MIDAZOLAM HCL 2 MG/2ML IJ SOLN
INTRAMUSCULAR | Status: DC | PRN
Start: 2012-03-21 — End: 2012-03-21
  Administered 2012-03-21: 2 mg via INTRAVENOUS

## 2012-03-21 MED ORDER — ONDANSETRON HCL 4 MG/2ML IJ SOLN
8.0000 mg | Freq: Three times a day (TID) | INTRAMUSCULAR | Status: DC | PRN
Start: 2012-03-21 — End: 2012-03-22

## 2012-03-21 MED ORDER — FAMOTIDINE 10 MG/ML IV SOLN
20.0000 mg | Freq: Two times a day (BID) | INTRAVENOUS | Status: DC
Start: 2012-03-21 — End: 2012-03-22
  Administered 2012-03-21: 20 mg via INTRAVENOUS
  Filled 2012-03-21: qty 2

## 2012-03-21 MED ORDER — HYDROCORTISONE SOD SUCCINATE 100 MG IJ SOLR
100.00 mg | Freq: Three times a day (TID) | INTRAMUSCULAR | Status: AC
Start: 2012-03-21 — End: 2012-03-22
  Administered 2012-03-21 – 2012-03-22 (×3): 100 mg via INTRAVENOUS
  Filled 2012-03-21 (×2): qty 2

## 2012-03-21 MED ORDER — ONDANSETRON HCL 4 MG/2ML IJ SOLN
4.0000 mg | Freq: Once | INTRAMUSCULAR | Status: DC | PRN
Start: 2012-03-21 — End: 2012-03-21

## 2012-03-21 MED ORDER — PROPOFOL INFUSION 10 MG/ML
INTRAVENOUS | Status: DC | PRN
Start: 2012-03-21 — End: 2012-03-21
  Administered 2012-03-21: 150 mg via INTRAVENOUS

## 2012-03-21 MED ORDER — NALOXONE HCL 0.4 MG/ML IJ SOLN
0.2000 mg | INTRAMUSCULAR | Status: DC | PRN
Start: 2012-03-21 — End: 2012-03-22

## 2012-03-21 MED ORDER — CETIRIZINE HCL 1 MG/ML PO SYRP
2.50 mg | ORAL_SOLUTION | Freq: Every day | ORAL | Status: DC
Start: 2012-03-21 — End: 2012-03-22

## 2012-03-21 MED ORDER — METOPROLOL TARTRATE 50 MG PO TABS
100.00 mg | ORAL_TABLET | Freq: Every day | ORAL | Status: DC
Start: 2012-03-21 — End: 2012-03-22
  Filled 2012-03-21 (×3): qty 2

## 2012-03-21 MED ORDER — ONDANSETRON HCL 4 MG/2ML IJ SOLN
INTRAMUSCULAR | Status: DC
Start: 2012-03-21 — End: 2012-03-21
  Filled 2012-03-21: qty 2

## 2012-03-21 MED ORDER — MEPERIDINE HCL 25 MG/ML IJ SOLN
25.0000 mg | INTRAMUSCULAR | Status: DC | PRN
Start: 2012-03-21 — End: 2012-03-21

## 2012-03-21 MED ORDER — SODIUM CHLORIDE 0.9 % IR SOLN
Status: DC | PRN
Start: 2012-03-21 — End: 2012-03-21
  Administered 2012-03-21: 2000 mL

## 2012-03-21 MED ORDER — OXYCODONE-ACETAMINOPHEN 5-325 MG PO TABS
1.0000 | ORAL_TABLET | Freq: Once | ORAL | Status: AC | PRN
Start: 2012-03-21 — End: 2012-03-21

## 2012-03-21 MED ORDER — ENOXAPARIN SODIUM 40 MG/0.4ML SC SOLN
40.00 mg | Freq: Once | SUBCUTANEOUS | Status: AC
Start: 2012-03-21 — End: 2012-03-21
  Administered 2012-03-21: 40 mg via SUBCUTANEOUS

## 2012-03-21 MED ORDER — CEFAZOLIN SODIUM 1 G IJ SOLR
1.00 g | INTRAMUSCULAR | Status: DC
Start: 2012-03-21 — End: 2012-03-21

## 2012-03-21 MED ORDER — CEFAZOLIN SODIUM 1 G IJ SOLR
INTRAMUSCULAR | Status: AC
Start: 2012-03-21 — End: ?
  Filled 2012-03-21: qty 1000

## 2012-03-21 MED ORDER — LACTATED RINGERS IV SOLN
INTRAVENOUS | Status: DC
Start: 2012-03-21 — End: 2012-03-21

## 2012-03-21 MED ORDER — NEOSTIGMINE METHYLSULFATE 1 MG/ML IJ SOLN
INTRAMUSCULAR | Status: DC | PRN
Start: 2012-03-21 — End: 2012-03-21
  Administered 2012-03-21: 3 mg via INTRAVENOUS

## 2012-03-21 MED ORDER — HYDROMORPHONE HCL 2 MG PO TABS
2.0000 mg | ORAL_TABLET | ORAL | Status: DC | PRN
Start: 2012-03-21 — End: 2012-03-22
  Administered 2012-03-22 (×2): 2 mg via ORAL
  Filled 2012-03-21 (×2): qty 1

## 2012-03-21 MED ORDER — ENOXAPARIN SODIUM 40 MG/0.4ML SC SOLN
SUBCUTANEOUS | Status: AC
Start: 2012-03-21 — End: ?
  Filled 2012-03-21: qty 0.4

## 2012-03-21 MED ORDER — PHENYLEPHRINE 100 MCG/ML IV BOLUS (ANESTHESIA)
PREFILLED_SYRINGE | INTRAVENOUS | Status: DC | PRN
Start: 2012-03-21 — End: 2012-03-21
  Administered 2012-03-21 (×2): 100 ug via INTRAVENOUS

## 2012-03-21 MED ORDER — BUPIVACAINE HCL 0.25 % IJ SOLN
INTRAMUSCULAR | Status: DC | PRN
Start: 2012-03-21 — End: 2012-03-21
  Administered 2012-03-21: 20 mL

## 2012-03-21 MED ORDER — OXYCODONE-ACETAMINOPHEN 5-325 MG PO TABS
ORAL_TABLET | ORAL | Status: AC
Start: 2012-03-21 — End: 2012-03-21
  Administered 2012-03-21: 1 via ORAL
  Filled 2012-03-21: qty 1

## 2012-03-21 MED ORDER — HYDROMORPHONE HCL PF 1 MG/ML IJ SOLN
0.2000 mg | INTRAMUSCULAR | Status: DC | PRN
Start: 2012-03-21 — End: 2012-03-22

## 2012-03-21 MED ORDER — FENTANYL CITRATE 0.05 MG/ML IJ SOLN
25.0000 ug | INTRAMUSCULAR | Status: AC | PRN
Start: 2012-03-21 — End: 2012-03-21
  Administered 2012-03-21 (×3): 25 ug via INTRAVENOUS

## 2012-03-21 MED ORDER — ONDANSETRON HCL 4 MG/2ML IJ SOLN
INTRAMUSCULAR | Status: DC | PRN
Start: 2012-03-21 — End: 2012-03-21
  Administered 2012-03-21: 4 mg via INTRAVENOUS

## 2012-03-21 MED ORDER — PREDNISONE 20 MG PO TABS
40.00 mg | ORAL_TABLET | Freq: Every day | ORAL | Status: DC
Start: 2012-03-22 — End: 2012-03-22
  Filled 2012-03-21 (×2): qty 2

## 2012-03-21 MED ORDER — PROMETHAZINE HCL 25 MG/ML IJ SOLN
12.5000 mg | Freq: Four times a day (QID) | INTRAMUSCULAR | Status: DC | PRN
Start: 2012-03-21 — End: 2012-03-22

## 2012-03-21 MED ORDER — STERILE WATER FOR IRRIGATION IR SOLN
Status: DC | PRN
Start: 2012-03-21 — End: 2012-03-21
  Administered 2012-03-21: 1000 mL

## 2012-03-21 MED ORDER — ROCURONIUM BROMIDE 50 MG/5ML IV SOLN
INTRAVENOUS | Status: DC | PRN
Start: 2012-03-21 — End: 2012-03-21
  Administered 2012-03-21: 40 mg via INTRAVENOUS

## 2012-03-21 SURGICAL SUPPLY — 25 items
GLOVE SURG BIOGEL INDIC SZ 6.5 (Glove) ×3 IMPLANT
KIT CLOSURE PROCEDURE (Suture) ×3 IMPLANT
NEEDLE INSUFFLATION 120MM (Needles) ×3 IMPLANT
SLIPCOVER LAP-HUG-U-VAC LAP-S (Sterilization Supply) ×3 IMPLANT
STRIP SKIN CLOSURE L4 IN X W1/2 IN (Dressing) ×2
STRIP SKIN CLOSURE L4 IN X W1/2 IN REINFORCE STERI-STRIP POLYESTER (Dressing) ×2 IMPLANT
STRIP SKNCLS PLSTR STRSTRP 4X.5IN LF (Dressing) ×1
SUTURE ABS 1 CT1 PDS2 36IN MFL VIOL (Suture) ×2
SUTURE ABS 1 CT1 VCL 27IN BRD COAT VIOL (Suture) ×1
SUTURE ABS 1 CT1 VCL 36IN BRD COAT VIOL (Suture) ×1
SUTURE COATED VICRYL 1 CT-1 L27 IN BRAID (Suture) ×2
SUTURE COATED VICRYL 1 CT-1 L27 IN BRAID COATED VIOLET ABSORBABLE (Suture) ×2 IMPLANT
SUTURE COATED VICRYL 1 CT-1 L36 IN BRAID (Suture) ×2
SUTURE COATED VICRYL 1 CT-1 L36 IN BRAID COATED VIOLET ABSORBABLE (Suture) ×2 IMPLANT
SUTURE PDS II 1 CT-1 L36 IN MONOFILAMENT (Suture) ×4
SUTURE PDS II 1 CT-1 L36 IN MONOFILAMENT VIOLET ABSORBABLE (Suture) ×4 IMPLANT
SUTURE VICRYL 012X18IN (Suture) ×3 IMPLANT
SUTURE VICRYL 4-0 PS2 27IN (Suture) ×6 IMPLANT
SYSTEM IMAGING 8X6IN CLEARIFY MICROFIBER WARM HUB TRCR WIPE DSPSBL (Kits) ×2 IMPLANT
SYSTEM IMG MRFBR CLEARIFY 8X6IN WRM HUB (Kits) ×3
TIP CAUT 8MM STD HOT SHR DVNC EWRST CAUT (Procedure Accessories) ×3
TIP ELECTROCAUTERY HOT SHEARS DA VINCI ENDOWRIST CAUTERY MONOPOLAR 8 (Procedure Accessories) ×2 IMPLANT
TRAY DAVINCI GYN PACK (Pack) ×3 IMPLANT
TROCAR BLADELES 8X100MM (Laparoscopy Supplies) ×3 IMPLANT
TROCAR BLADELESS ENDO 12X10MM (Laparoscopy Supplies) ×6 IMPLANT

## 2012-03-21 NOTE — Discharge Instructions (Signed)
Instructions for Patients After Surgery    Activities and Limitations:    Avoid prolonged standing and lifting anything heaver than 15 pounds for 4 weeks after surgery. When lifting, do not bend from the waist. Bend the knees and squat, lift by lifting your leg muscles rather than you back muscles.    Gradually increase activity    Keep incision clean and dry. Wash the area with warm water gently pat dry.    May shower and wash hair - 24 hours after surgery    Go up and down stairs one at a time    Abstain from sexual intercourse, couching, using tampons, tub baths and swimming for 6 weeks.    Avoid sexual intercourse while the skin is irritated.    Wear light, loosely fitting clothes; cotton underwear.    Do not drive while on narcotic pain medications.    TREATMENT OF WOUNDS AND SORE AREAS AT THE OUTSIDE OF THE VAGINA (VULVA) AND/OR RECTAL OUTLET (ANUS)    Sitz Baths:   Sitz baths (bath tub is ok) in luke warm water for 10-15 minutes - 2 times per                    day.   Use squirt bottle after each bowel movement and voiding  Dry treated areas with soft paper towel (blot!) Even better: use electric hair dryer at cold setting.    Diet:   No restrictions - as tolerated.   Bland small meals initially.    What to Expect:    Discomfort, wound pain. Use ice pack, rest and pain medication    Feeling tired. Get enough rest. Increase activity gradually.    Brownish or slightly bloody vaginal discharge for 3 weeks.    Complications to Call For:    Temperature over 101 degrees.    Vaginal bleeding greater than a heavy period (bright red bledding).    Vaginal discharge with a foul odor.    Persistent nausea and vomiting.    Medications:    Pain: Take Tylenol, or Motrin. Take Percocet, Dilaudid, or Vicoden for severe pain as prescribed.    Constipation (over the counter):  o Stool Softener: Colace 1-2 capules or tablets 1-2 times per day.  o Laxative: Miralax 17g 1-2 times per day.    Gas:  o Simethicone 80 mg every 6  hours as needed.    Resume medication you took prior to surgery.    Follow-up:  10 days after the date of surgery - make an appointment before you leave the office or call the office for an appointment.

## 2012-03-21 NOTE — Brief Op Note (Signed)
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]    Postoperative Diagnosis:   * No post-op diagnosis entered *    Anesthesia:   General    Estimated Blood Loss:      EBL 50 cc  UOP 25 cc  IV fluids 1000 cc    Implants:   * No implants in log *    Drains:   Drains: no    Specimens:        SPECIMENS (last 24 hours)      Non-Carefusion Specimens     Row Name 03/21/12 1400                Specimen Information    Specimen Testing Required Cytology     Specimen Description pelvic washings     Specimen Information    Specimen Testing Required Routine Pathology;Frozen Section     Specimen ID (letter) a     Specimen Description uterus, cervix, right tube and ovary and partial left tube           Findings:   Normal uterus, normal left ovary, small paratubal left cyst, enlarged right ovarian cyst ~ 10 cm, clear fluid, normal right tube    Complications:   none    Signed by: Russella Dar, MD                                                                           [facility]

## 2012-03-21 NOTE — Transfer of Care (Signed)
Anesthesia Transfer of Care Note    Patient: [name]    Procedures performed: Procedure(s) with comments:  ROBOT ASSISTED, LAPAROSCOPIC, HYSTERECTOMY, TOTAL - and right salpingo - oophorectomy, and partial left salpingectomy    Anesthesia type: General ETT    Patient location:Phase I PACU    Last vitals:   Filed Vitals:    03/21/12 1514   BP: 146/80   Pulse: 56   Temp: 97.9 F (36.6 C)   Resp: 14   SpO2: 100%       Post pain: Patient not complaining of pain, continue current therapy     Mental Status:sedated    Respiratory Function: tolerating face mask    Cardiovascular: stable    Nausea/Vomiting: patient not complaining of nausea or vomiting    Hydration Status: adequate    Post assessment: no apparent anesthetic complications

## 2012-03-21 NOTE — H&P (Signed)
I have reviewed the H&P and examined the patient. There are no changes in her H&P.

## 2012-03-21 NOTE — Op Note (Signed)
FULL OPERATIVE NOTE    Date Time: 03/21/2012 4:51 PM  Patient Name: [name]  Attending Physician: Parke Poisson, MD      Date of Operation:   03/21/2012    Providers Performing:   Surgeon(s):  Parke Poisson, MD    Assistant (s): Dr. 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 SCDs were started prior to anesthesia administration. She was then prepped and draped in the usual sterile manner. A sterile speculum was inserted in the vagina and the colpo-probe was inserted in the usual manner for uterine manipulation. A foley was also inserted in a sterile manner. Then the attention was turned to the abdomen. A supraumbilical vertical 12 mm incision was made. Veress needle was inserted and pneumoperitoneum was obtained with CO2 gas insufflation. 12 mm trocar and sleeve were inserted. Intraperitoneal access was gained and confirmed with the camera. Now under direct visualization, LMQ 8 mm ports x 2 and RMQ 8 mm port x 1 were placed along with RUQ 12 mm port x1. Trendelenberg was obtained and bowel was moved out of the pelvis. Robot was docked in the usual manner. Washings were obtained and sent off for cytology.     Now bilateral round ligaments were identified, cauterized and transected. Bilateral retroperitoneal dissection was carried out and bilateral ureters were identified. Right IP and left uteroovarian ligaments were isolated, cauterized and transected. Vesicouterine peritoneum was carefully dissected off and the bladder was dissected off the anterior  lower uterine segment. Now bilateral uterine arteries were skeletonized, cauterized and transected. Bilateral cardinals were cauterized and transected. Colpotomy was performed and the specimen was removed transvaginally.  Vaginal cuff was closed in a running fashion with #1 vicryl suture. Good closure and hemostasis was noted after irrigation.    Left fallopian tube cyst was noted at the fimbriated end and this end was resected.     All instruments were removed. Robot was undocked. Fascia was closed at the 12 mm port sites using the carter-thomason and 0-vicryl sutures.    Skin was closed at all port sites with 4-0 vicryl in a subcuticular manner. 0.25% marcaine without epi was administered at all port sites. There were no complications and all counts were correct. I was present and scrubbed through the entire procedure.       Estimated Blood Loss:    min    Specimens:        SPECIMENS (last 24 hours)      Non-Carefusion Specimens     Row Name 03/21/12 1400                Specimen Information    Specimen Testing Required Cytology     Specimen Description pelvic washings     Specimen Information    Specimen Testing Required Routine Pathology;Frozen Section     Specimen ID (letter) a     Specimen Description uterus, cervix, right tube and ovary and partial left tube  Complications:   * No complications entered in OR log *      Signed by: Parke Poisson, MD

## 2012-03-21 NOTE — PACU (Signed)
Spoke with Dr. Helene Kelp regarding HR in the 40's and BP 114/70.  Patient's pre-op HR - 68 and BP - 153/86.  Patient asymptomatic.  Dr. Helene Kelp ok with vitals at this time and will stop by to see patient.

## 2012-03-21 NOTE — Anesthesia Preprocedure Evaluation (Addendum)
Anesthesia Evaluation    AIRWAY      Neck ROM: full  Mouth Opening:full   CARDIOVASCULAR    regular     DENTAL         PULMONARY         OTHER FINDINGS              Anesthesia Plan    ASA 3   general         Post op pain management: per surgeon  informed consent obtained    Plan discussed with CRNA.             Allergies:  Allergies   Allergen Reactions   . Cosopt Other (See Comments)     Swelling & red crusty eye   . Latex Itching   . Alphagan P Other (See Comments)     Eyes get red   . Azopt (Brinzolamide) Other (See Comments)     Not sure of reaction   . Seasonal Other (See Comments)     Environmental/congestion/takes allergy shots     All Rx:  Scheduled Meds:     Continuous Infusions:     PRN Meds:.     Problem List:  Patient Active Problem List    Diagnosis Date Noted   . Low tension open-angle glaucoma 01/07/2012     History:  Past Medical History   Diagnosis Date   . Seasonal allergies    . GERD (gastroesophageal reflux disease)    . Hypertensive disorder      on meds   . Chronic ulcerative colitis      flare ups/last remicade 12/31/11   . Clotting disorder      DVT LLE 2012   . Deep venous thrombosis of calf 2012     LLL/on xeralto//   . Glaucoma NEC      bilat   . Ovarian cyst, right      Past Surgical History   Procedure Date   . Tonsillectomy    . Ureterectomy left    . Bunionectomy      left foot   . Eye surgery      laser surgery for glaucoma, left trabeculectomy     Labs    Lab Results   Component Value Date    WBC 9.00 01/02/2011    HGB 11.9* 01/02/2011    HCT 35.1* 01/02/2011    PLT 404* 01/02/2011    ALT 20 12/29/2010    AST 18 12/29/2010    NA 139 12/29/2010    K 4.3 12/29/2010    CL 107 12/29/2010    CO2 23 12/29/2010    CREAT 0.6 12/29/2010    BUN 6* 12/29/2010    PT 16.7* 01/02/2011    PTT 59* 12/31/2010    INR 1.4* 01/02/2011    GLU 82 12/29/2010

## 2012-03-22 LAB — CBC AND DIFFERENTIAL
Basophils Absolute Automated: 0 10*3/uL (ref 0.00–0.20)
Basophils Automated: 0 % (ref 0–2)
Eosinophils Absolute Automated: 0 10*3/uL (ref 0.00–0.70)
Eosinophils Automated: 0 % (ref 0–5)
Hematocrit: 27 % — ABNORMAL LOW (ref 37.0–47.0)
Hgb: 8.2 g/dL — ABNORMAL LOW (ref 12.0–16.0)
Immature Granulocytes Absolute: 0.01 10*3/uL
Immature Granulocytes: 0 % (ref 0–1)
Lymphocytes Absolute Automated: 0.73 10*3/uL (ref 0.50–4.40)
Lymphocytes Automated: 10 % — ABNORMAL LOW (ref 15–41)
MCH: 24.6 pg — ABNORMAL LOW (ref 28.0–32.0)
MCHC: 30.4 g/dL — ABNORMAL LOW (ref 32.0–36.0)
MCV: 80.8 fL (ref 80.0–100.0)
MPV: 9.6 fL (ref 9.4–12.3)
Monocytes Absolute Automated: 0.37 10*3/uL (ref 0.00–1.20)
Monocytes: 5 % (ref 0–11)
Neutrophils Absolute: 6.1 10*3/uL (ref 1.80–8.10)
Neutrophils: 85 % — ABNORMAL HIGH (ref 52–75)
Nucleated RBC: 0 /100 WBC (ref 0–1)
Platelets: 294 10*3/uL (ref 140–400)
RBC: 3.34 10*6/uL — ABNORMAL LOW (ref 4.20–5.40)
RDW: 22 % — ABNORMAL HIGH (ref 12–15)
WBC: 7.21 10*3/uL (ref 3.50–10.80)

## 2012-03-22 LAB — BASIC METABOLIC PANEL
BUN: 11 mg/dL (ref 7.0–19.0)
CO2: 22 mEq/L (ref 22–29)
Calcium: 8 mg/dL — ABNORMAL LOW (ref 8.5–10.5)
Chloride: 107 mEq/L (ref 98–107)
Creatinine: 0.8 mg/dL (ref 0.6–1.0)
Glucose: 149 mg/dL — ABNORMAL HIGH (ref 70–100)
Potassium: 4 mEq/L (ref 3.5–5.1)
Sodium: 137 mEq/L (ref 136–145)

## 2012-03-22 LAB — GFR: EGFR: >60

## 2012-03-22 MED ORDER — PATIENT SUPPLIED NON FORMULARY
20.00 mg | Freq: Every day | Status: DC
Start: 2012-03-22 — End: 2012-03-22

## 2012-03-22 MED ORDER — MESALAMINE ER 250 MG PO CPCR
2400.00 mg | ORAL_CAPSULE | Freq: Every day | ORAL | Status: DC
Start: 2012-03-22 — End: 2012-03-22

## 2012-03-22 MED ORDER — HYDROMORPHONE HCL 2 MG PO TABS
2.00 mg | ORAL_TABLET | ORAL | Status: AC | PRN
Start: 2012-03-22 — End: 2012-04-01

## 2012-03-22 MED ORDER — DOCUSATE SODIUM 100 MG PO CAPS
100.00 mg | ORAL_CAPSULE | Freq: Two times a day (BID) | ORAL | Status: AC
Start: 2012-03-22 — End: 2012-04-01

## 2012-03-22 MED ORDER — MESALAMINE ER 250 MG PO CPCR
1000.00 mg | ORAL_CAPSULE | Freq: Every day | ORAL | Status: DC
Start: 2012-03-22 — End: 2012-03-22

## 2012-03-22 NOTE — Progress Notes (Signed)
Seen by gyn team this am, lap site dry and intact, pt voided after foley catheter removed medicated with dilaudid 2mg  po for pain with good relief, iv saline lock discontinued, ok'd for discharge,dischaarged instruction written rx given for dilaudid,drug information provided, discharged in stable condition with husband via wheelchair.

## 2012-03-22 NOTE — Final Progress Note (DC Note for stay less than 48 (Signed)
POD #1    S) Pain well controlled with PO medications. OOB x 1 overnight, tolerating crackers. Voiding spontaneously x 1. No flatus, no BM. Denies chest pain, SOB, nausea, vomiting. Using IS at bedside.    Temp:  [97 F (36.1 C)-98.3 F (36.8 C)] 98.3 F (36.8 C)  Heart Rate:  [48-67] 56   Resp Rate:  [14-18] 16   BP: (109-158)/(58-87) 137/75 mmHg    UOP: 567mL/12h    Gen: NAD  CV: RRR, no MRG  Pulm: CTAB  Abd: soft, nondistended. BS wnl. Appropriately tender to palpation. Incisions C/D/I with steris  Ext: No edema, erythema, or tenderness    Hct:   Lab Results   Component Value Date/Time    HCT 31.9* 03/21/2012  7:53 PM    HCT 35.1* 01/02/2011  7:00 AM     Results     Procedure Component Value Units Date/Time    Basic metabolic panel (STAT) [[id number]]  (Abnormal) Collected:03/22/12 0606    Specimen Information:Blood Updated:03/22/12 0650     Glucose 149 (H) mg/dL      BUN 40.9 mg/dL      Creatinine 0.8 mg/dL      Calcium 8.0 (L) mg/dL      Sodium 811 mEq/L      Potassium 4.0 mEq/L      Chloride 107 mEq/L      CO2 22 mEq/L     GFR [[id number]] Collected:03/22/12 0606     EGFR >60.0   Updated:03/22/12 0650    CBC and differential [[id number]]  (Abnormal) Collected:03/22/12 0606    Specimen Information:Blood Updated:03/22/12 0644     WBC 7.21 x10 3/uL      RBC 3.34 (L) x10 6/uL      Hgb 8.2 (L) g/dL      Hematocrit 46.9 (L) %      MCV 80.8 fL      MCH 24.6 (L) pg      MCHC 30.4 (L) g/dL      RDW 22 (H) %      Platelets 294 x10 3/uL      MPV 9.6 fL      Neutrophils 85 (H) %      Lymphocytes Automated 10 (L) %      Monocytes 5 %      Eosinophils Automated 0 %      Basophils Automated 0 %      Immature Granulocyte 0 %      Nucleated RBC 0 /100 WBC      Neutrophils Absolute 6.10 x10 3/uL      Abs Lymph Automated 0.73 x10 3/uL      Abs Mono Automated 0.37 x10 3/uL      Abs Eos Automated 0.00 x10 3/uL      Absolute Baso Automated 0.00 x10 3/uL      Absolute Immature Granulocyte 0.01 x10 3/uL     Cell MorpHology [[id number]]   (Abnormal) Collected:03/21/12 1953     Cell Morphology: Abnormal (A) Updated:03/21/12 2102     Macrocytic 1+ (A)      Microcytic 1+ (A)      Hypochromia 1+ (A)      Polychromasia 1+ (A)      Target Cells 1+ (A)      Platelet Estimate Normal     CBC and differential (STAT) [[id number]]  (Abnormal) Collected:03/21/12 1953    Specimen Information:Blood Updated:03/21/12 2102     WBC 9.64 x10 3/uL      RBC 3.82 (L) x10 6/uL  Hgb 9.5 (L) g/dL      Hematocrit 96.0 (L) %      MCV 83.5 fL      MCH 24.9 (L) pg      MCHC 29.8 (L) g/dL      RDW 22 (H) %      Platelets 305 x10 3/uL      MPV 9.5 fL      Neutrophils 89 (H) %      Lymphocytes Automated 8 (L) %      Monocytes 2 %      Eosinophils Automated 0 %      Basophils Automated 0 %      Immature Granulocyte 0 %      Nucleated RBC 0 /100 WBC      Neutrophils Absolute 8.61 (H) x10 3/uL      Abs Lymph Automated 0.79 x10 3/uL      Abs Mono Automated 0.22 x10 3/uL      Abs Eos Automated 0.00 x10 3/uL      Absolute Baso Automated 0.00 x10 3/uL      Absolute Immature Granulocyte 0.02 x10 3/uL             A/P: 51 yo F POD#1 s/p robotic assisted TLH/ROS/ partial L salpingectomy  - Pain adequately controlled  - Meeting post-op milestones  - Ulcerative colitis: continue prednisone, mesalmine. Received hydrocortisone stress dose steroids x 3 doses.   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 Eben Burow in 10 days for post-op check    Michiel Sites PGY2

## 2012-03-23 LAB — LAB USE ONLY - HISTORICAL SURGICAL PATHOLOGY

## 2012-03-23 LAB — LAB USE ONLY - HISTORICAL NON-GYN MEDICAL CYTOLOGY

## 2012-04-06 MED ORDER — ACETAMINOPHEN 325 MG PO TABS
650.00 mg | ORAL_TABLET | Freq: Once | ORAL | Status: AC
Start: 2012-04-07 — End: 2012-04-07
  Administered 2012-04-07: 650 mg via ORAL
  Filled 2012-04-06: qty 2

## 2012-04-06 MED ORDER — DIPHENHYDRAMINE HCL 50 MG/ML IJ SOLN
25.00 mg | Freq: Once | INTRAMUSCULAR | Status: AC
Start: 2012-04-07 — End: 2012-04-07
  Administered 2012-04-07: 25 mg via INTRAVENOUS
  Filled 2012-04-06: qty 1

## 2012-04-06 MED ORDER — DIPHENHYDRAMINE HCL 25 MG PO CAPS
25.00 mg | ORAL_CAPSULE | Freq: Once | ORAL | Status: AC
Start: 2012-04-07 — End: 2012-04-07

## 2012-04-06 MED ORDER — DIPHENHYDRAMINE HCL 50 MG/ML IJ SOLN
50.00 mg | Freq: Once | INTRAMUSCULAR | Status: DC | PRN
Start: 2012-04-07 — End: 2012-04-08

## 2012-04-06 MED ORDER — METHYLPREDNISOLONE SODIUM SUCC 40 MG IJ SOLR
25.00 mg | Freq: Once | INTRAMUSCULAR | Status: DC | PRN
Start: 2012-04-07 — End: 2012-04-08

## 2012-04-06 MED ORDER — SODIUM CHLORIDE 0.9 % IV SOLN
300.00 mg | Freq: Once | INTRAVENOUS | Status: AC
Start: 2012-04-07 — End: 2012-04-07
  Administered 2012-04-07: 300 mg via INTRAVENOUS
  Filled 2012-04-06: qty 300

## 2012-04-07 ENCOUNTER — Ambulatory Visit
Admission: RE | Admit: 2012-04-07 | Discharge: 2012-04-07 | Disposition: A | Discharge status: Home / Self Care | Payer: BC Managed Care – PPO | Source: Ambulatory Visit | Attending: Gastroenterology | Admitting: Gastroenterology

## 2012-04-07 VITALS — BP 119/76 | HR 62 | Temp 96.4°F | Resp 16 | Ht 63.0 in | Wt 138.0 lb

## 2012-04-07 DIAGNOSIS — K5289 Other specified noninfective gastroenteritis and colitis: Secondary | ICD-10-CM | POA: Insufficient documentation

## 2012-04-07 DIAGNOSIS — K5190 Ulcerative colitis, unspecified, without complications: Secondary | ICD-10-CM

## 2012-04-07 NOTE — Progress Notes (Signed)
0920  Arrived ambulatory to [facility] for a remicade infusion.  No complaints.  Premeds given.  1610 Remicade was started at 10 mL /hour x15 minutes,  then 20 mL/hr x15 minutes,  then 40 mL/hr for 15 minutes,  then 80 mL/hr for 15 min,  then 150 mL/hr for 30 min and  then 250 mL/hr till infusion completed.  1200 Remicade infused without problem.  Patient states she will call MD for problems.  1210 Patient left in NAD, ambulatory.

## 2012-04-07 NOTE — Discharge Instructions (Signed)
Infliximab Solution for injection  What is this medicine?  INFLIXIMAB (in FLIX i mab) is used to treat Crohn's disease and ulcerative colitis. It is also used to treat ankylosing spondylitis, psoriasis, and some forms of arthritis.  This medicine may be used for other purposes; ask your health care provider or pharmacist if you have questions.  What should I tell my health care provider before I take this medicine?  They need to know if you have any of these conditions:   diabetes   exposure to tuberculosis   heart failure   hepatitis or liver disease   immune system problems   infection   lung or breathing disease, like COPD   multiple sclerosis   current or past resident of [place]   seizure disorder   an unusual or allergic reaction to infliximab, mouse proteins, other medicines, foods, dyes, or preservatives   pregnant or trying to get pregnant   breast-feeding  How should I use this medicine?  This medicine is for injection into a vein. It is usually given by a health care professional in a hospital or clinic setting.  A special MedGuide will be given to you by the pharmacist with each prescription and refill. Be sure to read this information carefully each time.  Talk to your pediatrician regarding the use of this medicine in children. Special care may be needed.  Overdosage: If you think you have taken too much of this medicine contact a poison control center or emergency room at once.  NOTE: This medicine is only for you. Do not share this medicine with others.  What if I miss a dose?  It is important not to miss your dose. Call your doctor or health care professional if you are unable to keep an appointment.  What may interact with this medicine?  Do not take this medicine with any of the following medications:   anakinra   rilonacept  This medicine may also interact with the following medications:   vaccines  This list may not describe all possible interactions.  Give your health care provider a list of all the medicines, herbs, non-prescription drugs, or dietary supplements you use. Also tell them if you smoke, drink alcohol, or use illegal drugs. Some items may interact with your medicine.  What should I watch for while using this medicine?  Visit your doctor or health care professional for regular checks on your progress.  If you get a cold or other infection while receiving this medicine, call your doctor or health care professional. Do not treat yourself. This medicine may decrease your body's ability to fight infections. Before beginning therapy, your doctor may do a test to see if you have been exposed to tuberculosis.  This medicine may make the symptoms of heart failure worse in some patients. If you notice symptoms such as increased shortness of breath or swelling of the ankles or legs, contact your health care provider right away.  If you are going to have surgery or dental work, tell your health care professional or dentist that you have received this medicine.  If you take this medicine for plaque psoriasis, stay out of the sun. If you cannot avoid being in the sun, wear protective clothing and use sunscreen. Do not use sun lamps or tanning beds/booths.  What side effects may I notice from receiving this medicine?  Side effects that you should report to your doctor or health care professional as soon as possible:     allergic reactions like skin rash, itching or hives, swelling of the face, lips, or tongue   chest pain   fever or chills, usually related to the infusion   muscle or joint pain   red, scaly patches or raised bumps on the skin   signs of infection - fever or chills, cough, sore throat, pain or difficulty passing urine   swollen lymph nodes in the neck, underarm, or groin areas   unexplained weight loss   unusual bleeding or bruising   unusually weak or tired   yellowing of the eyes or skin  Side effects that usually do not require medical  attention (report to your doctor or health care professional if they continue or are bothersome):   headache   heartburn or stomach pain   nausea, vomiting  This list may not describe all possible side effects. Call your doctor for medical advice about side effects. You may report side effects to FDA at 1-800-FDA-1088.  Where should I keep my medicine?  This drug is given in a hospital or clinic and will not be stored at home.  NOTE:This sheet is a summary. It may not cover all possible information. If you have questions about this medicine, talk to your doctor, pharmacist, or health care provider. Copyright 2013 [employer] Standard

## 2012-05-14 ENCOUNTER — Emergency Department (HOSPITAL_COMMUNITY): Payer: BC Managed Care – PPO

## 2012-05-14 ENCOUNTER — Encounter (HOSPITAL_COMMUNITY): Payer: Self-pay | Admitting: Emergency Medicine

## 2012-05-14 ENCOUNTER — Inpatient Hospital Stay (HOSPITAL_COMMUNITY)
Admission: EM | Admit: 2012-05-14 | Discharge: 2012-05-17 | DRG: 210 | Disposition: A | Discharge status: Home Health | Payer: BC Managed Care – PPO | Attending: Internal Medicine | Admitting: Internal Medicine

## 2012-05-14 DIAGNOSIS — T50995A Adverse effect of other drugs, medicaments and biological substances, initial encounter: Secondary | ICD-10-CM | POA: Diagnosis present

## 2012-05-14 DIAGNOSIS — K5909 Other constipation: Secondary | ICD-10-CM | POA: Diagnosis present

## 2012-05-14 DIAGNOSIS — W010XXA Fall on same level from slipping, tripping and stumbling without subsequent striking against object, initial encounter: Secondary | ICD-10-CM | POA: Diagnosis present

## 2012-05-14 DIAGNOSIS — D62 Acute posthemorrhagic anemia: Secondary | ICD-10-CM

## 2012-05-14 DIAGNOSIS — S72141A Displaced intertrochanteric fracture of right femur, initial encounter for closed fracture: Secondary | ICD-10-CM

## 2012-05-14 DIAGNOSIS — Y92009 Unspecified place in unspecified non-institutional (private) residence as the place of occurrence of the external cause: Secondary | ICD-10-CM

## 2012-05-14 DIAGNOSIS — K5900 Constipation, unspecified: Secondary | ICD-10-CM

## 2012-05-14 DIAGNOSIS — S72143A Displaced intertrochanteric fracture of unspecified femur, initial encounter for closed fracture: Principal | ICD-10-CM | POA: Diagnosis present

## 2012-05-14 DIAGNOSIS — E876 Hypokalemia: Secondary | ICD-10-CM | POA: Diagnosis present

## 2012-05-14 LAB — URINALYSIS, ROUTINE W REFLEX MICROSCOPIC
Bilirubin Urine: NEGATIVE
Nitrite: NEGATIVE
Specific Gravity, Urine: 1.016 (ref 1.005–1.030)
Urobilinogen, UA: 0.2 mg/dL (ref 0.0–1.0)

## 2012-05-14 LAB — CBC WITH DIFFERENTIAL/PLATELET
Basophils Absolute: 0 10*3/uL (ref 0.0–0.1)
Basophils Relative: 0 % (ref 0–1)
Eosinophils Relative: 0 % (ref 0–5)
HCT: 36.5 % (ref 36.0–46.0)
Hemoglobin: 12.7 g/dL (ref 12.0–15.0)
Lymphocytes Relative: 10 % — ABNORMAL LOW (ref 12–46)
Lymphs Abs: 1 10*3/uL (ref 0.7–4.0)
MCH: 31.8 pg (ref 26.0–34.0)
Monocytes Absolute: 0.6 10*3/uL (ref 0.1–1.0)
Monocytes Relative: 6 % (ref 3–12)
Neutro Abs: 8.3 10*3/uL — ABNORMAL HIGH (ref 1.7–7.7)
Neutrophils Relative %: 83 % — ABNORMAL HIGH (ref 43–77)

## 2012-05-14 LAB — BASIC METABOLIC PANEL
BUN: 21 mg/dL (ref 6–23)
CO2: 24 mEq/L (ref 19–32)
Calcium: 9.7 mg/dL (ref 8.4–10.5)
Chloride: 102 mEq/L (ref 96–112)
Creatinine, Ser: 0.69 mg/dL (ref 0.50–1.10)
GFR calc Af Amer: >90 mL/min (ref >90)
Glucose, Bld: 129 mg/dL — ABNORMAL HIGH (ref 70–99)
Sodium: 138 mEq/L (ref 135–145)

## 2012-05-14 LAB — URINE MICROSCOPIC-ADD ON

## 2012-05-14 MED ORDER — HYDROMORPHONE HCL PF 1 MG/ML IJ SOLN
1.0000 mg | Freq: Once | INTRAMUSCULAR | Status: AC
  Administered 2012-05-14: 1 mg via INTRAVENOUS
  Filled 2012-05-14: qty 1

## 2012-05-14 MED ORDER — ONDANSETRON HCL 4 MG/2ML IJ SOLN
4.0000 mg | Freq: Once | INTRAMUSCULAR | Status: AC
  Administered 2012-05-15: 4 mg via INTRAVENOUS
  Filled 2012-05-14: qty 2

## 2012-05-14 MED ORDER — SODIUM CHLORIDE 0.9 % IV SOLN
INTRAVENOUS | Status: DC
  Administered 2012-05-14 – 2012-05-15 (×2): via INTRAVENOUS

## 2012-05-14 MED ORDER — SODIUM CHLORIDE 0.9 % IV SOLN: INTRAVENOUS | Status: AC

## 2012-05-14 MED ORDER — FENTANYL CITRATE 0.05 MG/ML IJ SOLN
50.0000 ug | Freq: Once | INTRAMUSCULAR | Status: AC
  Administered 2012-05-14: 50 ug via INTRAVENOUS
  Filled 2012-05-14: qty 2

## 2012-05-14 NOTE — ED Provider Notes (Addendum)
History     CSN: [id number]  Arrival date & time 05/14/12  2105   First MD Initiated Contact with Patient 05/14/12 2159      Chief Complaint  Patient presents with  . Fall    (Consider location/radiation/quality/duration/timing/severity/associated sxs/prior treatment) Patient is a 51 y.o. female presenting with fall. The history is provided by the patient and the spouse.  Fall Pertinent negatives include no abdominal pain and no headaches.   51 year old, female, with no significant past medical history presents emergency department complaining of right hip pain.  She is letting her.dog out.  She tripped over, and fell to the ground and landed her right hip.  She denies any other injuries or any other pain.  She takes no medicines and has no allergies to medicines.  History reviewed. No pertinent past medical history.  History reviewed. No pertinent past surgical history.  No family history on file.  History  Substance Use Topics  . Smoking status: Never Smoker   . Smokeless tobacco: Not on file  . Alcohol Use: No    OB History    Grav Para Term Preterm Abortions TAB SAB Ect Mult Living                  Review of Systems  HENT: Negative for neck pain.   Eyes: Negative for visual disturbance.  Cardiovascular: Negative for chest pain.  Gastrointestinal: Negative for abdominal pain.  Musculoskeletal: Negative for back pain.       Right hip pain  Skin: Negative for wound.  Neurological: Negative for headaches.  Hematological: Does not bruise/bleed easily.  Psychiatric/Behavioral: Negative for confusion.  All other systems reviewed and are negative.    Allergies  Review of patient's allergies indicates no known allergies.  Home Medications   Current Outpatient Rx  Name  Route  Sig  Dispense  Refill  . IBUPROFEN 200 MG PO TABS   Oral   Take 200 mg by mouth every 6 (six) hours as needed.           BP 149/72  Pulse 74  Temp 98.4 F (36.9 C) (Oral)   Resp 16  SpO2 100%  Physical Exam  Nursing note and vitals reviewed. Constitutional: She is oriented to person, place, and time. She appears well-developed and well-nourished.  HENT:  Head: Normocephalic and atraumatic.  Eyes: Conjunctivae normal and EOM are normal.  Neck: Normal range of motion. Neck supple.  Cardiovascular: Normal rate, regular rhythm and intact distal pulses.   No murmur heard. Pulmonary/Chest: Effort normal and breath sounds normal. No respiratory distress.  Abdominal: Soft. Bowel sounds are normal. There is no tenderness.  Musculoskeletal: She exhibits tenderness. She exhibits no edema.       Right lower extremity is externally rotated and shorter than the left extremity  Neurological: She is alert and oriented to person, place, and time. No cranial nerve deficit.  Skin: Skin is warm and dry.  Psychiatric: She has a normal mood and affect. Thought content normal.    ED Course  Procedures (including critical care time)   Labs Reviewed  BASIC METABOLIC PANEL  CBC WITH DIFFERENTIAL  URINALYSIS, ROUTINE W REFLEX MICROSCOPIC   Dg Femur Right  05/14/2012  *RADIOLOGY REPORT*  Clinical Data: Right femoral pain after fall.  RIGHT FEMUR - 2 VIEW  Comparison: None.  Findings: There is an impacted intertrochanteric fracture of the right hip with valgus angulation of the fracture fragments. The distal femur appears intact.  No focal bone  lesion or bone destruction.  IMPRESSION: Intertrochanteric fracture of the right proximal femur with valgus orientation.   Original Report Authenticated By: Burman Nieves, M.D.    Dg Chest Port 1 View  05/14/2012  *RADIOLOGY REPORT*  Clinical Data: Preop.  Nonsmoker.  PORTABLE CHEST - 1 VIEW  Comparison: None.  Findings: The heart size and pulmonary vascularity are normal. The lungs appear clear and expanded without focal air space disease or consolidation. No blunting of the costophrenic angles.  No pneumothorax.  Mediastinal contours  appear intact.  IMPRESSION: No evidence of active pulmonary disease.   Original Report Authenticated By: Burman Nieves, M.D.      No diagnosis found.  ECG. Normal sinus rhythm at 73 beats per minute. Normal axis. First degree AV block. Nonspecific T-wave changes  10:45 PM Spoke with Dr. Ave Filter.  He will consult for surgery.  He wants pt npo after midnight.  MDM  Right intertrochanteric fracture        Cheri Guppy, MD 05/14/12 2240  Cheri Guppy, MD 05/14/12 1610  Cheri Guppy, MD 05/14/12 2245

## 2012-05-14 NOTE — ED Notes (Signed)
Pt Arrived to ED via EMS with a c/o of thigh pain.  Pt tripped over her dog and fell on her hardwood floor.  Pt has strong pedal pulses.

## 2012-05-15 ENCOUNTER — Inpatient Hospital Stay (HOSPITAL_COMMUNITY): Payer: BC Managed Care – PPO

## 2012-05-15 ENCOUNTER — Encounter (HOSPITAL_COMMUNITY): Payer: Self-pay | Admitting: Anesthesiology

## 2012-05-15 ENCOUNTER — Other Ambulatory Visit: Payer: Self-pay

## 2012-05-15 ENCOUNTER — Encounter (HOSPITAL_COMMUNITY): Payer: Self-pay | Admitting: *Deleted

## 2012-05-15 ENCOUNTER — Inpatient Hospital Stay (HOSPITAL_COMMUNITY): Payer: BC Managed Care – PPO | Admitting: Anesthesiology

## 2012-05-15 ENCOUNTER — Encounter (HOSPITAL_COMMUNITY)
Admission: EM | Disposition: A | Discharge status: Home Health | Payer: Self-pay | Source: Home / Self Care | Attending: Internal Medicine

## 2012-05-15 DIAGNOSIS — E876 Hypokalemia: Secondary | ICD-10-CM | POA: Diagnosis present

## 2012-05-15 DIAGNOSIS — S72141A Displaced intertrochanteric fracture of right femur, initial encounter for closed fracture: Secondary | ICD-10-CM | POA: Diagnosis present

## 2012-05-15 HISTORY — PX: FEMUR IM NAIL: SHX1597

## 2012-05-15 SURGERY — INSERTION, INTRAMEDULLARY ROD, FEMUR
Anesthesia: General | Site: Hip | Laterality: Right | Wound class: Clean

## 2012-05-15 MED ORDER — LIDOCAINE HCL (CARDIAC) 20 MG/ML IV SOLN
INTRAVENOUS | Status: DC | PRN
  Administered 2012-05-15: 50 mg via INTRAVENOUS

## 2012-05-15 MED ORDER — HYDROCODONE-ACETAMINOPHEN 5-325 MG PO TABS
1.0000 | ORAL_TABLET | Freq: Four times a day (QID) | ORAL | Status: DC | PRN
  Administered 2012-05-16: 2 via ORAL
  Administered 2012-05-16: 1 via ORAL
  Filled 2012-05-15: qty 2
  Filled 2012-05-15: qty 1
  Filled 2012-05-15: qty 2

## 2012-05-15 MED ORDER — METOCLOPRAMIDE HCL 5 MG/ML IJ SOLN
5.0000 mg | Freq: Three times a day (TID) | INTRAMUSCULAR | Status: DC | PRN
  Administered 2012-05-15: 10 mg via INTRAVENOUS
  Filled 2012-05-15: qty 2

## 2012-05-15 MED ORDER — ONDANSETRON HCL 4 MG/2ML IJ SOLN
INTRAMUSCULAR | Status: DC | PRN
  Administered 2012-05-15: 4 mg via INTRAVENOUS

## 2012-05-15 MED ORDER — METOCLOPRAMIDE HCL 10 MG PO TABS: 5.0000 mg | ORAL_TABLET | Freq: Three times a day (TID) | ORAL | Status: DC | PRN

## 2012-05-15 MED ORDER — GLYCOPYRROLATE 0.2 MG/ML IJ SOLN
INTRAMUSCULAR | Status: DC | PRN
  Administered 2012-05-15: 0.6 mg via INTRAVENOUS

## 2012-05-15 MED ORDER — ACETAMINOPHEN 10 MG/ML IV SOLN
INTRAVENOUS | Status: AC
  Filled 2012-05-15: qty 100

## 2012-05-15 MED ORDER — ACETAMINOPHEN 10 MG/ML IV SOLN
INTRAVENOUS | Status: DC | PRN
  Administered 2012-05-15: 1000 mg via INTRAVENOUS

## 2012-05-15 MED ORDER — CEFAZOLIN SODIUM-DEXTROSE 2-3 GM-% IV SOLR
INTRAVENOUS | Status: AC
  Filled 2012-05-15: qty 50

## 2012-05-15 MED ORDER — ONDANSETRON HCL 4 MG/2ML IJ SOLN: 4.0000 mg | Freq: Four times a day (QID) | INTRAMUSCULAR | Status: DC | PRN

## 2012-05-15 MED ORDER — ROCURONIUM BROMIDE 100 MG/10ML IV SOLN
INTRAVENOUS | Status: DC | PRN
  Administered 2012-05-15: 25 mg via INTRAVENOUS

## 2012-05-15 MED ORDER — CALCIUM CARBONATE-VITAMIN D 500-200 MG-UNIT PO TABS
1.0000 | ORAL_TABLET | Freq: Every day | ORAL | Status: DC
  Administered 2012-05-16: 1 via ORAL
  Filled 2012-05-15 (×2): qty 1

## 2012-05-15 MED ORDER — MORPHINE SULFATE 2 MG/ML IJ SOLN
2.0000 mg | INTRAMUSCULAR | Status: DC | PRN
  Filled 2012-05-15: qty 1

## 2012-05-15 MED ORDER — POLYETHYLENE GLYCOL 3350 17 G PO PACK: 17.0000 g | PACK | Freq: Every day | ORAL | Status: DC | PRN

## 2012-05-15 MED ORDER — FENTANYL CITRATE 0.05 MG/ML IJ SOLN
INTRAMUSCULAR | Status: DC | PRN
  Administered 2012-05-15: 50 ug via INTRAVENOUS
  Administered 2012-05-15: 100 ug via INTRAVENOUS
  Administered 2012-05-15 (×2): 50 ug via INTRAVENOUS

## 2012-05-15 MED ORDER — DEXAMETHASONE SODIUM PHOSPHATE 10 MG/ML IJ SOLN
INTRAMUSCULAR | Status: DC | PRN
  Administered 2012-05-15: 10 mg via INTRAVENOUS

## 2012-05-15 MED ORDER — ZOLPIDEM TARTRATE 5 MG PO TABS: 5.0000 mg | ORAL_TABLET | Freq: Every evening | ORAL | Status: DC | PRN

## 2012-05-15 MED ORDER — PROPOFOL 10 MG/ML IV BOLUS
INTRAVENOUS | Status: DC | PRN
  Administered 2012-05-15: 150 mg via INTRAVENOUS

## 2012-05-15 MED ORDER — MENTHOL 3 MG MT LOZG
1.0000 | LOZENGE | OROMUCOSAL | Status: DC | PRN
  Filled 2012-05-15: qty 9

## 2012-05-15 MED ORDER — SODIUM CHLORIDE 0.9 % IV SOLN
INTRAVENOUS | Status: DC
  Administered 2012-05-15: 06:00:00 via INTRAVENOUS
  Administered 2012-05-16: 20 mL/h via INTRAVENOUS

## 2012-05-15 MED ORDER — LACTATED RINGERS IV SOLN: INTRAVENOUS | Status: DC

## 2012-05-15 MED ORDER — FLEET ENEMA 7-19 GM/118ML RE ENEM: 1.0000 | ENEMA | Freq: Once | RECTAL | Status: AC | PRN

## 2012-05-15 MED ORDER — SUCCINYLCHOLINE CHLORIDE 20 MG/ML IJ SOLN
INTRAMUSCULAR | Status: DC | PRN
  Administered 2012-05-15: 100 mg via INTRAVENOUS

## 2012-05-15 MED ORDER — GLUCOSAMINE 500 MG PO CAPS
2.0000 | ORAL_CAPSULE | Freq: Every day | ORAL | Status: DC
Start: 2012-05-15 — End: 2012-05-15

## 2012-05-15 MED ORDER — KCL IN DEXTROSE-NACL 20-5-0.45 MEQ/L-%-% IV SOLN
INTRAVENOUS | Status: DC
  Administered 2012-05-15: 1000 mL via INTRAVENOUS
  Administered 2012-05-16: 04:00:00 via INTRAVENOUS
  Filled 2012-05-15 (×3): qty 1000

## 2012-05-15 MED ORDER — 0.9 % SODIUM CHLORIDE (POUR BTL) OPTIME
TOPICAL | Status: DC | PRN
  Administered 2012-05-15: 1000 mL

## 2012-05-15 MED ORDER — ALUM & MAG HYDROXIDE-SIMETH 200-200-20 MG/5ML PO SUSP: 30.0000 mL | ORAL | Status: DC | PRN

## 2012-05-15 MED ORDER — ACETAMINOPHEN 650 MG RE SUPP: 650.0000 mg | Freq: Four times a day (QID) | RECTAL | Status: DC | PRN

## 2012-05-15 MED ORDER — LACTATED RINGERS IV SOLN
INTRAVENOUS | Status: DC | PRN
  Administered 2012-05-15: 11:00:00 via INTRAVENOUS

## 2012-05-15 MED ORDER — PHENOL 1.4 % MT LIQD: 1.0000 | OROMUCOSAL | Status: DC | PRN

## 2012-05-15 MED ORDER — ONDANSETRON HCL 4 MG PO TABS: 4.0000 mg | ORAL_TABLET | Freq: Four times a day (QID) | ORAL | Status: DC | PRN

## 2012-05-15 MED ORDER — NEOSTIGMINE METHYLSULFATE 1 MG/ML IJ SOLN
INTRAMUSCULAR | Status: DC | PRN
  Administered 2012-05-15: 4 mg via INTRAVENOUS

## 2012-05-15 MED ORDER — HYDROMORPHONE HCL PF 1 MG/ML IJ SOLN
0.2500 mg | INTRAMUSCULAR | Status: DC | PRN
  Administered 2012-05-15 (×2): 0.5 mg via INTRAVENOUS

## 2012-05-15 MED ORDER — OXYCODONE HCL 5 MG PO TABS
5.0000 mg | ORAL_TABLET | ORAL | Status: DC | PRN
  Administered 2012-05-16: 5 mg via ORAL
  Administered 2012-05-17: 10 mg via ORAL
  Administered 2012-05-17: 5 mg via ORAL
  Filled 2012-05-15: qty 2
  Filled 2012-05-15 (×2): qty 1

## 2012-05-15 MED ORDER — CEFAZOLIN SODIUM-DEXTROSE 2-3 GM-% IV SOLR
2.0000 g | INTRAVENOUS | Status: AC
Start: 2012-05-15 — End: 2012-05-15
  Administered 2012-05-15: 2 g via INTRAVENOUS

## 2012-05-15 MED ORDER — ASPIRIN EC 325 MG PO TBEC
325.0000 mg | DELAYED_RELEASE_TABLET | Freq: Two times a day (BID) | ORAL | Status: DC
  Administered 2012-05-15 – 2012-05-17 (×4): 325 mg via ORAL
  Filled 2012-05-15 (×7): qty 1

## 2012-05-15 MED ORDER — DOCUSATE SODIUM 100 MG PO CAPS
100.0000 mg | ORAL_CAPSULE | Freq: Two times a day (BID) | ORAL | Status: DC
  Administered 2012-05-15 – 2012-05-17 (×4): 100 mg via ORAL

## 2012-05-15 MED ORDER — MORPHINE SULFATE 2 MG/ML IJ SOLN: 0.5000 mg | INTRAMUSCULAR | Status: DC | PRN

## 2012-05-15 MED ORDER — BISACODYL 5 MG PO TBEC
5.0000 mg | DELAYED_RELEASE_TABLET | Freq: Every day | ORAL | Status: DC | PRN
  Filled 2012-05-15: qty 1

## 2012-05-15 MED ORDER — ACETAMINOPHEN 325 MG PO TABS
650.0000 mg | ORAL_TABLET | Freq: Four times a day (QID) | ORAL | Status: DC | PRN
  Administered 2012-05-17: 650 mg via ORAL
  Filled 2012-05-15: qty 2

## 2012-05-15 MED ORDER — MIDAZOLAM HCL 5 MG/5ML IJ SOLN
INTRAMUSCULAR | Status: DC | PRN
  Administered 2012-05-15: 2 mg via INTRAVENOUS
  Administered 2012-05-15: 1 mg via INTRAVENOUS

## 2012-05-15 MED ORDER — CEFAZOLIN SODIUM-DEXTROSE 2-3 GM-% IV SOLR
2.0000 g | Freq: Four times a day (QID) | INTRAVENOUS | Status: AC
  Administered 2012-05-15 – 2012-05-16 (×2): 2 g via INTRAVENOUS
  Filled 2012-05-15 (×2): qty 50

## 2012-05-15 MED ORDER — POTASSIUM CHLORIDE CRYS ER 20 MEQ PO TBCR
40.0000 meq | EXTENDED_RELEASE_TABLET | Freq: Once | ORAL | Status: AC
  Administered 2012-05-15: 40 meq via ORAL
  Filled 2012-05-15: qty 2

## 2012-05-15 SURGICAL SUPPLY — 37 items
BAG SPEC THK2 15X12 ZIP CLS (MISCELLANEOUS) ×1
BAG ZIPLOCK 12X15 (MISCELLANEOUS) ×2 IMPLANT
BANDAGE GAUZE ELAST BULKY 4 IN (GAUZE/BANDAGES/DRESSINGS) ×2 IMPLANT
BIT DRILL 4.3MMS DISTAL GRDTED (BIT) ×1 IMPLANT
CANISTER SUCTION 2500CC (MISCELLANEOUS) IMPLANT
CLOTH BEACON ORANGE TIMEOUT ST (SAFETY) ×2 IMPLANT
DRAPE STERI IOBAN 125X83 (DRAPES) ×2 IMPLANT
DRILL 4.3MMS DISTAL GRADUATED (BIT) ×2
DRSG EMULSION OIL 3X16 NADH (GAUZE/BANDAGES/DRESSINGS) ×2 IMPLANT
DRSG MEPILEX BORDER 4X8 (GAUZE/BANDAGES/DRESSINGS) ×1 IMPLANT
DRSG PAD ABDOMINAL 8X10 ST (GAUZE/BANDAGES/DRESSINGS) ×2 IMPLANT
DURAPREP 26ML APPLICATOR (WOUND CARE) ×2 IMPLANT
ELECT REM PT RETURN 9FT ADLT (ELECTROSURGICAL) ×2
ELECTRODE REM PT RTRN 9FT ADLT (ELECTROSURGICAL) ×1 IMPLANT
GLOVE BIO SURGEON STRL SZ8.5 (GLOVE) ×2 IMPLANT
GLOVE BIOGEL PI IND STRL 8 (GLOVE) ×1 IMPLANT
GLOVE BIOGEL PI INDICATOR 8 (GLOVE) ×1
GUIDEPIN 3.2X17.5 THRD DISP (PIN) ×1 IMPLANT
GUIDEWIRE BALL NOSE 80CM (WIRE) ×1 IMPLANT
HFN RH 130 DEG 11MM X 360MM (Orthopedic Implant) ×1 IMPLANT
NS IRRIG 1000ML POUR BTL (IV SOLUTION) ×2 IMPLANT
PACK GENERAL/GYN (CUSTOM PROCEDURE TRAY) ×2 IMPLANT
PAD CAST 4YDX4 CTTN HI CHSV (CAST SUPPLIES) ×1 IMPLANT
PADDING CAST COTTON 4X4 STRL (CAST SUPPLIES) ×2
POSITIONER SURGICAL ARM (MISCELLANEOUS) ×2 IMPLANT
SCREW BONE CORTICAL 5.0X3 (Screw) ×1 IMPLANT
SCREW LAG HIP NAIL 10.5X95 (Screw) ×2 IMPLANT
SCREWDRIVER HEX TIP 3.5MM (MISCELLANEOUS) ×2 IMPLANT
SPONGE GAUZE 4X4 12PLY (GAUZE/BANDAGES/DRESSINGS) ×2 IMPLANT
SPONGE LAP 18X18 X RAY DECT (DISPOSABLE) ×2 IMPLANT
STRIP CLOSURE SKIN 1/2X4 (GAUZE/BANDAGES/DRESSINGS) ×2 IMPLANT
SUT MNCRL AB 3-0 PS2 18 (SUTURE) ×2 IMPLANT
SUT VIC AB 1 CT1 36 (SUTURE) ×4 IMPLANT
SUT VIC AB 2-0 CT1 27 (SUTURE) ×4
SUT VIC AB 2-0 CT1 TAPERPNT 27 (SUTURE) ×2 IMPLANT
TRAY FOLEY CATH 14FRSI W/METER (CATHETERS) ×1 IMPLANT
WATER STERILE IRR 1500ML POUR (IV SOLUTION) ×2 IMPLANT

## 2012-05-15 NOTE — Anesthesia Preprocedure Evaluation (Signed)

## 2012-05-15 NOTE — Consult Note (Signed)
Reason for Consult:R hip fracture Referring Physician: Dr. Santa Genera is an 51 y.o. female.  HPI: The patient is a very pleasant 51 year old female who was walking her dog last night and slipped and had a hard fall directly onto her right hip. She had immediate pain and inability to weight-bear. She was seen in emergency department where x-rays revealed an intertrochanteric proximal femur fracture. I was consult to for evaluation and management. Pain is improved with pain medication. No other associated injuries. No associated numbness or tingling.  History reviewed. No pertinent past medical history.  History reviewed. No pertinent past surgical history.  No family history on file.  Social History:  reports that she has never smoked. She does not have any smokeless tobacco history on file. She reports that she does not drink alcohol or use illicit drugs.  Allergies: No Known Allergies  Medications: I have reviewed the patient's current medications.  Results for orders placed during the hospital encounter of 05/14/12 (from the past 48 hour(s))  URINALYSIS, ROUTINE W REFLEX MICROSCOPIC     Status: Abnormal   Collection Time   05/14/12 10:12 PM      Component Value Range Comment   Color, Urine YELLOW  YELLOW    APPearance TURBID (*) CLEAR    Specific Gravity, Urine 1.016  1.005 - 1.030    pH 7.5  5.0 - 8.0    Glucose, UA NEGATIVE  NEGATIVE mg/dL    Hgb urine dipstick NEGATIVE  NEGATIVE    Bilirubin Urine NEGATIVE  NEGATIVE    Ketones, ur TRACE (*) NEGATIVE mg/dL    Protein, ur NEGATIVE  NEGATIVE mg/dL    Urobilinogen, UA 0.2  0.0 - 1.0 mg/dL    Nitrite NEGATIVE  NEGATIVE    Leukocytes, UA MODERATE (*) NEGATIVE   URINE MICROSCOPIC-ADD ON     Status: Abnormal   Collection Time   05/14/12 10:12 PM      Component Value Range Comment   Squamous Epithelial / LPF FEW (*) RARE    WBC, UA 7-10  <3 WBC/hpf    Bacteria, UA MANY (*) RARE   BASIC METABOLIC PANEL     Status:  Abnormal   Collection Time   05/14/12 10:55 PM      Component Value Range Comment   Sodium 138  135 - 145 mEq/L    Potassium 3.2 (*) 3.5 - 5.1 mEq/L    Chloride 102  96 - 112 mEq/L    CO2 24  19 - 32 mEq/L    Glucose, Bld 129 (*) 70 - 99 mg/dL    BUN 21  6 - 23 mg/dL    Creatinine, Ser 4.78  0.50 - 1.10 mg/dL    Calcium 9.7  8.4 - 29.5 mg/dL    GFR calc non Af Amer >90  >90 mL/min    GFR calc Af Amer >90  >90 mL/min   CBC WITH DIFFERENTIAL     Status: Abnormal   Collection Time   05/14/12 10:55 PM      Component Value Range Comment   WBC 10.0  4.0 - 10.5 K/uL    RBC 3.99  3.87 - 5.11 MIL/uL    Hemoglobin 12.7  12.0 - 15.0 g/dL    HCT 62.1  30.8 - 65.7 %    MCV 91.5  78.0 - 100.0 fL    MCH 31.8  26.0 - 34.0 pg    MCHC 34.8  30.0 - 36.0 g/dL    RDW  12.5  11.5 - 15.5 %    Platelets 241  150 - 400 K/uL    Neutrophils Relative 83 (*) 43 - 77 %    Neutro Abs 8.3 (*) 1.7 - 7.7 K/uL    Lymphocytes Relative 10 (*) 12 - 46 %    Lymphs Abs 1.0  0.7 - 4.0 K/uL    Monocytes Relative 6  3 - 12 %    Monocytes Absolute 0.6  0.1 - 1.0 K/uL    Eosinophils Relative 0  0 - 5 %    Eosinophils Absolute 0.0  0.0 - 0.7 K/uL    Basophils Relative 0  0 - 1 %    Basophils Absolute 0.0  0.0 - 0.1 K/uL     Dg Femur Right  05/14/2012  *RADIOLOGY REPORT*  Clinical Data: Right femoral pain after fall.  RIGHT FEMUR - 2 VIEW  Comparison: None.  Findings: There is an impacted intertrochanteric fracture of the right hip with valgus angulation of the fracture fragments. The distal femur appears intact.  No focal bone lesion or bone destruction.  IMPRESSION: Intertrochanteric fracture of the right proximal femur with valgus orientation.   Original Report Authenticated By: Burman Nieves, M.D.    Dg Chest Port 1 View  05/14/2012  *RADIOLOGY REPORT*  Clinical Data: Preop.  Nonsmoker.  PORTABLE CHEST - 1 VIEW  Comparison: None.  Findings: The heart size and pulmonary vascularity are normal. The lungs appear  clear and expanded without focal air space disease or consolidation. No blunting of the costophrenic angles.  No pneumothorax.  Mediastinal contours appear intact.  IMPRESSION: No evidence of active pulmonary disease.   Original Report Authenticated By: Burman Nieves, M.D.     Review of Systems  All other systems reviewed and are negative.   Blood pressure 123/70, pulse 83, temperature 97.9 F (36.6 C), temperature source Oral, resp. rate 16, height 5\' 7"  (1.702 m), weight 58.968 kg (130 lb), SpO2 99.00%. Physical Exam  Constitutional: She is oriented to person, place, and time. She appears well-developed and well-nourished.  HENT:  Head: Atraumatic.  Eyes: EOM are normal.  Cardiovascular: Intact distal pulses.   Respiratory: Effort normal.  Musculoskeletal:       Right hip: She exhibits decreased range of motion and tenderness.       Distally NVID  Neurological: She is alert and oriented to person, place, and time.  Skin: Skin is warm and dry.  Psychiatric: She has a normal mood and affect.    Assessment/Plan: Right hip intertrochanteric proximal femur fracture Recommend intramedullary nail fixation to promote early ambulation, anatomic alignment, pain control. Risks benefits alternatives discussed at length. She would like to go ahead with surgery. We will give this to him this morning. We did talk about the fact that this is generally a fracture that occurs in more elderly patients and that once she recovers we should further workup her bone density. N.p.o. She'll have aspirin and SCDs postoperatively for DVT prophylaxis.  Mable Paris 05/15/2012, 8:14 AM

## 2012-05-15 NOTE — Anesthesia Postprocedure Evaluation (Signed)
  Anesthesia Post-op Note  Patient: [name] [name]  Procedure(s) Performed: Procedure(s) (LRB): INTRAMEDULLARY (IM) NAIL FEMORAL (Right)  Patient Location: PACU  Anesthesia Type: General  Level of Consciousness: awake and alert   Airway and Oxygen Therapy: Patient Spontanous Breathing  Post-op Pain: mild  Post-op Assessment: Post-op Vital signs reviewed, Patient's Cardiovascular Status Stable, Respiratory Function Stable, Patent Airway and No signs of Nausea or vomiting  Last Vitals:  Filed Vitals:   05/15/12 1310  BP:   Pulse: 59  Temp:   Resp: 13    Post-op Vital Signs: stable   Complications: No apparent anesthesia complications

## 2012-05-15 NOTE — H&P (Signed)
[facility] History and Physical  [name] [name] XLK:[id number] DOB: October 19, 1960 DOA: 05/14/2012  Referring physician: Dr Ashley Mariner PCP: No primary provider on file.   Chief Complaint: fall  at home HPI:  51 y/o female with mild osteoarthritis who is fairly active  presented to ED after tripping over her dg at home and sustained a fall. In the ED she had x ray of her hip showing right proximal intertrochanteric fracture with valgus deformity. Patient given IV dilaudid and her pain is well controlled at present. She denies any chest pain, palpitations, SOB, fever, chills, dizziness, nausea, vomiting bowel or urinary symptoms. Labs unremarkable. Ortho consult was called and Dr Ave Filter recommended admitting to hospitalist service and requested pre op clearance. Patient planned for OR on 11/24 and recommended to keep NPO.   Review of Systems:  Constitutional: Denies fever, chills, diaphoresis, appetite change and fatigue.  HEENT: Denies photophobia, eye pain, redness, hearing loss, ear pain, congestion, sore throat, rhinorrhea, sneezing, mouth sores, trouble swallowing, neck pain, neck stiffness and tinnitus.   Respiratory: Denies SOB, DOE, cough, chest tightness,  and wheezing.   Cardiovascular: Denies chest pain, palpitations and leg swelling.  Gastrointestinal: Denies nausea, vomiting, abdominal pain, diarrhea, constipation, blood in stool and abdominal distention.  Genitourinary: Denies dysuria, urgency, frequency, hematuria, flank pain and difficulty urinating.  Musculoskeletal:severe pain over right hip with limited movement .  Denies myalgias, back pain, joint swelling, arthralgias   Skin: Denies pallor, rash and wound.  Neurological: Denies dizziness, seizures, syncope, weakness, light-headedness, numbness and headaches.  Hematological: Denies adenopathy. Easy bruising, personal or family bleeding history  Psychiatric/Behavioral: Denies suicidal ideation, mood changes, confusion,  nervousness, sleep disturbance and agitation   History reviewed. No pertinent past medical history. History reviewed. No pertinent past surgical history. Social History:  reports that she has never smoked. She does not have any smokeless tobacco history on file. She reports that she does not drink alcohol or use illicit drugs.  No Known Allergies  Family hx: not asked  Prior to Admission medications   Medication Sig Start Date End Date Taking? Authorizing Provider  calcium-vitamin D (OSCAL WITH D) 500-200 MG-UNIT per tablet Take 1 tablet by mouth daily.   Yes Historical Provider, MD  Glucosamine 500 MG CAPS Take 2 capsules by mouth daily.   Yes Historical Provider, MD  ibuprofen (ADVIL,MOTRIN) 200 MG tablet Take 400 mg by mouth every 6 (six) hours as needed. pain   Yes Historical Provider, MD    Physical Exam:  Filed Vitals:   05/14/12 2106 05/14/12 2331  BP: 149/72 141/70  Pulse: 74 85  Temp: 98.4 F (36.9 C)   TempSrc: Oral   Resp: 16 16  SpO2: 100% 99%    Constitutional: Vital signs reviewed.  Patient is a well-developed and well-nourished in no acute distress and cooperative with exam. Alert and oriented x3.  Head: Normocephalic and atraumatic Ear: TM normal bilaterally Mouth: no erythema or exudates, MMM Eyes: PERRL, EOMI, conjunctivae normal, No scleral icterus.  Neck: Supple, Trachea midline normal ROM, No JVD, mass, thyromegaly, or carotid bruit present.  Cardiovascular: RRR, S1 normal, S2 normal, no MRG, pulses symmetric and intact bilaterally Pulmonary/Chest: CTAB, no wheezes, rales, or rhonchi Abdominal: Soft. Non-tender, non-distended, bowel sounds are normal, no masses, organomegaly, or guarding present.  GU: no CVA tenderness Musculoskeletal:swelling over right hip with right leg shortened and externally rotated.  No joint deformities, erythema, or stiffness, ROM full and no nontender Ext: no edema and no cyanosis, pulses palpable bilaterally (DP  and  PT) Hematology: no cervical, inginal, or axillary adenopathy.  Neurological: A&O x3, Strenght is normal and symmetric bilaterally, cranial nerve II-XII are grossly intact, no focal motor deficit, sensory intact to light touch bilaterally.  Skin: Warm, dry and intact. No rash, cyanosis, or clubbing.  Psychiatric: Normal mood and affect. speech and behavior is normal. Judgment and thought content normal. Cognition and memory are normal.   Labs on Admission:  Basic Metabolic Panel:  Lab 05/14/12 1610  NA 138  K 3.2*  CL 102  CO2 24  GLUCOSE 129*  BUN 21  CREATININE 0.69  CALCIUM 9.7  MG --  PHOS --   Liver Function Tests: No results found for this basename: AST:5,ALT:5,ALKPHOS:5,BILITOT:5,PROT:5,ALBUMIN:5 in the last 168 hours No results found for this basename: LIPASE:5,AMYLASE:5 in the last 168 hours No results found for this basename: AMMONIA:5 in the last 168 hours CBC:  Lab 05/14/12 2255  WBC 10.0  NEUTROABS 8.3*  HGB 12.7  HCT 36.5  MCV 91.5  PLT 241   Cardiac Enzymes: No results found for this basename: CKTOTAL:5,CKMB:5,CKMBINDEX:5,TROPONINI:5 in the last 168 hours BNP: No components found with this basename: POCBNP:5 CBG: No results found for this basename: GLUCAP:5 in the last 168 hours  Radiological Exams on Admission: Dg Femur Right  05/14/2012  *RADIOLOGY REPORT*  Clinical Data: Right femoral pain after fall.  RIGHT FEMUR - 2 VIEW  Comparison: None.  Findings: There is an impacted intertrochanteric fracture of the right hip with valgus angulation of the fracture fragments. The distal femur appears intact.  No focal bone lesion or bone destruction.  IMPRESSION: Intertrochanteric fracture of the right proximal femur with valgus orientation.   Original Report Authenticated By: Burman Nieves, M.D.    Dg Chest Port 1 View  05/14/2012  *RADIOLOGY REPORT*  Clinical Data: Preop.  Nonsmoker.  PORTABLE CHEST - 1 VIEW  Comparison: None.  Findings: The heart size and  pulmonary vascularity are normal. The lungs appear clear and expanded without focal air space disease or consolidation. No blunting of the costophrenic angles.  No pneumothorax.  Mediastinal contours appear intact.  IMPRESSION: No evidence of active pulmonary disease.   Original Report Authenticated By: Burman Nieves, M.D.     EKG:NSR @73  with 1st degree AV block. No old EKG in the system  Assessment/Plan Principal Problem:  *Intertrochanteric fracture of right femur Admit to medicine Pain control with IV morphine q 3 hr prn NPO for possible OR in am Dr Ave Filter will consult Patient has intermediate risk surgery and has no significant cardiac risks thus putting her for very low perioperative cardiac risk. Patient is not on beta blocker at home and given absence of any significant clinical  risk factors for surgery i would not start her on it. Her EKG shows a 1st degree heart block. No old to compare. Will monitor - holding DVT prophylaxis for now. Post op pain regimen and DVT prophylaxis per surgery.  -PT post op  Active Problems:  Hypokalemia Will replenish .   Osteoarthritis  resume home meds    Code Status: full Family Communication: husband at bedside Disposition Plan: SNF vs home with PT post op  Eddie North [facility] Pager [phone]  If 7PM-7AM, please contact night-coverage [email or web address] Password [facility] 05/15/2012, 12:17 AM      Total time spent : 55 minutes

## 2012-05-15 NOTE — Progress Notes (Signed)
Patient seen and examined; currently w/o significant pain on her right hip. She has been admitted after experiencing a mechanical fall at home with subsequent excruciating pain on her right hip. X-ray demonstrating right hip proximal intertrochanteric fracture. Please referred to admitting not by Dr. Gonzella Lex for more details. Plan is for surgery today. Since she is young for this kind of fractures will check TSH, phosphorus and MG level; also a vit D level and will recommend Densa -scan as an outpatient to r/o osteoporosis.  Will replete electrolytes and determine rehabilitation needs post surgery with PT evaluation.  [name] [name] [phone]

## 2012-05-15 NOTE — Progress Notes (Signed)
PHARMACIST - PHYSICIAN ORDER COMMUNICATION  CONCERNING: P&T Medication Policy on Herbal Medications  DESCRIPTION:  This patient's order for:  Glucosamine  has been noted.  This product(s) is classified as an "herbal" or natural product. Due to a lack of definitive safety studies or FDA approval, nonstandard manufacturing practices, plus the potential risk of unknown drug-drug interactions while on inpatient medications, the Pharmacy and Therapeutics Committee does not permit the use of "herbal" or natural products of this type within [facility].   ACTION TAKEN: The pharmacy department is unable to verify this order at this time and your patient has been informed of this safety policy. Please reevaluate patient's clinical condition at discharge and address if the herbal or natural product(s) should be resumed at that time.   Thank you, Terrilee Files, PharmD

## 2012-05-15 NOTE — Transfer of Care (Signed)
Immediate Anesthesia Transfer of Care Note  Patient: [name] [name]  Procedure(s) Performed: Procedure(s) (LRB) with comments: INTRAMEDULLARY (IM) NAIL FEMORAL (Right)  Patient Location: PACU  Anesthesia Type:General  Level of Consciousness: awake and alert   Airway & Oxygen Therapy: Patient Spontanous Breathing and Patient connected to face mask oxygen  Post-op Assessment: Report given to PACU RN and Post -op Vital signs reviewed and stable  Post vital signs: Reviewed and stable  Complications: No apparent anesthesia complications

## 2012-05-15 NOTE — Op Note (Signed)
Procedure(s): INTRAMEDULLARY (IM) NAIL FEMORAL Procedure Note  [name] [name] female 51 y.o. 05/15/2012  Procedure(s) and Anesthesia Type:    * RIGHT TROCH INTRAMEDULLARY (IM) NAIL FEMORAL - General  Surgeon(s) and Role:    * Mable Paris, MD - Primary   Indications:  51 y.o. female s/p fall with right hip fracture. Indicated for surgery to promote early ambulation, pain control and prevent complications of bed rest.     Surgeon: Mable Paris   Assistants: Damita Lack PA-C (Danielle was present and scrubbed throughout the procedure and was essential in positioning, retraction, exposure, and closure)  Anesthesia: General endotracheal anesthesia    Procedure Detail  INTRAMEDULLARY (IM) NAIL FEMORAL  Findings: DePuy affixus 11x 360 trochanter entry nail with one distal interlocking screw. Bone quality was good. I did send some proximal reamings secondary to her young age for this type of fracture to rule out any potential pathologic cause, even if she did have a traumatic event.  Estimated Blood Loss:  200 mL         Drains: none  Blood Given: none          Specimens: none        Complications:  * No complications entered in OR log *         Disposition: PACU - hemodynamically stable.         Condition: stable    Procedure:  The patient was identified in the preoperative holding area  where I personally marked the operative site after verifying site, side,  and procedure with the patient. She was taken back to the operating  room where general anesthesia was induced without complication. She was  placed on the fracture table with the right lower extremity in traction,  and opposite lower extremity in a flexed abducted position. The arms were well  padded. Fluoroscopic imaging was used to verify reduction with gentle traction  and internal rotation. The right hip was then prepped and draped in the standard sterile fashion. An  approximately 3 cm incision was made proximal  to the palpable greater trochanter tip. Dissection was carried down to  the tip and the short guidewire was placed under fluoroscopic imaging.  The proximal entry reamer was used to open the canal and the ball-tipped  guidewire was then placed and advanced down the femoral canal. The reamings from the proximal entry reamer were sent for pathologic examination.  The ball-tipped guidewire was  used to obtain a measurement for the implant and then was subsequently  over reamed up to 12.5 mm by 0.5 mm increments. The 11 mm nail was then  advanced over the guidewire without difficulty and the proximal jig was  then placed and a small 1.5 cm incision was made on the lateral thigh to  advance the lag screw guide against the lateral aspect of the femur.  The guide pin was advanced and its position was verified in AP and  lateral planes to be centered in the head.  The guidewire was over  reamed and the appropriate size lag screw was advanced. The  proximal set screw was then advanced, backed off a quarter turn to allow  sliding. AP and lateral imaging demonstrated appropriate position of  the screw and reduction of the fracture. The proximal jig was then  removed. Attention was turned to the knee. Where using perfect  circles technique on the lateral x-ray, one distal interlocking screw  was placed from lateral to medial through a percutaneous incision.  Final fluoroscopic imaging in AP and lateral planes at the hip and the  knee demonstrated near anatomic reduction with appropriate length and  position of the hardware. All wounds were then copiously irrigated with  normal saline and subsequently closed in layers with #1 Vicryl in a deep  fascia layer, 2-0 Vicryl in a deep dermal layer, and Monocryl for skin  closure. Sterile dressings were then applied including 4x4s and Mepilex  dressings. The patient was then taken off the fracture table,    transferred to the stretcher, and taken to the recovery room in stable  condition after she was extubated.   POSTOPERATIVE PLAN: She will be weightbearing as tolerated on the operative extremity. She will have DVT prophylaxis of SCDs and aspirin

## 2012-05-15 NOTE — Progress Notes (Signed)
   CARE MANAGEMENT NOTE 05/15/2012  Patient:  [name],[name]   Account Number:  [id number]  Date Initiated:  05/15/2012  Documentation initiated by:  [name] [name]  Subjective/Objective Assessment:   Request for CM to follow for progressiona nd d/c needs, live at home with husband. 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     Action/Plan:   Will await Pt/OT recommendations, s/p surgery 05/16/2011   Anticipated DC Date:  05/18/2012   Anticipated DC Plan:  HOME W HOME HEALTH SERVICES         Choice offered to / List presented to:          [facility] arranged  HH-2 PT      Status of service:  In process, will continue to follow Medicare Important Message given?   (If response is "NO", the following Medicare IM given date fields will be blank) Date Medicare IM given:   Date Additional Medicare IM given:    Discharge Disposition:  HOME W HOME HEALTH SERVICES  Per UR Regulation:    If discussed at Long Length of Stay Meetings, dates discussed:    Comments:

## 2012-05-16 DIAGNOSIS — S72143A Displaced intertrochanteric fracture of unspecified femur, initial encounter for closed fracture: Principal | ICD-10-CM

## 2012-05-16 DIAGNOSIS — E876 Hypokalemia: Secondary | ICD-10-CM

## 2012-05-16 DIAGNOSIS — K5900 Constipation, unspecified: Secondary | ICD-10-CM

## 2012-05-16 DIAGNOSIS — D62 Acute posthemorrhagic anemia: Secondary | ICD-10-CM

## 2012-05-16 LAB — COMPREHENSIVE METABOLIC PANEL
AST: 22 U/L (ref 0–37)
BUN: 11 mg/dL (ref 6–23)
CO2: 25 mEq/L (ref 19–32)
Chloride: 106 mEq/L (ref 96–112)
Creatinine, Ser: 0.56 mg/dL (ref 0.50–1.10)
GFR calc non Af Amer: >90 mL/min (ref >90)
Total Bilirubin: 0.3 mg/dL (ref 0.3–1.2)

## 2012-05-16 LAB — CBC
MCH: 31.6 pg (ref 26.0–34.0)
Platelets: 194 10*3/uL (ref 150–400)
RBC: 3.07 MIL/uL — ABNORMAL LOW (ref 3.87–5.11)

## 2012-05-16 LAB — PHOSPHORUS: Phosphorus: 2 mg/dL — ABNORMAL LOW (ref 2.3–4.6)

## 2012-05-16 LAB — TSH: TSH: 1.917 u[IU]/mL (ref 0.350–4.500)

## 2012-05-16 MED ORDER — FERROUS FUMARATE 325 (106 FE) MG PO TABS
1.0000 | ORAL_TABLET | Freq: Two times a day (BID) | ORAL | Status: DC
  Administered 2012-05-16 – 2012-05-17 (×3): 106 mg via ORAL
  Filled 2012-05-16 (×4): qty 1

## 2012-05-16 MED ORDER — HYDROCODONE-ACETAMINOPHEN 5-325 MG PO TABS: 1.0000 | ORAL_TABLET | ORAL | Status: AC | PRN

## 2012-05-16 MED ORDER — POTASSIUM PHOSPHATE DIBASIC 3 MMOLE/ML IV SOLN
20.0000 meq | Freq: Once | INTRAVENOUS | Status: AC
  Administered 2012-05-16: 20 meq via INTRAVENOUS
  Filled 2012-05-16: qty 4.55

## 2012-05-16 MED ORDER — CALCIUM CARBONATE-VITAMIN D 500-200 MG-UNIT PO TABS
1.0000 | ORAL_TABLET | Freq: Two times a day (BID) | ORAL | Status: DC
  Administered 2012-05-16 – 2012-05-17 (×2): 1 via ORAL
  Filled 2012-05-16 (×3): qty 1

## 2012-05-16 MED ORDER — ASPIRIN 325 MG PO TBEC: 325.0000 mg | DELAYED_RELEASE_TABLET | Freq: Two times a day (BID) | ORAL | Status: DC

## 2012-05-16 MED ORDER — ASPIRIN 325 MG PO TBEC: 325.0000 mg | DELAYED_RELEASE_TABLET | Freq: Two times a day (BID) | ORAL | Status: AC

## 2012-05-16 MED ORDER — POLYETHYLENE GLYCOL 3350 17 G PO PACK
17.0000 g | PACK | Freq: Every day | ORAL | Status: DC
  Administered 2012-05-16 – 2012-05-17 (×2): 17 g via ORAL

## 2012-05-16 NOTE — Progress Notes (Signed)
Discharge summary sent to payer through [facility]  

## 2012-05-16 NOTE — Progress Notes (Signed)
PATIENT ID: [name] [name]   1 Day Post-Op Procedure(s) (LRB): INTRAMEDULLARY (IM) NAIL FEMORAL (Right)  Subjective: Doing very well, minimal pain.  Eager to get moving.  Objective:  Filed Vitals:   05/16/12 0410  BP: 107/67  Pulse: 85  Temp: 98.8 F (37.1 C)  Resp: 16     RLE dressings C/D/I, NVID, no calf TTP or swelling.  Labs:   [facility] 05/16/12 0418 05/14/12 2255  HGB 9.7* 12.7   Basename 05/16/12 0418 05/14/12 2255  WBC 9.6 10.0  RBC 3.07* 3.99  HCT 28.4* 36.5  PLT 194 241   Basename 05/16/12 0418 05/14/12 2255  NA 138 138  K 4.0 3.2*  CL 106 102  CO2 25 24  BUN 11 21  CREATININE 0.56 0.69  GLUCOSE 150* 129*  CALCIUM 8.4 9.7    Assessment and Plan:s/p IMN R hip fx WBAT ECASA/SCDs for DVT proph Up with PT today, likely could d/c home tomorrow.

## 2012-05-16 NOTE — Progress Notes (Signed)
[facility] PROGRESS NOTE  [name] [name] WUJ:[id number] DOB: 1961-05-07 DOA: 05/14/2012 PCP: No primary provider on file.  Assessment/Plan: 1-Right hip fracture: per ortho. POD #1; pain is well controlled and patient is eager to start moving. Will follow ortho recommendations for DVT (SCD's and ASA). Continue Oscal BID and follow Vit D level results.  2-ABLA: Hgb 9.7 down from 12.7. No need for transfusion at this point. Will start iron pills and follow Hgb trend.  3-Hypokalemia: repleted. Mg WNL.  4-Hypophosphatemia: Po4 is 2.0. Will replete.  5-Constipation: will use colace and miralax.  DVT: SCD's and ASA (325 mg BID)  Code Status: Full Family Communication: husband at bedside Disposition Plan: most likely home with HHPT; but will follow PT recommendations   Consultants:  Ortho  PT  Procedures:  Right intramedullary nail   Antibiotics:  Cefazolin; given for prophylaxis   HPI/Subjective: Afebrile; minimal pain; no CP, no SOB. Patient w/o BM.  Objective: Filed Vitals:   05/16/12 0126 05/16/12 0400 05/16/12 0410 05/16/12 0800  BP: 102/85  107/67   Pulse: 86  85   Temp: 98.8 F (37.1 C)  98.8 F (37.1 C)   TempSrc: Oral  Oral   Resp: 20 18 16 16   Height:      Weight:      SpO2: 100% 98% 99% 99%    Intake/Output Summary (Last 24 hours) at 05/16/12 0850 Last data filed at 05/16/12 0820  Gross per 24 hour  Intake 3993.75 ml  Output   2250 ml  Net 1743.75 ml   Filed Weights   05/15/12 0030  Weight: 58.968 kg (130 lb)    Exam:   General:  NAD, afebrile, cooperative to examination  Cardiovascular: S1 and S2, no rubs, no murmurs, no gallops  Respiratory: CTA bilaterally  Abdomen: soft, NT, ND, positive BS  Extremities: no edema, no cyanosis; mild pain with movement on right hip (but well controlled); patient eager to move and start PT  Neuro: non focal.  Data Reviewed: Basic Metabolic Panel:  Lab 05/16/12 9562 05/14/12 2255  NA 138 138   K 4.0 3.2*  CL 106 102  CO2 25 24  GLUCOSE 150* 129*  BUN 11 21  CREATININE 0.56 0.69  CALCIUM 8.4 9.7  MG 1.9 --  PHOS 2.0* --   Liver Function Tests:  Lab 05/16/12 0418  AST 22  ALT 9  ALKPHOS 38*  BILITOT 0.3  PROT 5.3*  ALBUMIN 2.9*   CBC:  Lab 05/16/12 0418 05/14/12 2255  WBC 9.6 10.0  NEUTROABS -- 8.3*  HGB 9.7* 12.7  HCT 28.4* 36.5  MCV 92.5 91.5  PLT 194 241    Recent Results (from the past 240 hour(s))  SURGICAL PCR SCREEN     Status: Abnormal   Collection Time   05/15/12  8:40 AM      Component Value Range Status Comment   MRSA, PCR INVALID RESULTS, SPECIMEN SENT FOR CULTURE (*) NEGATIVE Final    Staphylococcus aureus INVALID RESULTS, SPECIMEN SENT FOR CULTURE (*) NEGATIVE Final      Studies: Dg Femur Right  05/15/2012  *RADIOLOGY REPORT*  Clinical Data: Fixation of proximal femur fracture.  RIGHT FEMUR - 2 VIEW  Comparison: 05/14/2012  Findings: 4 intraoperative views.  These demonstrate placement of intermedullary rod and screw fixation device across the previously described intertrochanteric femur fracture.  No acute hardware complication identified.  IMPRESSION: Intraoperative imaging of proximal femoral fixation.   Original Report Authenticated By: Jeronimo Greaves, M.D.  Dg Femur Right  05/14/2012  *RADIOLOGY REPORT*  Clinical Data: Right femoral pain after fall.  RIGHT FEMUR - 2 VIEW  Comparison: None.  Findings: There is an impacted intertrochanteric fracture of the right hip with valgus angulation of the fracture fragments. The distal femur appears intact.  No focal bone lesion or bone destruction.  IMPRESSION: Intertrochanteric fracture of the right proximal femur with valgus orientation.   Original Report Authenticated By: Burman Nieves, M.D.    Dg Chest Port 1 View  05/14/2012  *RADIOLOGY REPORT*  Clinical Data: Preop.  Nonsmoker.  PORTABLE CHEST - 1 VIEW  Comparison: None.  Findings: The heart size and pulmonary vascularity are normal. The  lungs appear clear and expanded without focal air space disease or consolidation. No blunting of the costophrenic angles.  No pneumothorax.  Mediastinal contours appear intact.  IMPRESSION: No evidence of active pulmonary disease.   Original Report Authenticated By: Burman Nieves, M.D.    Dg Femur Right Port  05/15/2012  *RADIOLOGY REPORT*  Clinical Data: Status post fracture fixation.  PORTABLE RIGHT FEMUR - 2 VIEW  Comparison: Plain films 05/14/2012.  Findings: The patient has a new dynamic hip screw and long IM nail with a single distal interlocking screw for fixation of an intertrochanteric fracture.  Hardware is intact.  No acute fracture is identified.  Position and alignment of the intertrochanteric fracture are near anatomic.  Gas in the soft tissues from surgery noted.  IMPRESSION: ORIF right intertrochanteric fracture without evidence of complication.   Original Report Authenticated By: Holley Dexter, M.D.    Dg C-arm 1-60 Min-no Report  05/15/2012  CLINICAL DATA: right hip fracture   C-ARM 1-60 MINUTES  Fluoroscopy was utilized by the requesting physician.  No radiographic  interpretation.      Scheduled Meds:   . [EXPIRED] sodium chloride   Intravenous STAT  . aspirin EC  325 mg Oral BID  . calcium-vitamin D  1 tablet Oral BID  . [COMPLETED]  ceFAZolin (ANCEF) IV  2 g Intravenous 60 min Pre-Op  . [COMPLETED]  ceFAZolin (ANCEF) IV  2 g Intravenous Q6H  . docusate sodium  100 mg Oral BID  . ferrous fumarate  1 tablet Oral BID  . potassium phosphate IVPB (mEq)  20 mEq Intravenous Once  . [DISCONTINUED] calcium-vitamin D  1 tablet Oral Daily   Continuous Infusions:   . sodium chloride 100 mL/hr at 05/15/12 0609  . dextrose 5 % and 0.45 % NaCl with KCl 20 mEq/L 100 mL/hr at 05/16/12 0411  . [DISCONTINUED] sodium chloride 125 mL/hr at 05/15/12 0155  . [DISCONTINUED] lactated ringers      Time spent: >30 minutes    [name]  [facility] Pager [phone]. If  8PM-8AM, please contact night-coverage at [email or web address], password [facility] 05/16/2012, 8:50 AM  LOS: 2 days

## 2012-05-16 NOTE — Evaluation (Signed)
Physical Therapy Evaluation Patient Details Name: [name] [name] MRN: [id number] DOB: 1961/03/31 Today's Date: 05/16/2012 Time: 7829-5621 PT Time Calculation (min): 12 min  PT Assessment / Plan / Recommendation Clinical Impression  Pt s/p R IM nail due fx from fall over dogs at home.  Pt would benefit from acute PT services in order to improve independence with ambulation and stairs by increasing mobility and R LE strength.      PT Assessment  Patient needs continued PT services    Follow Up Recommendations  Home health PT (hopefully quick progression to outpatient)    Does the patient have the potential to tolerate intense rehabilitation      Barriers to Discharge        Equipment Recommendations  Rolling walker with 5" wheels;3 in 1 bedside comode    Recommendations for Other Services     Frequency 7X/week    Precautions / Restrictions Precautions Precautions: None Restrictions Weight Bearing Restrictions: Yes RLE Weight Bearing: Weight bearing as tolerated   Pertinent Vitals/Pain 5/10 incision discomfort, premedicated      Mobility  Bed Mobility Bed Mobility: Supine to Sit Supine to Sit: 5: Supervision;HOB elevated Details for Bed Mobility Assistance: verbal cues to use UEs to assist R LE Transfers Transfers: Sit to Stand;Stand to Sit Sit to Stand: 4: Min guard;With upper extremity assist;From bed Stand to Sit: 5: Supervision;With upper extremity assist;To chair/3-in-1 Details for Transfer Assistance: verbal cues for hand placement Ambulation/Gait Ambulation/Gait Assistance: 4: Min guard;5: Supervision Ambulation Distance (Feet): 140 Feet Assistive device: Rolling walker Ambulation/Gait Assistance Details: pt able to perform sequence without cues, verbal cue for RW distance and turning toward better LE Gait Pattern: Step-to pattern;Trunk flexed;Decreased stride length    Shoulder Instructions     Exercises     PT Diagnosis: Acute pain;Difficulty walking    PT Problem List: Decreased strength;Decreased mobility;Decreased knowledge of use of DME;Pain PT Treatment Interventions: DME instruction;Gait training;Stair training;Functional mobility training;Therapeutic activities;Therapeutic exercise;Patient/family education   PT Goals Acute Rehab PT Goals PT Goal Formulation: With patient Time For Goal Achievement: 05/20/12 Potential to Achieve Goals: Good Pt will go Sit to Stand: with modified independence PT Goal: Sit to Stand - Progress: Goal set today Pt will go Stand to Sit: with modified independence PT Goal: Stand to Sit - Progress: Goal set today Pt will Ambulate: 51 - 150 feet;with modified independence;with least restrictive assistive device PT Goal: Ambulate - Progress: Goal set today Pt will Go Up / Down Stairs: 6-9 stairs;with least restrictive assistive device;with modified independence PT Goal: Up/Down Stairs - Progress: Goal set today Pt will Perform Home Exercise Program: Independently PT Goal: Perform Home Exercise Program - Progress: Goal set today  Visit Information  Last PT Received On: 05/16/12 Assistance Needed: +1    Subjective Data  Subjective: I think you need to help with my gown.  We tried to put it on ourselves and it doesn't look right.   Prior Functioning  Home Living Lives With: Spouse Type of Home: House Home Access: Stairs to enter Secretary/administrator of Steps: 6 Entrance Stairs-Rails: None Home Layout: Two level Alternate Level Stairs-Number of Steps: flight Bathroom Shower/Tub: Engineer, manufacturing systems: Standard Home Adaptive Equipment: Quad cane Additional Comments: plans to stay on first floor Prior Function Level of Independence: Independent Communication Communication: No difficulties Dominant Hand: Right    Cognition  Overall Cognitive Status: Appears within functional limits for tasks assessed/performed Arousal/Alertness: Awake/alert Orientation Level: Oriented X4 /  Intact Behavior During Session: [facility]  for tasks performed    Extremity/Trunk Assessment Right Upper Extremity Assessment RUE ROM/Strength/Tone: [facility] for tasks assessed Left Upper Extremity Assessment LUE ROM/Strength/Tone: WFL for tasks assessed Right Lower Extremity Assessment RLE ROM/Strength/Tone: Deficits RLE ROM/Strength/Tone Deficits: decreased hip strength per functional observation Left Lower Extremity Assessment LLE ROM/Strength/Tone: WFL for tasks assessed   Balance    End of Session PT - End of Session Activity Tolerance: Patient tolerated treatment well Patient left: in chair;with call bell/phone within reach;with family/visitor present  GP     [name] [name],[name] [name] 05/16/2012, 11:20 AM Pager: [phone]

## 2012-05-16 NOTE — Progress Notes (Signed)
Physical Therapy Treatment Note   05/16/12 1500  PT Visit Information  Last PT Received On 05/16/12  Assistance Needed +1  PT Time Calculation  PT Start Time 1343  PT Stop Time 1409  PT Time Calculation (min) 26 min  Subjective Data  Subjective Let's try stairs today.  Precautions  Precautions None  Restrictions  RLE Weight Bearing WBAT  Cognition  Overall Cognitive Status Appears within functional limits for tasks assessed/performed  Bed Mobility  Bed Mobility Supine to Sit;Sit to Supine  Supine to Sit 5: Supervision;HOB elevated  Sit to Supine 4: Min assist;HOB elevated  Details for Bed Mobility Assistance assist onto bed due to pain  Transfers  Transfers Sit to Stand;Stand to Sit  Sit to Stand 5: Supervision;With upper extremity assist;From bed  Stand to Sit 5: Supervision;With upper extremity assist;To bed  Details for Transfer Assistance verbal cues for hand placement  Ambulation/Gait  Ambulation/Gait Assistance 5: Supervision  Ambulation Distance (Feet) 200 Feet  Assistive device Rolling walker  Ambulation/Gait Assistance Details pt ambulated to/from stairs, doing well with ambulation  Gait Pattern Step-to pattern;Trunk flexed;Decreased stride length  Stairs Yes  Stairs Assistance 4: Min guard  Stairs Assistance Details (indicate cue type and reason) verbal cues for sequence, HHA to steady with ascending however needed for support with descending  Stair Management Technique Step to pattern;Forwards;One rail Left (and HHA)  Number of Stairs 4   Exercises  Exercises Total Joint  Total Joint Exercises  Ankle Circles/Pumps AROM;20 reps;Both  Quad Sets AROM;15 reps;Both  Gluteal Sets AROM;20 reps;Both  Towel Squeeze AROM;Both;15 reps  Heel Slides AAROM;Right;15 reps  Hip ABduction/ADduction AAROM;Right;15 reps  Straight Leg Raises AAROM;Right;15 reps  PT - End of Session  Activity Tolerance Patient tolerated treatment well  Patient left in bed;with call bell/phone  within reach;with family/visitor present  PT - Assessment/Plan  Comments on Treatment Session Pt ambulated to/from stairs and practiced stairs.  Pt then assisted back to bed and performed exercises.  PT Plan Discharge plan remains appropriate;Frequency remains appropriate  Follow Up Recommendations Home health PT  Equipment Recommended Rolling walker with 5" wheels;3 in 1 bedside comode  Acute Rehab PT Goals  PT Goal: Sit to Stand - Progress Progressing toward goal  PT Goal: Stand to Sit - Progress Progressing toward goal  PT Goal: Ambulate - Progress Progressing toward goal  Pt will Go Up / Down Stairs 3-5 stairs;with supervision;with least restrictive assistive device;with rail(s) (pt reports only 4 steps to enter)  PT Goal: Up/Down Stairs - Progress Progressing toward goal  PT Goal: Perform Home Exercise Program - Progress Progressing toward goal  PT General Charges  $$ ACUTE PT VISIT 1 Procedure  PT Treatments  $Gait Training 8-22 mins  $Therapeutic Exercise 8-22 mins    Pain: 3/10 R hip, ice applied  Zenovia Jarred, PT Pager: [phone]

## 2012-05-16 NOTE — Evaluation (Signed)
Occupational Therapy Evaluation Patient Details Name: [name] [name] MRN: [id number] DOB: 29-Jan-1961 Today's Date: 05/16/2012 Time: 4098-1191 OT Time Calculation (min): 12 min  OT Assessment / Plan / Recommendation Clinical Impression  This 51 year old female sustained a R femur fx that was surgically managed with an IM nail when she tripped over her dog.  All education was completed, and pt has assist at home.  No further OT needs at this time.      OT Assessment  Patient does not need any further OT services    Follow Up Recommendations  No OT follow up    Barriers to Discharge      Equipment Recommendations  3 in 1 bedside comode    Recommendations for Other Services    Frequency       Precautions / Restrictions Precautions Precautions: None Restrictions Weight Bearing Restrictions: Yes RLE Weight Bearing: Weight bearing as tolerated   Pertinent Vitals/Pain No pain when sitting in chair, increased when standing/ambulating then none again when she returned to chair    ADL  Toilet Transfer: Performed;Supervision/safety Toilet Transfer Method: Sit to stand Toilet Transfer Equipment: Raised toilet seat with arms (or 3-in-1 over toilet) Toileting - Clothing Manipulation and Hygiene: Performed;Modified independent Where Assessed - Toileting Clothing Manipulation and Hygiene: Sit to stand from 3-in-1 or toilet Transfers/Ambulation Related to ADLs: pt ambulated to bathroom.  Educated on tub readiness:  she will sponge bathe a couple of days if needed ADL Comments: Husband will help with adls.  Pt is able to reach to ankles so will only need assist with socks.  Educated on AE (long sponge).  Husband will be with her all the time.      OT Diagnosis:    OT Problem List:   OT Treatment Interventions:     OT Goals    Visit Information  Last OT Received On: 05/16/12 Assistance Needed: +1    Subjective Data  Subjective: I may need something on my toilet Patient Stated Goal:  none stated   Prior Functioning     Home Living Lives With: Spouse Bathroom Shower/Tub: Engineer, manufacturing systems: Standard Additional Comments: plans to stay on first floor Prior Function Level of Independence: Independent Communication Communication: No difficulties Dominant Hand: Right         Vision/Perception     Cognition  Overall Cognitive Status: Appears within functional limits for tasks assessed/performed Arousal/Alertness: Awake/alert Orientation Level: Oriented X4 / Intact Behavior During Session: [facility] for tasks performed    Extremity/Trunk Assessment Right Upper Extremity Assessment RUE ROM/Strength/Tone: [facility] for tasks assessed Left Upper Extremity Assessment LUE ROM/Strength/Tone: WFL for tasks assessed     Mobility Transfers Transfers: Sit to Stand Sit to Stand: 5: Supervision     Shoulder Instructions     Exercise     Balance     End of Session OT - End of Session Activity Tolerance: Patient tolerated treatment well Patient left: in chair;with call bell/phone within reach;with family/visitor present Nurse Communication:  (diet upgrade)  GO     Takeya Marquis 05/16/2012, 10:37 AM Marica Otter, OTR/L [phone] 05/16/2012

## 2012-05-17 ENCOUNTER — Encounter (HOSPITAL_COMMUNITY): Payer: Self-pay | Admitting: Orthopedic Surgery

## 2012-05-17 LAB — CBC
HCT: 30.2 % — ABNORMAL LOW (ref 36.0–46.0)
Platelets: 203 10*3/uL (ref 150–400)
RDW: 13 % (ref 11.5–15.5)
WBC: 7.4 10*3/uL (ref 4.0–10.5)

## 2012-05-17 LAB — URINE CULTURE

## 2012-05-17 MED ORDER — DSS 100 MG PO CAPS: 100.0000 mg | ORAL_CAPSULE | Freq: Two times a day (BID) | ORAL | Status: AC

## 2012-05-17 MED ORDER — OMEPRAZOLE 40 MG PO CPDR: 40.0000 mg | DELAYED_RELEASE_CAPSULE | Freq: Every day | ORAL | Status: AC

## 2012-05-17 MED ORDER — IBUPROFEN 400 MG PO TABS
400.0000 mg | ORAL_TABLET | Freq: Once | ORAL | Status: AC
  Administered 2012-05-17: 400 mg via ORAL
  Filled 2012-05-17: qty 1

## 2012-05-17 MED ORDER — CALCIUM CARBONATE-VITAMIN D 500-200 MG-UNIT PO TABS: 2.0000 | ORAL_TABLET | Freq: Two times a day (BID) | ORAL | Status: AC

## 2012-05-17 MED ORDER — FERROUS FUMARATE 325 (106 FE) MG PO TABS: 1.0000 | ORAL_TABLET | Freq: Two times a day (BID) | ORAL | Status: AC

## 2012-05-17 NOTE — Progress Notes (Signed)
Ibuprofen ordered given as requested by pt. Awaiting for 3 in 1 BSC to be delivererd here then will d/c pt.

## 2012-05-17 NOTE — Care Management Note (Signed)
    Page 1 of 2   05/17/2012     12:33:13 PM   CARE MANAGEMENT NOTE 05/17/2012  Patient:  [name] [name], [name] [name]   Account Number:  [id number]  Date Initiated:  05/15/2012  Documentation initiated by:  [name] [name]  Subjective/Objective Assessment:   Request for CM to follow for progressiona nd d/c needs, live at home with husband. 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     Action/Plan:   Will await Pt/OT recommendations, s/p surgery 05/16/2011   Anticipated DC Date:  05/18/2012   Anticipated DC Plan:  HOME W HOME HEALTH SERVICES  In-house referral  NA      DC Planning Services  CM consult      [facility] Choice  HOME HEALTH  DURABLE MEDICAL EQUIPMENT   Choice offered to / List presented to:  C-1 Patient   DME arranged  3-N-1      DME agency  [facility]     HH arranged  HH-2 PT      [facility] agency  [facility]   Status of service:  Completed, signed off Medicare Important Message given?  NA - LOS <3 / Initial given by admissions (If response is "NO", the following Medicare IM given date fields will be blank) Date Medicare IM given:   Date Additional Medicare IM given:    Discharge Disposition:  HOME W HOME HEALTH SERVICES  Per UR Regulation:    If discussed at Long Length of Stay Meetings, dates discussed:    Comments:  05/17/2012 [name] Schooner RN CCM [phone] Pt plans home with spouse who will be caregiver. Needs 3N1; already has RW. Wants [name] [name] for HHPT. Spoke with [name] [name]-rep who states they will accept referral with start date of tomorrow-05/18/2012. Contact information for [name] [name] given to patient. 3n1 delivered to patient's room.

## 2012-05-17 NOTE — Progress Notes (Signed)
Physical Therapy Treatment Patient Details Name: [name] [name] MRN: [id number] DOB: 1961-01-14 Today's Date: 05/17/2012 Time: 4098-1191 PT Time Calculation (min): 27 min  PT Assessment / Plan / Recommendation Comments on Treatment Session  POD # 2 R IM Nail 2nd fall/fx.  Pt progressing well.  Given handout on HEP and performed all.  Instructed on use of ICE.  Instructed on use of pillows for positioning for comfort.  Instructed on proper tech to get in/ot car.  Instructed on shower tranfer tech.     Follow Up Recommendations  Home health PT     Does the patient have the potential to tolerate intense rehabilitation     Barriers to Discharge        Equipment Recommendations  Rolling walker with 5" wheels;3 in 1 bedside comode (Pt thinks she can use her fathers RW)    Recommendations for Other Services    Frequency 7X/week   Plan Discharge plan remains appropriate    Precautions / Restrictions Precautions Precautions: None Restrictions Weight Bearing Restrictions: No RLE Weight Bearing: Weight bearing as tolerated Other Position/Activity Restrictions: Pt aware    Pertinent Vitals/Pain Only c/o "swelling" R hip    Mobility  Bed Mobility Bed Mobility: Supine to Sit Details for Bed Mobility Assistance: Min Assist to support R LE up onto bed due to pain Transfers Transfers: Sit to Stand;Stand to Sit Sit to Stand: 6: Modified independent (Device/Increase time);From chair/3-in-1 Stand to Sit: 6: Modified independent (Device/Increase time);To bed Details for Transfer Assistance: good use of hands and increased time Ambulation/Gait Ambulation/Gait Assistance: 6: Modified independent (Device/Increase time) Ambulation Distance (Feet): 250 Feet Assistive device: Rolling walker Ambulation/Gait Assistance Details: good alternating gait with no LOB.  One VC during turns with reguards to safety and speed. Gait Pattern: Step-through pattern;Decreased stride length General Gait Details:  Pt performed stairs yesterday and felt good with that Stairs: No    Exercises Total Joint Exercises Ankle Circles/Pumps: AROM;Both;10 reps;Supine Quad Sets: AROM;Both;10 reps;Supine Gluteal Sets: AROM;Both;10 reps;Supine Towel Squeeze: AROM;Both;10 reps;Supine Short Arc Quad: AROM;Right;10 reps;Supine Heel Slides: AAROM;Right;10 reps;Supine Hip ABduction/ADduction: AAROM;Right;10 reps;Supine Long Arc Quad: AROM;Right;10 reps;Seated Knee Flexion: AROM;Right;10 reps;Standing Marching in Standing: AROM;Right;10 reps;Standing Standing Hip Extension: AROM;Right;10 reps;Standing   PT Goals                                                     progressing    Visit Information  Last PT Received On: 05/17/12 Assistance Needed: +1    Subjective Data  Subjective: I feel pretty good Patient Stated Goal: home   Cognition    good   Balance   good  End of Session PT - End of Session Equipment Utilized During Treatment: Gait belt Activity Tolerance: Patient tolerated treatment well Patient left: in bed;with call bell/phone within reach;with family/visitor present (ICE to R hip) Nurse Communication: Other (comment) (Pt ready for D/C to home)   Felecia Shelling  PTA [facility]  Acute  Rehab Pager     [phone]

## 2012-05-17 NOTE — Progress Notes (Signed)
PATIENT ID: [name] [name]   2 Days Post-Op Procedure(s) (LRB): INTRAMEDULLARY (IM) NAIL FEMORAL (Right)  Subjective: Feeling great. Very minimal pain. Has been up with PT with great success. Ready to go home. No complaints or concerns.  Objective:  Filed Vitals:   05/17/12 0800  BP:   Pulse:   Temp:   Resp: 16     A&O Right LE dressings c/d/i Wiggles toes Intact sensation to light touch distally Distally NVI No calf tenderness/swelling  Labs:   Basename 05/17/12 0410 05/16/12 0418 05/14/12 2255  HGB 10.1* 9.7* 12.7   Basename 05/17/12 0410 05/16/12 0418  WBC 7.4 9.6  RBC 3.23* 3.07*  HCT 30.2* 28.4*  PLT 203 194   Basename 05/16/12 0418 05/14/12 2255  NA 138 138  K 4.0 3.2*  CL 106 102  CO2 25 24  BUN 11 21  CREATININE 0.56 0.69  GLUCOSE 150* 129*  CALCIUM 8.4 9.7    Assessment and Plan: WBAT Okay for dc today from ortho standpoint Scripts for pain and ASA 325mg  BID x2 weeks in chart Fu with Dr. Ave Filter in 10-14 days  VTE proph: ASA 325mg  BID, SCDs

## 2012-05-17 NOTE — Progress Notes (Signed)
Discharge summary sent to payer through [facility]  

## 2012-05-17 NOTE — Progress Notes (Signed)
Pt for d/c home today with [facility] PT per [place]. Dressing CDI to R hip.Dressing supplies provided for home use. D/C instructions & Rx given with verbalized understanding. Husband at bedside to assist with discharge.

## 2012-05-17 NOTE — Discharge Summary (Signed)
Physician Discharge Summary  Trine Fread ZOX:[id number] DOB: Jul 22, 1960 DOA: 05/14/2012  PCP: No primary provider on file.  Admit date: 05/14/2012 Discharge date: 05/17/2012  Time spent: >30 minutes  Recommendations for Outpatient Follow-up:  -Weight bearing as tolerated -Follow Physical therapy instructions and recommendations -Take medications as prescribed -Follow up with Dr. Ave Filter as instructed -Follow up with PCP in 2-3 weeks (Needs CBC to follow Hgb)  Discharge Diagnoses:  Intertrochanteric fracture of right femur Hypokalemia Acute blood loss anemia Hypophosphatemia Constipaiton  Discharge Condition: stable and w/o significant hip pain at discharge. Patient will be discharge home with [facility] (PT) services and has been advised to follow discharge instructions and medications as prescribed.  Diet recommendation: regular diet  Filed Weights   05/15/12 0030  Weight: 58.968 kg (130 lb)    History of present illness:  51 y/o female with mild osteoarthritis who is fairly active presented to ED after tripping over her dg at home and sustained a fall. In the ED she had x ray of her hip showing right proximal intertrochanteric fracture with valgus deformity. Patient given IV dilaudid and her pain is well controlled at present. She denies any chest pain, palpitations, SOB, fever, chills, dizziness, nausea, vomiting bowel or urinary symptoms. Labs unremarkable.   Hospital Course:  1-Right hip fracture: S/P intramedullary nail POD #2; pain is well controlled and patient is eager to start moving. Will follow with ortho as an outpatient and will follow discharge instructions for DVT prophylaxis, wound care and activity. Continue Vit and calcium BID. PRN Norco for severe pain.   2-ABLA: Hgb 10.1 at discharge; down from 12.7. No transfusion needed. Will discharge on iron pills and follow Hgb during follow up visit.   3-Hypokalemia: repleted. Mg WNL.   4-Hypophosphatemia: repleted as  well.   5-Constipation: 2/2 pain meds. Will discharge on colace. BM's achieved before discharge.   Procedures:  Right intramedullary nail   Consultations:  Ortho (Dr. Ave Filter)  PT  Discharge Exam: Filed Vitals:   05/16/12 1600 05/16/12 2127 05/17/12 0449 05/17/12 0800  BP:  121/77 119/63   Pulse:  88 76   Temp:  98.3 F (36.8 C) 98.8 F (37.1 C)   TempSrc:  Oral Oral   Resp: 16 16 16 16   Height:      Weight:      SpO2: 94% 96% 99% 94%   General: NAD, afebrile, cooperative to examination  Cardiovascular: S1 and S2, no rubs, no murmurs, no gallops  Respiratory: CTA bilaterally  Abdomen: soft, NT, ND, positive BS  Extremities: no edema, no cyanosis; mild pain with movement on right hip (but well controlled); patient eager to move and start PT  Neuro: non focal.  Discharge Instructions  Discharge Orders    Future Orders Please Complete By Expires   Weight bearing as tolerated      Increase activity slowly      Discharge instructions      Comments:   -Weight bearing as tolerated -Follow Physical therapy instructions and recommendations -Take medications as prescribed -Follow up with Dr. Ave Filter as instructed -Follow up with PCP in 2-3 weeks       Medication List     As of 05/17/2012 10:57 AM    TAKE these medications         aspirin 325 MG EC tablet   Take 1 tablet (325 mg total) by mouth 2 (two) times daily.      calcium-vitamin D 500-200 MG-UNIT per tablet   Commonly known as:  OSCAL WITH D   Take 2 tablets by mouth 2 (two) times daily.      DSS 100 MG Caps   Take 100 mg by mouth 2 (two) times daily. Hold for diarrhea      ferrous fumarate 325 (106 FE) MG Tabs   Commonly known as: HEMOCYTE - 106 mg FE   Take 1 tablet (106 mg of iron total) by mouth 2 (two) times daily.      Glucosamine 500 MG Caps   Take 2 capsules by mouth daily.      HYDROcodone-acetaminophen 5-325 MG per tablet   Commonly known as: NORCO/VICODIN   Take 1-2 tablets by mouth  every 4 (four) hours as needed.      ibuprofen 200 MG tablet   Commonly known as: ADVIL,MOTRIN   Take 400 mg by mouth every 6 (six) hours as needed. pain      omeprazole 40 MG capsule   Commonly known as: PRILOSEC   Take 1 capsule (40 mg total) by mouth daily.           Follow-up Information    Follow up with Mable Paris, MD. In 2 weeks.   Contact information:   [address] [place] [address] [phone]           The results of significant diagnostics from this hospitalization (including imaging, microbiology, ancillary and laboratory) are listed below for reference.    Significant Diagnostic Studies: Dg Femur Right  05/15/2012  *RADIOLOGY REPORT*  Clinical Data: Fixation of proximal femur fracture.  RIGHT FEMUR - 2 VIEW  Comparison: 05/14/2012  Findings: 4 intraoperative views.  These demonstrate placement of intermedullary rod and screw fixation device across the previously described intertrochanteric femur fracture.  No acute hardware complication identified.  IMPRESSION: Intraoperative imaging of proximal femoral fixation.   Original Report Authenticated By: Jeronimo Greaves, M.D.    Dg Femur Right  05/14/2012  *RADIOLOGY REPORT*  Clinical Data: Right femoral pain after fall.  RIGHT FEMUR - 2 VIEW  Comparison: None.  Findings: There is an impacted intertrochanteric fracture of the right hip with valgus angulation of the fracture fragments. The distal femur appears intact.  No focal bone lesion or bone destruction.  IMPRESSION: Intertrochanteric fracture of the right proximal femur with valgus orientation.   Original Report Authenticated By: Burman Nieves, M.D.    Dg Chest Port 1 View  05/14/2012  *RADIOLOGY REPORT*  Clinical Data: Preop.  Nonsmoker.  PORTABLE CHEST - 1 VIEW  Comparison: None.  Findings: The heart size and pulmonary vascularity are normal. The lungs appear clear and expanded without focal air space disease or consolidation. No  blunting of the costophrenic angles.  No pneumothorax.  Mediastinal contours appear intact.  IMPRESSION: No evidence of active pulmonary disease.   Original Report Authenticated By: Burman Nieves, M.D.    Dg Femur Right Port  05/15/2012  *RADIOLOGY REPORT*  Clinical Data: Status post fracture fixation.  PORTABLE RIGHT FEMUR - 2 VIEW  Comparison: Plain films 05/14/2012.  Findings: The patient has a new dynamic hip screw and long IM nail with a single distal interlocking screw for fixation of an intertrochanteric fracture.  Hardware is intact.  No acute fracture is identified.  Position and alignment of the intertrochanteric fracture are near anatomic.  Gas in the soft tissues from surgery noted.  IMPRESSION: ORIF right intertrochanteric fracture without evidence of complication.   Original Report Authenticated By: Holley Dexter, M.D.    Dg C-arm 1-60 Min-no Report  05/15/2012  CLINICAL DATA: right hip fracture   C-ARM 1-60 MINUTES  Fluoroscopy was utilized by the requesting physician.  No radiographic  interpretation.      Microbiology: Recent Results (from the past 240 hour(s))  URINE CULTURE     Status: Normal (Preliminary result)   Collection Time   05/14/12 10:12 PM      Component Value Range Status Comment   Specimen Description URINE, CLEAN CATCH   Final    Special Requests NONE   Final    Culture  Setup Time 05/15/2012 14:03   Final    Colony Count 10,000 COLONIES/ML   Final    Culture ESCHERICHIA COLI   Final    Report Status PENDING   Incomplete   SURGICAL PCR SCREEN     Status: Abnormal   Collection Time   05/15/12  8:40 AM      Component Value Range Status Comment   MRSA, PCR INVALID RESULTS, SPECIMEN SENT FOR CULTURE (*) NEGATIVE Final    Staphylococcus aureus INVALID RESULTS, SPECIMEN SENT FOR CULTURE (*) NEGATIVE Final   MRSA CULTURE     Status: Normal (Preliminary result)   Collection Time   05/15/12  8:40 AM      Component Value Range Status Comment   Specimen  Description NOSE   Final    Special Requests NONE   Final    Culture NO SUSPICIOUS COLONIES, CONTINUING TO HOLD   Final    Report Status PENDING   Incomplete      Labs: Basic Metabolic Panel:  Lab 05/16/12 1478 05/14/12 2255  NA 138 138  K 4.0 3.2*  CL 106 102  CO2 25 24  GLUCOSE 150* 129*  BUN 11 21  CREATININE 0.56 0.69  CALCIUM 8.4 9.7  MG 1.9 --  PHOS 2.0* --   Liver Function Tests:  Lab 05/16/12 0418  AST 22  ALT 9  ALKPHOS 38*  BILITOT 0.3  PROT 5.3*  ALBUMIN 2.9*   CBC:  Lab 05/17/12 0410 05/16/12 0418 05/14/12 2255  WBC 7.4 9.6 10.0  NEUTROABS -- -- 8.3*  HGB 10.1* 9.7* 12.7  HCT 30.2* 28.4* 36.5  MCV 93.5 92.5 91.5  PLT 203 194 241    Signed:  Rolena Knutson  [facility] 05/17/2012, 10:57 AM

## 2012-05-19 LAB — VITAMIN D 1,25 DIHYDROXY: Vitamin D 1, 25 (OH)2 Total: 60 pg/mL (ref 18–72)

## 2012-05-25 ENCOUNTER — Ambulatory Visit: Payer: BC Managed Care – PPO

## 2012-05-30 ENCOUNTER — Encounter (HOSPITAL_BASED_OUTPATIENT_CLINIC_OR_DEPARTMENT_OTHER)
Admission: RE | Disposition: A | Discharge status: Home / Self Care | Payer: Self-pay | Source: Ambulatory Visit | Attending: Gynecologic Oncology

## 2012-05-30 ENCOUNTER — Encounter (HOSPITAL_BASED_OUTPATIENT_CLINIC_OR_DEPARTMENT_OTHER): Payer: Self-pay

## 2012-05-30 ENCOUNTER — Ambulatory Visit
Admission: RE | Admit: 2012-05-30 | Discharge: 2012-05-30 | Disposition: A | Discharge status: Home / Self Care | Payer: BC Managed Care – PPO | Source: Ambulatory Visit | Attending: Gynecologic Oncology | Admitting: Gynecologic Oncology

## 2012-05-30 ENCOUNTER — Encounter (HOSPITAL_BASED_OUTPATIENT_CLINIC_OR_DEPARTMENT_OTHER): Payer: Self-pay | Admitting: Specialist

## 2012-05-30 ENCOUNTER — Ambulatory Visit (HOSPITAL_BASED_OUTPATIENT_CLINIC_OR_DEPARTMENT_OTHER): Payer: BC Managed Care – PPO | Admitting: Specialist

## 2012-05-30 ENCOUNTER — Ambulatory Visit (HOSPITAL_BASED_OUTPATIENT_CLINIC_OR_DEPARTMENT_OTHER): Payer: BC Managed Care – PPO | Admitting: Gynecologic Oncology

## 2012-05-30 DIAGNOSIS — T81329A Deep disruption or dehiscence of operation wound, unspecified, initial encounter: Secondary | ICD-10-CM | POA: Insufficient documentation

## 2012-05-30 DIAGNOSIS — T8132XA Disruption of internal operation (surgical) wound, not elsewhere classified, initial encounter: Secondary | ICD-10-CM | POA: Insufficient documentation

## 2012-05-30 DIAGNOSIS — K5010 Crohn's disease of large intestine without complications: Secondary | ICD-10-CM | POA: Insufficient documentation

## 2012-05-30 DIAGNOSIS — H409 Unspecified glaucoma: Secondary | ICD-10-CM | POA: Insufficient documentation

## 2012-05-30 DIAGNOSIS — J309 Allergic rhinitis, unspecified: Secondary | ICD-10-CM | POA: Insufficient documentation

## 2012-05-30 DIAGNOSIS — Z86718 Personal history of other venous thrombosis and embolism: Secondary | ICD-10-CM | POA: Insufficient documentation

## 2012-05-30 DIAGNOSIS — Y836 Removal of other organ (partial) (total) as the cause of abnormal reaction of the patient, or of later complication, without mention of misadventure at the time of the procedure: Secondary | ICD-10-CM | POA: Insufficient documentation

## 2012-05-30 DIAGNOSIS — I10 Essential (primary) hypertension: Secondary | ICD-10-CM | POA: Insufficient documentation

## 2012-05-30 SURGERY — HYSTERECTOMY, VAGINAL
Anesthesia: Anesthesia General | Site: Pelvis | Wound class: Clean Contaminated

## 2012-05-30 MED ORDER — LACTATED RINGERS IV SOLN
INTRAVENOUS | Status: DC
Start: 2012-05-30 — End: 2012-05-30

## 2012-05-30 MED ORDER — ONDANSETRON HCL 4 MG/2ML IJ SOLN
4.0000 mg | Freq: Once | INTRAMUSCULAR | Status: DC | PRN
Start: 2012-05-30 — End: 2012-05-30

## 2012-05-30 MED ORDER — FENTANYL CITRATE 0.05 MG/ML IJ SOLN
INTRAMUSCULAR | Status: AC
Start: 2012-05-30 — End: ?
  Filled 2012-05-30: qty 2

## 2012-05-30 MED ORDER — MEPERIDINE HCL 25 MG/ML IJ SOLN
25.0000 mg | INTRAMUSCULAR | Status: DC | PRN
Start: 2012-05-30 — End: 2012-05-30

## 2012-05-30 MED ORDER — FENTANYL CITRATE 0.05 MG/ML IJ SOLN
25.0000 ug | INTRAMUSCULAR | Status: DC | PRN
Start: 2012-05-30 — End: 2012-05-30

## 2012-05-30 MED ORDER — OXYCODONE-ACETAMINOPHEN 5-325 MG PO TABS
1.0000 | ORAL_TABLET | Freq: Once | ORAL | Status: DC | PRN
Start: 2012-05-30 — End: 2012-05-30

## 2012-05-30 MED ORDER — LIDOCAINE HCL 2 % IJ SOLN
INTRAMUSCULAR | Status: DC | PRN
Start: 2012-05-30 — End: 2012-05-30
  Administered 2012-05-30: 100 mg

## 2012-05-30 MED ORDER — DIPHENHYDRAMINE HCL 50 MG/ML IJ SOLN
6.2500 mg | Freq: Four times a day (QID) | INTRAMUSCULAR | Status: DC | PRN
Start: 2012-05-30 — End: 2012-05-30

## 2012-05-30 MED ORDER — FENTANYL CITRATE 0.05 MG/ML IJ SOLN
INTRAMUSCULAR | Status: DC | PRN
Start: 2012-05-30 — End: 2012-05-30
  Administered 2012-05-30 (×2): 50 ug via INTRAVENOUS

## 2012-05-30 MED ORDER — HYDROMORPHONE HCL PF 1 MG/ML IJ SOLN
0.5000 mg | INTRAMUSCULAR | Status: DC | PRN
Start: 2012-05-30 — End: 2012-05-30

## 2012-05-30 MED ORDER — OXYCODONE-ACETAMINOPHEN 5-325 MG PO TABS
ORAL_TABLET | ORAL | Status: AC
Start: 2012-05-30 — End: ?
  Filled 2012-05-30: qty 1

## 2012-05-30 MED ORDER — CEFAZOLIN SODIUM 1 G IJ SOLR
1.00 g | INTRAMUSCULAR | Status: DC
Start: 2012-05-30 — End: 2012-05-30

## 2012-05-30 MED ORDER — PROPOFOL INFUSION 10 MG/ML
INTRAVENOUS | Status: DC | PRN
Start: 2012-05-30 — End: 2012-05-30
  Administered 2012-05-30: 200 mg via INTRAVENOUS

## 2012-05-30 MED ORDER — FENTANYL CITRATE 0.05 MG/ML IJ SOLN
25.0000 ug | INTRAMUSCULAR | Status: AC | PRN
Start: 2012-05-30 — End: 2012-05-30
  Administered 2012-05-30 (×4): 25 ug via INTRAVENOUS

## 2012-05-30 MED ORDER — CEFAZOLIN SODIUM 1 G IJ SOLR
INTRAMUSCULAR | Status: DC | PRN
Start: 2012-05-30 — End: 2012-05-30
  Administered 2012-05-30: 1 g via INTRAVENOUS

## 2012-05-30 MED ORDER — CEFAZOLIN SODIUM 1 G IJ SOLR
INTRAMUSCULAR | Status: DC
Start: 2012-05-30 — End: 2012-05-30
  Filled 2012-05-30: qty 1000

## 2012-05-30 MED ORDER — AMOXICILLIN-POT CLAVULANATE 500-125 MG PO TABS
1.00 | ORAL_TABLET | Freq: Three times a day (TID) | ORAL | Status: AC
Start: 2012-05-30 — End: 2012-06-09

## 2012-05-30 MED ORDER — LACTATED RINGERS IV SOLN
INTRAVENOUS | Status: DC | PRN
Start: 2012-05-30 — End: 2012-05-30

## 2012-05-30 MED ORDER — METOCLOPRAMIDE HCL 5 MG/ML IJ SOLN
10.0000 mg | Freq: Once | INTRAMUSCULAR | Status: DC | PRN
Start: 2012-05-30 — End: 2012-05-30

## 2012-05-30 MED ORDER — KETOROLAC TROMETHAMINE 30 MG/ML IJ SOLN
30.0000 mg | Freq: Once | INTRAMUSCULAR | Status: DC | PRN
Start: 2012-05-30 — End: 2012-05-30

## 2012-05-30 MED ORDER — KETOROLAC TROMETHAMINE 30 MG/ML IJ SOLN
30.0000 mg | Freq: Once | INTRAMUSCULAR | Status: AC
Start: 2012-05-30 — End: 2012-05-30
  Administered 2012-05-30: 30 mg via INTRAVENOUS

## 2012-05-30 MED ORDER — KETOROLAC TROMETHAMINE 30 MG/ML IJ SOLN
INTRAMUSCULAR | Status: AC
Start: 2012-05-30 — End: ?
  Filled 2012-05-30: qty 1

## 2012-05-30 MED ORDER — ENOXAPARIN SODIUM 40 MG/0.4ML SC SOLN
40.00 mg | SUBCUTANEOUS | Status: DC
Start: 2012-05-30 — End: 2012-05-30

## 2012-05-30 MED ORDER — OXYCODONE-ACETAMINOPHEN 5-325 MG PO TABS
1.0000 | ORAL_TABLET | Freq: Once | ORAL | Status: AC | PRN
Start: 2012-05-30 — End: 2012-05-30
  Administered 2012-05-30: 1 via ORAL

## 2012-05-30 MED ORDER — ENOXAPARIN SODIUM 40 MG/0.4ML SC SOLN
SUBCUTANEOUS | Status: AC
Start: 2012-05-30 — End: 2012-05-30
  Administered 2012-05-30: 40 mg via SUBCUTANEOUS
  Filled 2012-05-30: qty 0.4

## 2012-05-30 MED ORDER — PROMETHAZINE HCL 25 MG/ML IJ SOLN
6.2500 mg | Freq: Once | INTRAMUSCULAR | Status: DC | PRN
Start: 2012-05-30 — End: 2012-05-30

## 2012-05-30 SURGICAL SUPPLY — 4 items
PAD SANITARY L12.25 IN X W4.25 IN HEAVY ABSORBENT MOISTURE BARRIER (Dressing) IMPLANT
PAD SNTR SLK FLF CRTY 12.25X4.25IN LF NS (Dressing) ×2
SUTURE ×1 IMPLANT
TRAY CATHETER FOLEY 16F (Tray) ×1 IMPLANT

## 2012-05-30 NOTE — Anesthesia Preprocedure Evaluation (Signed)
Anesthesia Evaluation    AIRWAY    Mallampati: II    TM distance: <3 FB  Neck ROM: full  Mouth Opening:full   CARDIOVASCULAR    cardiovascular exam normal     DENTAL    No notable dental hx     PULMONARY    pulmonary exam normal     OTHER FINDINGS              Anesthesia Plan    ASA 3   general   Detailed anesthesia plan: general endotracheal and general LMA      Post op pain management: per surgeon  intravenous induction   informed consent obtained

## 2012-05-30 NOTE — Anesthesia Postprocedure Evaluation (Signed)
Anesthesia Post Evaluation    Patient: [name]    Procedures performed: Procedure(s) with comments:  HYSTERECTOMY, VAGINAL - procedure was a vaginal cuff repair    Anesthesia type: General LMA    Patient location:Phase I PACU    Last vitals:   Filed Vitals:    05/30/12 1357   BP: 138/86   Pulse: 60   Resp: 18   SpO2: 95%       Post pain: Patient not complaining of pain, continue current therapy     Mental Status:awake    Respiratory Function: tolerating room air    Cardiovascular: stable    Nausea/Vomiting: patient not complaining of nausea or vomiting    Hydration Status: adequate    Post assessment: no apparent anesthetic complications

## 2012-05-30 NOTE — Brief Op Note (Signed)
BRIEF OP NOTE    Date Time: 05/30/2012 6:23 PM    Patient Name:   [name]    Date of Operation:   05/30/2012    Providers Performing:   Surgeon(s):  Parke Poisson, MD  Janey Genta, PGY-2    Assistant (s):   Circulator  Graceann Congress - Scrub Person    Operative Procedure:   Procedure(s):  Repair of vaginal cuff    Preoperative Diagnosis:   Pre-Op Diagnosis Codes:     * Disruption of internal operation (surgical) wound [998.31]    Postoperative Diagnosis:   same    Anesthesia:   IV Sedation    Estimated Blood Loss:   0cc  IVF 1000cc  UOP 100cc    Implants:   * No implants in log *    Drains:   Drains: no    Specimens:       Findings:   Vaginal cuff without dehiscence upon initial observation.  After slight manipulation dehiscence of cuff noted and repaired without difficulty.  No vaginal lesions or abnormalities visualized    Complications:   none      Signed by: Anders Grant, MD  PGY-2                                                                         [facility] OR

## 2012-05-30 NOTE — H&P (Signed)
Patient is s/p TLH now with superficial vaginal separation that did not heal with conservative management here for repair. C/o occ spotting o/w no other complaints.    ROS- as above o/w 14 pt neg    PMH - HTN, colitis, h/o DVT  PSH - TLH  Meds - Xarelto, Prednisone, Remicaid  All - Latex, Cosopt    VSS  Gen - NAD  Heart - RRR  Lungs - CTAB  Pelvic - deferred to OR    A/p - vaginal cuff superficial separation for repair, Plan for OR today.

## 2012-05-30 NOTE — Discharge Instructions (Signed)
[facility]   [address], [address]   [place], [place] [address]   Phone: [phone]   Fax: [phone]   DISCHARGE INSTRUCTIONS:   -Return to the emergency room or call the clinic for temperature >100.4, difficulty urinating, purulent (foul smelling or green ) vaginal discharge, severe constipation, vomiting (unable to keep any food or liquids down for > 24 hours), difficulty eating, heavy vaginal bleeding (> 1 pad/hour x 2 hours), or signs of incisional infection (redness, drainage, bleeding of abdominal incisions).   -Nothing in the vagina (no sex, douching, tampons, swimming, hot tubs) for at least 6 weeks   -No driving for day of surgery or while taking narcotics (typically no driving for 2 weeks after major surgery)   -No exercise x 12 weeks, no lifting > 15 pounds x 12 weeks. Walking is ok.   -Wash ywith normal soap and water. Do not douche or put any other ointments or lotions on this area. Wash your abdominal incisions with soap and water. Do not put lotions or ointments on these areas.   -Limit stairs to once/day for one week after surgery.   -Do not drink alcohol while taking narcotics.   -It is normal to have vaginal bleeding after this surgery (like a light period or spotting) for up to two weeks. You may then have bleeding again at 6 weeks when the sutures dissolve. You can wear a panty liner during this period (unscented). As the sutures dissolve, it is normal to have a watery yellow discharge with a musty odor. It is NOT normal to soak a pad an hour with blood, or to have a very foul smelling discharge. If you experience this, you need to be evaluated immediately.   -You may put an ice pack on your perineal area three times a day (wrapped in a towel, not directly on skin) the first week after surgery to help alleviate pain and swelling.     MEDICATIONS: See separate Medication Reconciliation.   Additional over-the-counter medications:   It is important to take a  stool softener after surgery. You will be taking narcotics which constipate you. You do not want to be constipated and have to bear down a lot to have a bowel movement as this can both be uncomfortable and can put pressure on your repair. Therefore you should take a daily stool softener such as docusate or milk of magnesia to prevent constipation.   Tylenol, Motrin (Ibuprofen), other anti-inflammatory as needed    FOLLOW-UP:   Follow up with your physician as previously instructed. Call the clinic if you do not already have an appointment scheduled.

## 2012-05-30 NOTE — Transfer of Care (Signed)
Anesthesia Transfer of Care Note    Patient: [name]    Procedures performed: Procedure(s) with comments:  HYSTERECTOMY, VAGINAL - procedure was a vaginal cuff repair    Anesthesia type: General LMA    Patient location:Phase I PACU    Last vitals:   Filed Vitals:    05/30/12 1357   BP: 138/86   Pulse: 60   Resp: 18   SpO2: 95%       Post pain: Patient not complaining of pain, continue current therapy     Mental Status:awake    Respiratory Function: tolerating room air    Cardiovascular: stable    Nausea/Vomiting: patient not complaining of nausea or vomiting    Hydration Status: adequate    Post assessment: no apparent anesthetic complications

## 2012-05-31 NOTE — Op Note (Signed)
Procedure Date: 05/30/2012     Patient Type: A     SURGEON: Parke Poisson MD  ASSISTANT:       PREOPERATIVE DIAGNOSIS:  Superficial vaginal cuff separation.     POSTOPERATIVE DIAGNOSIS:  Complete vaginal cuff separation.     TITLES OF PROCEDURE:  Vaginal cuff repair.     ANESTHESIA:  LMA.     OPERATIVE FINDINGS:  Initial superficial vaginal cuff separation, with further probing opened up  completely with bowel that was protruding out.     DESCRIPTION OF PROCEDURE:  After noting appropriate consents and giving antibiotics for presuming  subclinical infection, the patient was taken to the operating room, placed  in dorsal lithotomy position.  SCDs were started prior to anesthesia  administration.  Exam under anesthesia was performed noting superficial  separation of the vagina.  She was then prepped and draped in the usual  sterile fashion.  A Foley catheter was inserted in a sterile manner.  A  sterile speculum was inserted in the vagina and the vagina was further  probed, the vaginal cuff angles were grasped.  The complete vaginal cuff  opened up and the bowel started protruding out.  As a result, sponge tick  was used to push the bowel in and with careful visualization, the vaginal  edges were freshened up and vaginal cuff was restitched with multiple  figure-of-eight sutures with 0 Vicryl suture in interrupted fashion.  Good  closure was obtained with wide cuff bites.  Subsequently, irrigation was  performed and everything was noted to be hemostatic.  At this time, all  counts were correct.  The patient was successfully extubated and brought  back to PACU for recovery.  I was present and scrubbed for the entire  procedure.           D:  05/30/2012 18:43 PM by Dr. Parke Poisson, MD ([id number])  T:  05/31/2012 08:19 AM by [id number]      Everlean Cherry: [id number]) (Doc ID: [id number])

## 2012-06-09 ENCOUNTER — Ambulatory Visit: Payer: BC Managed Care – PPO

## 2012-06-24 NOTE — Op Note (Unsigned)
DATE OF BIRTH:                        [birth year]      ADMISSION DATE:                     12/14/2003            PATIENT LOCATION:                     [id number]            DATE OF PROCEDURE:                   01/06/2004      SURGEON:                            Dwyane Luo Arminio, DPM      ASSISTANT(S):                         Irineo Axon, MD                  PREOPERATIVE DIAGNOSES      1.   HALLUX VALGUS DEFORMITY, LEFT FOOT.      2.   TAILOR'S BUNION, LEFT FOOT.      3.   EXOSTOSIS, FOURTH DIGIT, LEFT FOOT.      4.   HAMMER DIGIT SYNDROME, FIFTH TOE, LEFT FOOT.            POSTOPERATIVE DIAGNOSES      1.   HALLUX VALGUS DEFORMITY, LEFT FOOT.      2.   TAILOR'S BUNION, LEFT FOOT.      3.   EXOSTOSIS, FOURTH DIGIT, LEFT FOOT.      4.   HAMMER DIGIT SYNDROME, FIFTH TOE, LEFT FOOT.            PROCEDURES      1.   AUSTIN-AKIN BUNIONECTOMY, LEFT FOOT.      2.   TAILOR'S BUNION CORRECTION, LEFT FOOT.      3.   EXOSTECTOMY, FOURTH DIGIT, LEFT FOOT.      4.   PROXIMAL INTERPHALANGEAL JOINT ARTHROPLASTY, FIFTH DIGIT, LEFT FOOT.            ANESTHESIA:  IV sedation and local infiltrative block.            HEMOSTASIS:  Ankle pneumatic tourniquet.            ESTIMATED BLOOD LOSS:  Minimal.            ANTIBIOTIC GIVEN:  Ancef 1 gm preoperatively.            DESCRIPTION OF PROCEDURE:  The patient was brought to the operating room      and placed on the operating table in the supine position.  Following IV      sedation local anesthesia was administered about the operative site in      local infiltrative block fashion utilizing a total of approximately 15 mL      of a 1:1 dilution of 1% lidocaine and 0.5% Marcaine plain.  A left ankle      pneumatic tourniquet was placed in the supramalleolar position.  The left      foot was prepped and draped in the usual sterile manner.            Following  exsanguination with an Esmarch the left ankle tourniquet was      inflated to 250 mmHg.  The following procedure began.            Attention  was then directed to the dorsal aspect of the first      metatarsophalangeal joint on the left foot where an approximately 7-cm      linear incision was made medial to and following the contour of the      extensor hallucis longus extending onto the base of the proximal phalanx of      the left hallux.            At this time the incision was deepened to the subcutaneous tissue with      sharp and blunt dissection.  Hemostasis was achieved using electrocautery.      The cutaneous and subcutaneous tissues were retracted along with the      superficial muscles and nerves exposing the dorsal aspect of the first      metatarsophalangeal joint to the operative site.  Next in line with the      original skin incision a dorsal capsulotomy was performed.  The capsule and      periosteum were reflected from the upper osseous attachments exposing the      head of the first metatarsal as well as the base of the proximal phalanx of      the left hallux to the operative site.            Once this was done a 0.05-inch K-wire was loaded up onto a wire driver.      There was noted to be about a 10% dorsal central osteochondral defect at      the head of the metatarsal.  A 0.05-inch K-wire was then used to fenestrate      the defect to promote fibrous ingrowth secondary to repair of the defect at      the cartilaginous surface.            Next a sagittal bone saw was used to remove the dorsomedial bone.  A 0.045      K-wire was then driven from mediolateral starting with a temporary guide      wire.  The sagittal bone saw was used to make a [employer] osteotomy.  The      osteotomy had its apex pointing distal and its arms pointing      proximal-dorsal and proximal-plantar with the dorsal arm long to      accommodate internal fixation once the cut was made and the capital      fragment was shifted laterally onto the shaft of the metatarsal, closing      down the intermetatarsal line between the first and second metatarsal of      the  left foot.            The K-wire was removed and the head was impacted onto the shaft of the      metatarsal.  Using standard AO fixation principles and techniques, a single      2.7-mm cortical bone screw x 14 mm in length, and a single 2-mm cortical      bone screw x 16 mm in length was placed from dorsal to plantar across the      osteotomy site with excellent fixation noted.            Once removed, a sagittal bone saw was used to  remove the remaining medial      bone shelf.  Attention was then directed to the base of the proximal      phalanx where a wedge was removed in the transverse plane with its apex      pointing proximal-lateral and its base pointing distal-medial.  The wedge      was completely removed and the bone was then closed down on itself to      create a good fusion site using standard AO fixation principles and      techniques.            A single 2-mm cortical bone screw x 15 mm in length was placed      proximal-medial to dorsal-lateral across the osteotomy fusion site with      excellent fixation and compression noted.  Then the foot was loaded.  There      was noted to be excellent correction of the hallux valgus deformity.            Next attention was directed to the dorsal aspect of the fifth      metatarsophalangeal joint where approximately a 3-cm linear incision was      made over the joint.  The incision was carried through the subcutaneous      tissue using sharp and blunt dissection.  Hemostasis was achieved using      electrocautery.  The cutaneous and subcutaneous tissues were retracted      along the superficial vessels and nerves exposing the dorsal aspect of the      fifth metatarsophalangeal joint and the fifth metatarsal to the operative      site.  A periosteal incision and capsular incision was made.  The capsule      and periosteum were reflected off the osseous attachments, exposing the      head of the fifth metatarsal to the operative site.  A sagittal bone saw       was used to remove the dorsolateral eminence.            A sagittal bone saw was then used to make a long oblique dorsal osteotomy      distal-dorsal to plantar-proximal, leaving the plantar proximal medial      hinge intact.  Once this cut was made the head was then shifted medially to      close down the intermetatarsal between the fifth and fourth metatarsals,      removing the tailor's bunion deformity.            This was done using standard AO fixation principles and techniques.  Then      two 2-mm cortical bone screws 12 mm in length were placed from dorsal to      plantar across the osteotomy site with excellent fixation and compression      noted.  Once this was done a bone rongeur was used to remove the remaining      lateral bone shelf.            Next a 1-cm stab incision was made at the lateral aspect of the base of the      proximal phalanx of the fourth digit where the incision was carried through      the subcutaneous tissue with sharp and blunt dissection.  Hemostasis was      achieved using electrocautery.  The cutaneous and subcutaneous tissues were      checked along with the superficial  muscles and nerves exposing the dorsal      aspect of the lateral aspect of the base of the fourth proximal phalanx.            A sagittal bone saw was then used to remove the lateral aspect of the base      where it was prominent.  It was removed from the operative site in toto.            Next a dorsal incision was made over the proximal interphalangeal joint of      the fifth digit, left foot, where the incision again was carried through      the subcutaneous tissue using sharp and blunt dissection.  Hemostasis was      achieved using electrocautery.  The cutaneous and subcutaneous tissues were      retracted along with superficial nerves, exposing the dorsal aspect of the      long extensor tendon to the operative site.            A dorsal tenotomy was performed and the capsule and periosteum were       reflected off the osseus attachments, exposing the head of the proximal      phalanx of the fifth digit to the operative site.            Once this was done a sagittal saw was used to remove the head of the      proximal phalanx.  The toe was then noted to ride in a more rectus      position.            Following irrigation with copious amounts of sterile saline, the structures      were replaced and repaired in anatomical fashion.  The deep fascia and      periosteum of the tailor's bunion and bunion incisions were repaired using      3-0 Vicryl in a running interrupted fashion.  The subcutaneous tissue was      reapproximated utilizing 4-0 Vicryl in interrupted block-stitch fashion.      The skin was reapproximated using 5-0 Monocryl in a running subcuticular      fashion.  The skin of the fourth and fifth digit incisions was      reapproximated using 5-0 nylon in interrupted horizontal mattress fashion.            The site was injected with postoperative injections of approximately 10 mL      0.5% Marcaine plain.  The wound was dressed with Betadine-soaked Adaptic,      sterile 4 x 4's, 4-inch Kling, and a compression bandage to prevent      postoperative hematoma.  After dressing the area, the pneumatic tourniquet      was released with immediate vascular return noted to digits one through      five of the left foot.  The patient tolerated the procedure and anesthesia      well with vital signs stable throughout.  The patient was transferred from      the operating room to the recovery room with vital signs stable and      vascular status intact to the left foot.  A posterior splint was placed on      the left foot.  The patient was provided with crutches and instructed to      remain nonweightbearing on the left lower extremity and to follow the      protocol  of rest, ice, and elevation.  The patient will be discharged from      the recovery room with all postoperative instructions when all criteria      have  been met per anesthesia. The patient will follow up for the entire      postoperative period in the office of Dr. Lina Sayre.                                                ___________________________________          Date Signed: __________      Pablo Lawrence, DPM  [id number])            D: 01/06/2004 by Irineo Axon, MD      T: 01/07/2004 by [id number] ([id number]) ([id number])      cc:  Pablo Lawrence, DPM

## 2012-06-28 MED ORDER — DIPHENHYDRAMINE HCL 50 MG/ML IJ SOLN
50.0000 mg | Freq: Once | INTRAMUSCULAR | Status: DC
Start: 2012-06-29 — End: 2012-06-30
  Filled 2012-06-28: qty 1

## 2012-06-28 MED ORDER — DIPHENHYDRAMINE HCL 25 MG PO CAPS
50.00 mg | ORAL_CAPSULE | Freq: Once | ORAL | Status: DC
Start: 2012-06-29 — End: 2012-06-30

## 2012-06-28 MED ORDER — ACETAMINOPHEN 325 MG PO TABS
650.0000 mg | ORAL_TABLET | Freq: Once | ORAL | Status: AC
Start: 2012-06-29 — End: 2012-06-29
  Administered 2012-06-29: 650 mg via ORAL
  Filled 2012-06-28: qty 2

## 2012-06-28 MED ORDER — SODIUM CHLORIDE 0.9 % IV SOLN
300.0000 mg | Freq: Once | INTRAVENOUS | Status: AC
Start: 2012-06-29 — End: 2012-06-29
  Administered 2012-06-29: 300 mg via INTRAVENOUS
  Filled 2012-06-28: qty 300

## 2012-06-29 ENCOUNTER — Ambulatory Visit
Admission: RE | Admit: 2012-06-29 | Discharge: 2012-06-29 | Disposition: A | Discharge status: Home / Self Care | Payer: BC Managed Care – PPO | Source: Ambulatory Visit | Attending: Gastroenterology | Admitting: Gastroenterology

## 2012-06-29 VITALS — BP 114/78 | HR 62 | Temp 97.5°F | Resp 16 | Ht 63.0 in | Wt 144.0 lb

## 2012-06-29 DIAGNOSIS — K5289 Other specified noninfective gastroenteritis and colitis: Secondary | ICD-10-CM | POA: Insufficient documentation

## 2012-06-29 MED ORDER — DIPHENHYDRAMINE HCL 25 MG PO CAPS
25.0000 mg | ORAL_CAPSULE | Freq: Once | ORAL | Status: AC
Start: 2012-06-29 — End: 2012-06-29

## 2012-06-29 MED ORDER — METHYLPREDNISOLONE SODIUM SUCC 40 MG IJ SOLR
25.0000 mg | Freq: Once | INTRAMUSCULAR | Status: DC | PRN
Start: 2012-06-29 — End: 2012-06-30

## 2012-06-29 MED ORDER — DIPHENHYDRAMINE HCL 50 MG/ML IJ SOLN
50.0000 mg | Freq: Once | INTRAMUSCULAR | Status: DC | PRN
Start: 2012-06-29 — End: 2012-06-30

## 2012-06-29 MED ORDER — DIPHENHYDRAMINE HCL 50 MG/ML IJ SOLN
25.0000 mg | Freq: Once | INTRAMUSCULAR | Status: AC
Start: 2012-06-29 — End: 2012-06-29
  Administered 2012-06-29: 25 mg via INTRAVENOUS

## 2012-06-29 MED ORDER — ACETAMINOPHEN 325 MG PO TABS
650.0000 mg | ORAL_TABLET | Freq: Once | ORAL | Status: DC
Start: 2012-06-29 — End: 2012-06-30

## 2012-06-29 NOTE — Discharge Instructions (Signed)
Infliximab Solution for injection  What is this medicine?  INFLIXIMAB (in FLIX i mab) is used to treat Crohn's disease and ulcerative colitis. It is also used to treat ankylosing spondylitis, psoriasis, and some forms of arthritis.  This medicine may be used for other purposes; ask your health care provider or pharmacist if you have questions.  What should I tell my health care provider before I take this medicine?  They need to know if you have any of these conditions:   diabetes   exposure to tuberculosis   heart failure   hepatitis or liver disease   immune system problems   infection   lung or breathing disease, like COPD   multiple sclerosis   current or past resident of [place]   seizure disorder   an unusual or allergic reaction to infliximab, mouse proteins, other medicines, foods, dyes, or preservatives   pregnant or trying to get pregnant   breast-feeding  How should I use this medicine?  This medicine is for injection into a vein. It is usually given by a health care professional in a hospital or clinic setting.  A special MedGuide will be given to you by the pharmacist with each prescription and refill. Be sure to read this information carefully each time.  Talk to your pediatrician regarding the use of this medicine in children. Special care may be needed.  Overdosage: If you think you have taken too much of this medicine contact a poison control center or emergency room at once.  NOTE: This medicine is only for you. Do not share this medicine with others.  What if I miss a dose?  It is important not to miss your dose. Call your doctor or health care professional if you are unable to keep an appointment.  What may interact with this medicine?  Do not take this medicine with any of the following medications:   anakinra   rilonacept  This medicine may also interact with the following medications:   vaccines  This list may not describe all possible interactions.  Give your health care provider a list of all the medicines, herbs, non-prescription drugs, or dietary supplements you use. Also tell them if you smoke, drink alcohol, or use illegal drugs. Some items may interact with your medicine.  What should I watch for while using this medicine?  Visit your doctor or health care professional for regular checks on your progress.  If you get a cold or other infection while receiving this medicine, call your doctor or health care professional. Do not treat yourself. This medicine may decrease your body's ability to fight infections. Before beginning therapy, your doctor may do a test to see if you have been exposed to tuberculosis.  This medicine may make the symptoms of heart failure worse in some patients. If you notice symptoms such as increased shortness of breath or swelling of the ankles or legs, contact your health care provider right away.  If you are going to have surgery or dental work, tell your health care professional or dentist that you have received this medicine.  If you take this medicine for plaque psoriasis, stay out of the sun. If you cannot avoid being in the sun, wear protective clothing and use sunscreen. Do not use sun lamps or tanning beds/booths.  What side effects may I notice from receiving this medicine?  Side effects that you should report to your doctor or health care professional as soon as possible:     allergic reactions like skin rash, itching or hives, swelling of the face, lips, or tongue   chest pain   fever or chills, usually related to the infusion   muscle or joint pain   red, scaly patches or raised bumps on the skin   signs of infection - fever or chills, cough, sore throat, pain or difficulty passing urine   swollen lymph nodes in the neck, underarm, or groin areas   unexplained weight loss   unusual bleeding or bruising   unusually weak or tired   yellowing of the eyes or skin  Side effects that usually do not require medical  attention (report to your doctor or health care professional if they continue or are bothersome):   headache   heartburn or stomach pain   nausea, vomiting  This list may not describe all possible side effects. Call your doctor for medical advice about side effects. You may report side effects to FDA at 1-800-FDA-1088.  Where should I keep my medicine?  This drug is given in a hospital or clinic and will not be stored at home.  NOTE:This sheet is a summary. It may not cover all possible information. If you have questions about this medicine, talk to your doctor, pharmacist, or health care provider. Copyright 2013 [employer] Standard

## 2012-06-29 NOTE — Progress Notes (Signed)
1047 Arrived ambulatory to [facility] for a remicade infusion.  No complaints.  Premeds given.  1134 Remicade was started at 10 mL /hour x15 minutes,  then 20 mL/hr x15 minutes,  then 40 mL/hr for 15 minutes,  then 80 mL/hr for 15 min,  then 150 mL/hr for 30 min and  then 250 mL/hr till infusion completed.  1339 Remicade infused without problem.  Patient states she will call MD for problems.   1347 Patient left in NAD, ambulatory.

## 2012-08-16 MED ORDER — DIPHENHYDRAMINE HCL 25 MG PO CAPS
25.0000 mg | ORAL_CAPSULE | Freq: Once | ORAL | Status: AC
Start: 2012-08-17 — End: 2012-08-17

## 2012-08-16 MED ORDER — DIPHENHYDRAMINE HCL 50 MG/ML IJ SOLN
50.00 mg | Freq: Once | INTRAMUSCULAR | Status: DC
Start: 2012-08-16 — End: 2012-08-16

## 2012-08-16 MED ORDER — ACETAMINOPHEN 325 MG PO TABS
650.00 mg | ORAL_TABLET | Freq: Once | ORAL | Status: DC
Start: 2012-08-16 — End: 2012-08-16

## 2012-08-16 MED ORDER — DIPHENHYDRAMINE HCL 25 MG PO CAPS
50.0000 mg | ORAL_CAPSULE | Freq: Once | ORAL | Status: DC
Start: 2012-08-17 — End: 2012-08-18

## 2012-08-16 MED ORDER — METHYLPREDNISOLONE SODIUM SUCC 40 MG IJ SOLR
25.0000 mg | Freq: Once | INTRAMUSCULAR | Status: DC | PRN
Start: 2012-08-16 — End: 2012-08-16

## 2012-08-16 MED ORDER — DIPHENHYDRAMINE HCL 25 MG PO CAPS
25.0000 mg | ORAL_CAPSULE | Freq: Once | ORAL | Status: DC
Start: 2012-08-16 — End: 2012-08-16

## 2012-08-16 MED ORDER — DIPHENHYDRAMINE HCL 50 MG/ML IJ SOLN
25.0000 mg | Freq: Once | INTRAMUSCULAR | Status: AC
Start: 2012-08-17 — End: 2012-08-17
  Administered 2012-08-17: 25 mg via INTRAVENOUS
  Filled 2012-08-16: qty 1

## 2012-08-16 MED ORDER — ACETAMINOPHEN 325 MG PO TABS
650.0000 mg | ORAL_TABLET | Freq: Once | ORAL | Status: AC
Start: 2012-08-17 — End: 2012-08-17
  Administered 2012-08-17: 650 mg via ORAL
  Filled 2012-08-16: qty 2

## 2012-08-16 MED ORDER — INFLIXIMAB 100 MG IV SOLR
300.00 mg | Freq: Once | INTRAVENOUS | Status: DC
Start: 2012-08-16 — End: 2012-08-16
  Filled 2012-08-16: qty 300

## 2012-08-16 MED ORDER — DIPHENHYDRAMINE HCL 50 MG/ML IJ SOLN
50.0000 mg | Freq: Once | INTRAMUSCULAR | Status: DC | PRN
Start: 2012-08-17 — End: 2012-08-18

## 2012-08-16 MED ORDER — DIPHENHYDRAMINE HCL 25 MG PO CAPS
50.0000 mg | ORAL_CAPSULE | Freq: Once | ORAL | Status: DC
Start: 2012-08-16 — End: 2012-08-16

## 2012-08-16 MED ORDER — SODIUM CHLORIDE 0.9 % IV SOLN
300.0000 mg | Freq: Once | INTRAVENOUS | Status: AC
Start: 2012-08-17 — End: 2012-08-17
  Administered 2012-08-17: 300 mg via INTRAVENOUS
  Filled 2012-08-16: qty 300

## 2012-08-16 MED ORDER — DIPHENHYDRAMINE HCL 50 MG/ML IJ SOLN
50.00 mg | Freq: Once | INTRAMUSCULAR | Status: DC
Start: 2012-08-17 — End: 2012-08-18

## 2012-08-16 MED ORDER — DIPHENHYDRAMINE HCL 50 MG/ML IJ SOLN
25.00 mg | Freq: Once | INTRAMUSCULAR | Status: DC
Start: 2012-08-16 — End: 2012-08-16

## 2012-08-16 MED ORDER — METHYLPREDNISOLONE SODIUM SUCC 40 MG IJ SOLR
25.0000 mg | Freq: Once | INTRAMUSCULAR | Status: DC | PRN
Start: 2012-08-17 — End: 2012-08-18

## 2012-08-16 MED ORDER — ACETAMINOPHEN 325 MG PO TABS
650.00 mg | ORAL_TABLET | Freq: Once | ORAL | Status: DC
Start: 2012-08-17 — End: 2012-08-16

## 2012-08-17 ENCOUNTER — Ambulatory Visit
Admission: RE | Admit: 2012-08-17 | Discharge: 2012-08-17 | Disposition: A | Discharge status: Home / Self Care | Payer: BC Managed Care – PPO | Source: Ambulatory Visit | Attending: Gastroenterology | Admitting: Gastroenterology

## 2012-08-17 VITALS — BP 114/78 | HR 72 | Temp 97.6°F | Resp 16 | Ht 63.0 in | Wt 145.0 lb

## 2012-08-17 DIAGNOSIS — K5289 Other specified noninfective gastroenteritis and colitis: Secondary | ICD-10-CM | POA: Insufficient documentation

## 2012-08-17 HISTORY — DX: Plantar fascial fibromatosis: M72.2

## 2012-08-17 NOTE — Discharge Instructions (Signed)
Infliximab Solution for injection  What is this medicine?  INFLIXIMAB (in FLIX i mab) is used to treat Crohn's disease and ulcerative colitis. It is also used to treat ankylosing spondylitis, psoriasis, and some forms of arthritis.  This medicine may be used for other purposes; ask your health care provider or pharmacist if you have questions.  What should I tell my health care provider before I take this medicine?  They need to know if you have any of these conditions:   diabetes   exposure to tuberculosis   heart failure   hepatitis or liver disease   immune system problems   infection   lung or breathing disease, like COPD   multiple sclerosis   current or past resident of [place]   seizure disorder   an unusual or allergic reaction to infliximab, mouse proteins, other medicines, foods, dyes, or preservatives   pregnant or trying to get pregnant   breast-feeding  How should I use this medicine?  This medicine is for injection into a vein. It is usually given by a health care professional in a hospital or clinic setting.  A special MedGuide will be given to you by the pharmacist with each prescription and refill. Be sure to read this information carefully each time.  Talk to your pediatrician regarding the use of this medicine in children. Special care may be needed.  Overdosage: If you think you have taken too much of this medicine contact a poison control center or emergency room at once.  NOTE: This medicine is only for you. Do not share this medicine with others.  What if I miss a dose?  It is important not to miss your dose. Call your doctor or health care professional if you are unable to keep an appointment.  What may interact with this medicine?  Do not take this medicine with any of the following medications:   anakinra   rilonacept  This medicine may also interact with the following medications:   vaccines  This list may not describe all possible interactions.  Give your health care provider a list of all the medicines, herbs, non-prescription drugs, or dietary supplements you use. Also tell them if you smoke, drink alcohol, or use illegal drugs. Some items may interact with your medicine.  What should I watch for while using this medicine?  Visit your doctor or health care professional for regular checks on your progress.  If you get a cold or other infection while receiving this medicine, call your doctor or health care professional. Do not treat yourself. This medicine may decrease your body's ability to fight infections. Before beginning therapy, your doctor may do a test to see if you have been exposed to tuberculosis.  This medicine may make the symptoms of heart failure worse in some patients. If you notice symptoms such as increased shortness of breath or swelling of the ankles or legs, contact your health care provider right away.  If you are going to have surgery or dental work, tell your health care professional or dentist that you have received this medicine.  If you take this medicine for plaque psoriasis, stay out of the sun. If you cannot avoid being in the sun, wear protective clothing and use sunscreen. Do not use sun lamps or tanning beds/booths.  What side effects may I notice from receiving this medicine?  Side effects that you should report to your doctor or health care professional as soon as possible:     allergic reactions like skin rash, itching or hives, swelling of the face, lips, or tongue   chest pain   fever or chills, usually related to the infusion   muscle or joint pain   red, scaly patches or raised bumps on the skin   signs of infection - fever or chills, cough, sore throat, pain or difficulty passing urine   swollen lymph nodes in the neck, underarm, or groin areas   unexplained weight loss   unusual bleeding or bruising   unusually weak or tired   yellowing of the eyes or skin  Side effects that usually do not require medical  attention (report to your doctor or health care professional if they continue or are bothersome):   headache   heartburn or stomach pain   nausea, vomiting  This list may not describe all possible side effects. Call your doctor for medical advice about side effects. You may report side effects to FDA at 1-800-FDA-1088.  Where should I keep my medicine?  This drug is given in a hospital or clinic and will not be stored at home.  NOTE:This sheet is a summary. It may not cover all possible information. If you have questions about this medicine, talk to your doctor, pharmacist, or health care provider. Copyright 2013 [employer] Standard

## 2012-08-17 NOTE — Progress Notes (Signed)
1040 Arrived ambulatory to [facility] for a remicade infusion.  States she has been having diarrhea and abdominal pain when she eats.  She wonders if the remicade is still working.  Patient instructed to talk with her gastroenterologist regarding this.  1200 Remicade was started at 10 mL /hour x15 minutes,  then 20 mL/hr x15 minutes,  then 40 mL/hr for 15 minutes,  then 80 mL/hr for 15 min,  then 150 mL/hr for 30 min and  then 250 mL/hr till infusion completed. 1410  Remicade infused without problem.  Patient states she will call MD for problems and to discuss future infusions.  1423  Patient left in NAD, ambulatory to home.

## 2012-10-04 MED ORDER — DIPHENHYDRAMINE HCL 25 MG PO CAPS
50.0000 mg | ORAL_CAPSULE | Freq: Once | ORAL | Status: DC
Start: 2012-10-05 — End: 2012-10-06

## 2012-10-04 MED ORDER — DIPHENHYDRAMINE HCL 25 MG PO CAPS
25.0000 mg | ORAL_CAPSULE | Freq: Once | ORAL | Status: AC
Start: 2012-10-05 — End: 2012-10-05

## 2012-10-04 MED ORDER — METHYLPREDNISOLONE SODIUM SUCC 40 MG IJ SOLR
25.00 mg | Freq: Once | INTRAMUSCULAR | Status: DC | PRN
Start: 2012-10-05 — End: 2012-10-06

## 2012-10-04 MED ORDER — DIPHENHYDRAMINE HCL 50 MG/ML IJ SOLN
50.00 mg | Freq: Once | INTRAMUSCULAR | Status: DC | PRN
Start: 2012-10-05 — End: 2012-10-06

## 2012-10-04 MED ORDER — ACETAMINOPHEN 325 MG PO TABS
650.0000 mg | ORAL_TABLET | Freq: Once | ORAL | Status: AC
Start: 2012-10-05 — End: 2012-10-05
  Administered 2012-10-05: 650 mg via ORAL
  Filled 2012-10-04: qty 2

## 2012-10-04 MED ORDER — DIPHENHYDRAMINE HCL 50 MG/ML IJ SOLN
50.00 mg | Freq: Once | INTRAMUSCULAR | Status: DC
Start: 2012-10-05 — End: 2012-10-06

## 2012-10-04 MED ORDER — SODIUM CHLORIDE 0.9 % IV SOLN
600.0000 mg | Freq: Once | INTRAVENOUS | Status: DC
Start: 2012-10-05 — End: 2012-10-05
  Filled 2012-10-04: qty 600

## 2012-10-04 MED ORDER — DIPHENHYDRAMINE HCL 50 MG/ML IJ SOLN
25.0000 mg | Freq: Once | INTRAMUSCULAR | Status: AC
Start: 2012-10-05 — End: 2012-10-05
  Administered 2012-10-05: 25 mg via INTRAVENOUS
  Filled 2012-10-04: qty 1

## 2012-10-05 ENCOUNTER — Ambulatory Visit
Admission: RE | Admit: 2012-10-05 | Discharge: 2012-10-05 | Disposition: A | Discharge status: Home / Self Care | Payer: BC Managed Care – PPO | Source: Ambulatory Visit | Attending: Gastroenterology | Admitting: Gastroenterology

## 2012-10-05 VITALS — BP 131/81 | HR 51 | Temp 98.3°F | Resp 16 | Ht 63.0 in | Wt 145.0 lb

## 2012-10-05 DIAGNOSIS — K5289 Other specified noninfective gastroenteritis and colitis: Secondary | ICD-10-CM | POA: Insufficient documentation

## 2012-10-05 DIAGNOSIS — K5190 Ulcerative colitis, unspecified, without complications: Secondary | ICD-10-CM

## 2012-10-05 MED ORDER — SODIUM CHLORIDE 0.9 % IV SOLN
700.00 mg | Freq: Once | INTRAVENOUS | Status: AC
Start: 2012-10-05 — End: 2012-10-05
  Administered 2012-10-05: 700 mg via INTRAVENOUS
  Filled 2012-10-05: qty 700

## 2012-10-05 NOTE — Progress Notes (Signed)
1000 Arrived ambulatory to [facility] for a remicade infusion.  States "I hope this stronger dose of  remicade will work".  Lungs clear.  AHR 60 regular.  Ordered premeds given.  1103 Remicade was started at 10 mL /hour x15 minutes,  then 20 mL/hr x15 minutes,  then 40 mL/hr for 15 minutes,  then 80 mL/hr for 15 min,  then 150 mL/hr for 30 min and  then 250 mL/hr till infusion completed.  1312 Remicade infused without problem.  IV Cambria'd and site without inflammation.  AVS reviewed with patient and copy given to patient.  Patient states she will call MD for problems.  1320 Patient left in NAD, ambulatory to home.   RTC 11/23/12.

## 2012-11-22 MED ORDER — DIPHENHYDRAMINE HCL 50 MG/ML IJ SOLN
50.0000 mg | Freq: Once | INTRAMUSCULAR | Status: DC
Start: 2012-11-23 — End: 2012-11-24

## 2012-11-22 MED ORDER — DIPHENHYDRAMINE HCL 25 MG PO CAPS
25.0000 mg | ORAL_CAPSULE | Freq: Once | ORAL | Status: AC
Start: 2012-11-23 — End: 2012-11-23

## 2012-11-22 MED ORDER — METHYLPREDNISOLONE SODIUM SUCC 40 MG IJ SOLR
25.0000 mg | Freq: Once | INTRAMUSCULAR | Status: DC | PRN
Start: 2012-11-23 — End: 2012-11-24

## 2012-11-22 MED ORDER — DIPHENHYDRAMINE HCL 50 MG/ML IJ SOLN
25.0000 mg | Freq: Once | INTRAMUSCULAR | Status: AC
Start: 2012-11-23 — End: 2012-11-23
  Administered 2012-11-23: 25 mg via INTRAVENOUS
  Filled 2012-11-22: qty 1

## 2012-11-22 MED ORDER — DIPHENHYDRAMINE HCL 50 MG/ML IJ SOLN
50.0000 mg | Freq: Once | INTRAMUSCULAR | Status: DC | PRN
Start: 2012-11-23 — End: 2012-11-24

## 2012-11-22 MED ORDER — ACETAMINOPHEN 325 MG PO TABS
650.0000 mg | ORAL_TABLET | Freq: Once | ORAL | Status: AC
Start: 2012-11-23 — End: 2012-11-23
  Administered 2012-11-23: 650 mg via ORAL
  Filled 2012-11-22: qty 2

## 2012-11-22 MED ORDER — SODIUM CHLORIDE 0.9 % IV SOLN
700.0000 mg | Freq: Once | INTRAVENOUS | Status: AC
Start: 2012-11-23 — End: 2012-11-23
  Administered 2012-11-23: 700 mg via INTRAVENOUS
  Filled 2012-11-22: qty 700

## 2012-11-22 MED ORDER — DIPHENHYDRAMINE HCL 25 MG PO CAPS
50.0000 mg | ORAL_CAPSULE | Freq: Once | ORAL | Status: DC
Start: 2012-11-23 — End: 2012-11-24

## 2012-11-23 ENCOUNTER — Ambulatory Visit
Admission: RE | Admit: 2012-11-23 | Discharge: 2012-11-23 | Disposition: A | Discharge status: Home / Self Care | Payer: BC Managed Care – PPO | Source: Ambulatory Visit | Attending: Gastroenterology | Admitting: Gastroenterology

## 2012-11-23 VITALS — BP 124/84 | HR 66 | Temp 97.6°F | Resp 16 | Ht 63.0 in | Wt 145.0 lb

## 2012-11-23 DIAGNOSIS — K5289 Other specified noninfective gastroenteritis and colitis: Secondary | ICD-10-CM | POA: Insufficient documentation

## 2012-11-23 NOTE — Progress Notes (Signed)
1102 Arrived ambulatory to [facility] for a remicade infusion.  No complaints. Lungs clear.  AHR regular.  Patient denies any gi problems at present. Purpose of remicade infusion and possible infusion reaction symptoms discussed with patient and patient told to notify me ASAP if feeling like anything is wrong. Premeds given.  1155 Remicade was started at 10 mL /hour x15 minutes,  then 20 mL/hr x15 minutes,  then 40 mL/hr for 15 minutes,  then 80 mL/hr for 15 min,  then 150 mL/hr for 30 min and  then 250 mL/hr till infusion completed.  1404 Remicade infused without problem.  IV discontinued, good blood return noted and site without inflammation.  AVS reviewed with patient and copy given to patient.  Patient states she will call MD for problems.  1412 Patient left in NAD, ambulatory to home.  RTC 01/11/2013.

## 2012-11-23 NOTE — Discharge Instructions (Signed)
Infliximab Solution for injection  What is this medicine?  INFLIXIMAB (in FLIX i mab) is used to treat Crohn's disease and ulcerative colitis. It is also used to treat ankylosing spondylitis, psoriasis, and some forms of arthritis.  This medicine may be used for other purposes; ask your health care provider or pharmacist if you have questions.  What should I tell my health care provider before I take this medicine?  They need to know if you have any of these conditions:   diabetes   exposure to tuberculosis   heart failure   hepatitis or liver disease   immune system problems   infection   lung or breathing disease, like COPD   multiple sclerosis   current or past resident of [place]   seizure disorder   an unusual or allergic reaction to infliximab, mouse proteins, other medicines, foods, dyes, or preservatives   pregnant or trying to get pregnant   breast-feeding  How should I use this medicine?  This medicine is for injection into a vein. It is usually given by a health care professional in a hospital or clinic setting.  A special MedGuide will be given to you by the pharmacist with each prescription and refill. Be sure to read this information carefully each time.  Talk to your pediatrician regarding the use of this medicine in children. Special care may be needed.  Overdosage: If you think you have taken too much of this medicine contact a poison control center or emergency room at once.  NOTE: This medicine is only for you. Do not share this medicine with others.  What if I miss a dose?  It is important not to miss your dose. Call your doctor or health care professional if you are unable to keep an appointment.  What may interact with this medicine?  Do not take this medicine with any of the following medications:   anakinra   rilonacept  This medicine may also interact with the following medications:   vaccines  This list may not describe all possible interactions.  Give your health care provider a list of all the medicines, herbs, non-prescription drugs, or dietary supplements you use. Also tell them if you smoke, drink alcohol, or use illegal drugs. Some items may interact with your medicine.  What should I watch for while using this medicine?  Visit your doctor or health care professional for regular checks on your progress.  If you get a cold or other infection while receiving this medicine, call your doctor or health care professional. Do not treat yourself. This medicine may decrease your body's ability to fight infections. Before beginning therapy, your doctor may do a test to see if you have been exposed to tuberculosis.  This medicine may make the symptoms of heart failure worse in some patients. If you notice symptoms such as increased shortness of breath or swelling of the ankles or legs, contact your health care provider right away.  If you are going to have surgery or dental work, tell your health care professional or dentist that you have received this medicine.  If you take this medicine for plaque psoriasis, stay out of the sun. If you cannot avoid being in the sun, wear protective clothing and use sunscreen. Do not use sun lamps or tanning beds/booths.  What side effects may I notice from receiving this medicine?  Side effects that you should report to your doctor or health care professional as soon as possible:     allergic reactions like skin rash, itching or hives, swelling of the face, lips, or tongue   chest pain   fever or chills, usually related to the infusion   muscle or joint pain   red, scaly patches or raised bumps on the skin   signs of infection - fever or chills, cough, sore throat, pain or difficulty passing urine   swollen lymph nodes in the neck, underarm, or groin areas   unexplained weight loss   unusual bleeding or bruising   unusually weak or tired   yellowing of the eyes or skin  Side effects that usually do not require medical  attention (report to your doctor or health care professional if they continue or are bothersome):   headache   heartburn or stomach pain   nausea, vomiting  This list may not describe all possible side effects. Call your doctor for medical advice about side effects. You may report side effects to FDA at 1-800-FDA-1088.  Where should I keep my medicine?  This drug is given in a hospital or clinic and will not be stored at home.  NOTE:This sheet is a summary. It may not cover all possible information. If you have questions about this medicine, talk to your doctor, pharmacist, or health care provider. Copyright 2014 [employer]

## 2013-01-10 MED ORDER — DIPHENHYDRAMINE HCL 25 MG PO CAPS
50.0000 mg | ORAL_CAPSULE | Freq: Once | ORAL | Status: DC
Start: 2013-01-11 — End: 2013-01-12

## 2013-01-10 MED ORDER — DIPHENHYDRAMINE HCL 50 MG/ML IJ SOLN
50.00 mg | Freq: Once | INTRAMUSCULAR | Status: DC | PRN
Start: 2013-01-11 — End: 2013-01-12

## 2013-01-10 MED ORDER — DIPHENHYDRAMINE HCL 50 MG/ML IJ SOLN
25.0000 mg | Freq: Once | INTRAMUSCULAR | Status: AC
Start: 2013-01-10 — End: 2013-01-11
  Administered 2013-01-11: 25 mg via INTRAVENOUS
  Filled 2013-01-10: qty 1

## 2013-01-10 MED ORDER — ACETAMINOPHEN 325 MG PO TABS
650.0000 mg | ORAL_TABLET | Freq: Once | ORAL | Status: AC
Start: 2013-01-11 — End: 2013-01-11
  Administered 2013-01-11: 650 mg via ORAL
  Filled 2013-01-10: qty 2

## 2013-01-10 MED ORDER — SODIUM CHLORIDE 0.9 % IV SOLN
700.0000 mg | Freq: Once | INTRAVENOUS | Status: AC
Start: 2013-01-11 — End: 2013-01-11
  Administered 2013-01-11: 700 mg via INTRAVENOUS
  Filled 2013-01-10: qty 700

## 2013-01-10 MED ORDER — METHYLPREDNISOLONE SODIUM SUCC 40 MG IJ SOLR
25.0000 mg | Freq: Once | INTRAMUSCULAR | Status: DC | PRN
Start: 2013-01-11 — End: 2013-01-12

## 2013-01-10 MED ORDER — DIPHENHYDRAMINE HCL 50 MG/ML IJ SOLN
50.0000 mg | Freq: Once | INTRAMUSCULAR | Status: DC
Start: 2013-01-11 — End: 2013-01-12

## 2013-01-10 MED ORDER — DIPHENHYDRAMINE HCL 25 MG PO CAPS
25.00 mg | ORAL_CAPSULE | Freq: Once | ORAL | Status: DC
Start: 2013-01-10 — End: 2013-01-12

## 2013-01-11 ENCOUNTER — Ambulatory Visit
Admission: RE | Admit: 2013-01-11 | Discharge: 2013-01-11 | Disposition: A | Discharge status: Home / Self Care | Payer: BC Managed Care – PPO | Source: Ambulatory Visit | Attending: Gastroenterology | Admitting: Gastroenterology

## 2013-01-11 VITALS — BP 108/75 | HR 75 | Temp 98.5°F | Resp 16 | Ht 63.0 in | Wt 145.0 lb

## 2013-01-11 DIAGNOSIS — K5289 Other specified noninfective gastroenteritis and colitis: Secondary | ICD-10-CM | POA: Insufficient documentation

## 2013-01-11 DIAGNOSIS — K5190 Ulcerative colitis, unspecified, without complications: Secondary | ICD-10-CM

## 2013-01-11 NOTE — Progress Notes (Signed)
0957 Arrived ambulatory to [facility] for a remicade infusion.  No complaints. Lungs clear.  AHR regular.  Patient denies any gi problems at present.  Purpose of remicade infusion and possible infusion reaction symptoms discussed with patient and patient told to notify me ASAP if feeling like anything is wrong.  Premeds given.  1033 Remicade was started at 10 mL /hour x15 minutes,  then 20 mL/hr x15 minutes,  then 40 mL/hr for 15 minutes,  then 80 mL/hr for 15 min,  then 150 mL/hr for 30 min and  then 250 mL/hr till infusion completed.  1239 Remicade infused without problem.  IV discontinued, good blood return noted and site without inflammation.  AVS reviewed with patient and copy given to patient. Patient states she will call MD for problems.  1250 Patient left in NAD, ambulatory to home.  RTC 03/01/2013.

## 2013-01-11 NOTE — Discharge Instructions (Signed)
Management of Ulcerative Colitis: Lifestyle  You can lead a full life even if you have ulcerative colitis. Focus on keeping your symptoms under control. And don't let this disease isolate you. By planning ahead and working with support groups, you can find ways to cope. And you may even help others who have ulcerative colitis.    Ulcerative colitis is a type of inflammatory bowel disease (IBD).   Have a Plan  Make this your goal: "Ulcerative colitis won't keep me from the activities I enjoy." You may need to do some planning to reach that goal. But by staying positive, you can help make sure you're in control--not ulcerative colitis. Here are some other tips:   Know where to find clean bathrooms.   Eat more small meals instead of three big meals, especially when on the road or when you don't have easy access to bathrooms.   If you've had a recent flare-up, eat foods that you know will limit your symptoms. Keep those foods on hand, both at home and at work.   Get some exercise every day.   Take a stress reduction class.   If going on a long trip, discuss your plans with your health care provider. He or she can teach you what to do if you have a flare-up while on the road.  Find a Support Group  Ulcerative colitis support groups can help you with many concerns you may have. Other people have felt much of what you may be feeling. Just knowing that you're not alone can be a great comfort. Or someone in a support group may offer a travel tip or a coping skill that's perfect for you. And don't forget how satisfying it can feel to help another ulcerative colitis patient who's in need. Contact the [facility] [employer] toll-free at 800-932-2423.  Managing Nutrition  You may be able to eat most foods until you have a flare-up. But like anyone else, you need to make healthy eating choices. Some of the healthiest foods can make symptoms worse, though. Keeping track of your "problem foods" may be helpful. Ask  yourhealth care providerany questions you have about healthy eating.  Avoid Your Problem Foods  There's no rule for which foods can be a problem. How you feel after eating them is the best guide. You may need to avoid high-fiber foods and foods that are hard to digest. These can include fresh fruits and vegetables. High-fat foods, such as whole-milk dairy products and red meat, also can worsen symptoms in a flare-up. Write down what you eat and how it affects you. If one kind of food often gives you trouble, stay away from it. Also note the foods that work well for you. Yourhealth care providermay have you see a nutritionist to come up with the best food choices for you. A nutritionist can help ensure that you eat foods that are "safe" while getting proper nourishment.  Foods That Are Often "Safe"  No two people respond the same to all foods. But these choices are often "safe" to eat during a flare-up:   Applesauce   Melba toast   Flavored gelatin   Vanilla pudding   Custard   White rice   Plain pasta  Canned peaches or pears   Baked potatoes   Tuna packed in water   Mashedpotatoes   Skinless chicken   Instant oatmeal    2000-2014 [employer], [address], [place] [address]. All rights reserved. This information is   not intended as a substitute for professional medical care. Always follow your healthcare professional's instructions.      Infliximab Solution for injection  What is this medicine?  INFLIXIMAB (in FLIX i mab) is used to treat Crohn's disease and ulcerative colitis. It is also used to treat ankylosing spondylitis, psoriasis, and some forms of arthritis.  This medicine may be used for other purposes; ask your health care provider or pharmacist if you have questions.  What should I tell my health care provider before I take this medicine?  They need to know if you have any of these conditions:   diabetes   exposure to tuberculosis   heart failure   hepatitis or liver  disease   immune system problems   infection   lung or breathing disease, like COPD   multiple sclerosis   current or past resident of [place]   seizure disorder   an unusual or allergic reaction to infliximab, mouse proteins, other medicines, foods, dyes, or preservatives   pregnant or trying to get pregnant   breast-feeding  How should I use this medicine?  This medicine is for injection into a vein. It is usually given by a health care professional in a hospital or clinic setting.  A special MedGuide will be given to you by the pharmacist with each prescription and refill. Be sure to read this information carefully each time.  Talk to your pediatrician regarding the use of this medicine in children. Special care may be needed.  Overdosage: If you think you have taken too much of this medicine contact a poison control center or emergency room at once.  NOTE: This medicine is only for you. Do not share this medicine with others.  What if I miss a dose?  It is important not to miss your dose. Call your doctor or health care professional if you are unable to keep an appointment.  What may interact with this medicine?  Do not take this medicine with any of the following medications:   anakinra   rilonacept  This medicine may also interact with the following medications:   vaccines  This list may not describe all possible interactions. Give your health care provider a list of all the medicines, herbs, non-prescription drugs, or dietary supplements you use. Also tell them if you smoke, drink alcohol, or use illegal drugs. Some items may interact with your medicine.  What should I watch for while using this medicine?  Visit your doctor or health care professional for regular checks on your progress.  If you get a cold or other infection while receiving this medicine, call your doctor or health care professional. Do not treat yourself. This medicine may decrease your body's ability to  fight infections. Before beginning therapy, your doctor may do a test to see if you have been exposed to tuberculosis.  This medicine may make the symptoms of heart failure worse in some patients. If you notice symptoms such as increased shortness of breath or swelling of the ankles or legs, contact your health care provider right away.  If you are going to have surgery or dental work, tell your health care professional or dentist that you have received this medicine.  If you take this medicine for plaque psoriasis, stay out of the sun. If you cannot avoid being in the sun, wear protective clothing and use sunscreen. Do not use sun lamps or tanning beds/booths.  What side effects may I notice from receiving   this medicine?  Side effects that you should report to your doctor or health care professional as soon as possible:   allergic reactions like skin rash, itching or hives, swelling of the face, lips, or tongue   chest pain   fever or chills, usually related to the infusion   muscle or joint pain   red, scaly patches or raised bumps on the skin   signs of infection - fever or chills, cough, sore throat, pain or difficulty passing urine   swollen lymph nodes in the neck, underarm, or groin areas   unexplained weight loss   unusual bleeding or bruising   unusually weak or tired   yellowing of the eyes or skin  Side effects that usually do not require medical attention (report to your doctor or health care professional if they continue or are bothersome):   headache   heartburn or stomach pain   nausea, vomiting  This list may not describe all possible side effects. Call your doctor for medical advice about side effects. You may report side effects to FDA at 1-800-FDA-1088.  Where should I keep my medicine?  This drug is given in a hospital or clinic and will not be stored at home.  NOTE:This sheet is a summary. It may not cover all possible information. If you have questions about this medicine, talk to  your doctor, pharmacist, or health care provider. Copyright 2014 [employer]

## 2013-02-14 MED ORDER — METHYLPREDNISOLONE SODIUM SUCC 40 MG IJ SOLR
25.0000 mg | Freq: Once | INTRAMUSCULAR | Status: DC | PRN
Start: 2013-02-15 — End: 2013-02-16

## 2013-02-14 MED ORDER — DIPHENHYDRAMINE HCL 50 MG/ML IJ SOLN
50.00 mg | Freq: Once | INTRAMUSCULAR | Status: AC
Start: 2013-02-15 — End: 2013-02-15
  Administered 2013-02-15: 25 mg via INTRAVENOUS
  Filled 2013-02-14: qty 1

## 2013-02-14 MED ORDER — SODIUM CHLORIDE 0.9 % IV SOLN
700.0000 mg | Freq: Once | INTRAVENOUS | Status: AC
Start: 2013-02-15 — End: 2013-02-15
  Administered 2013-02-15: 700 mg via INTRAVENOUS
  Filled 2013-02-14: qty 700

## 2013-02-14 MED ORDER — DIPHENHYDRAMINE HCL 25 MG PO CAPS
50.0000 mg | ORAL_CAPSULE | Freq: Once | ORAL | Status: DC
Start: 2013-02-15 — End: 2013-02-16

## 2013-02-14 MED ORDER — ACETAMINOPHEN 325 MG PO TABS
650.0000 mg | ORAL_TABLET | Freq: Once | ORAL | Status: AC
Start: 2013-02-15 — End: 2013-02-15
  Administered 2013-02-15: 650 mg via ORAL
  Filled 2013-02-14: qty 2

## 2013-02-14 MED ORDER — DIPHENHYDRAMINE HCL 50 MG/ML IJ SOLN
50.0000 mg | Freq: Once | INTRAMUSCULAR | Status: DC | PRN
Start: 2013-02-15 — End: 2013-02-16

## 2013-02-15 ENCOUNTER — Ambulatory Visit
Admission: RE | Admit: 2013-02-15 | Discharge: 2013-02-15 | Disposition: A | Discharge status: Home / Self Care | Payer: BC Managed Care – PPO | Source: Ambulatory Visit | Attending: Gastroenterology | Admitting: Gastroenterology

## 2013-02-15 VITALS — BP 125/76 | HR 65 | Temp 96.8°F | Resp 16 | Ht 63.0 in | Wt 145.0 lb

## 2013-02-15 DIAGNOSIS — K5289 Other specified noninfective gastroenteritis and colitis: Secondary | ICD-10-CM | POA: Insufficient documentation

## 2013-02-15 NOTE — Discharge Instructions (Signed)
1015   AFTER REMICADE WATCH FOR ALLERGIC REACTIONS SUCH AS : itching or hives, swelling in your face or hands, swelling or tingling in your mouth or throat,chest tightness, trouble breathing. Watch for a change in the color of your urine and stools, change in the amount and how often you urinate. Fast,slow,or pounding heartbeat or chest pain. Fever,headache, sore throat, chills  or body aches, joint or muscle pain.    Also lightheadedness,  dizziness, fainting, nausea, vomiting, loss of appetite, or pain in your upper stomach.  Numbness, tingling, or burning pain in your hands, arms, legs, or feet. Problems with vision, speech, or walking.   Seizures. Swollen glands. Trouble breathing or swallowing.  Unexplained weight  loss or night sweats or the yellowing of your skin and or the whites of your eyes.  Call your doctor if any of these occur and or if concerns. Ellese Julius L Eliazer Hemphill RN BC

## 2013-02-15 NOTE — Progress Notes (Signed)
1010 [name] is a 52 y.o. female here today for an infusion. Lungs are clear. AHR regular. Premeds given.  0920 Pt is here for Remicade today.  Medication started at 10cc hr for 15 minutes, then advanced to 20cchr for 15 minutes. Advanced to 40cc hr for 15 min, 80cchr for 15 minutes, to 150 cchr for 30 minutes and to 250cc hr til finished. Pt understands possible complications of drug and when to call the doctor for problems and for follow up. 1245 Medication finished and pt without problems, so IV  Removed. IV site without redness  or swelling. AVS reviewed with patient . 1250 Pt left walking to go home.  Liliane Shi RN [facility]

## 2013-03-01 ENCOUNTER — Ambulatory Visit: Payer: BC Managed Care – PPO

## 2013-04-04 MED ORDER — DIPHENHYDRAMINE HCL 50 MG/ML IJ SOLN
50.0000 mg | Freq: Once | INTRAMUSCULAR | Status: DC | PRN
Start: 2013-04-05 — End: 2013-04-07

## 2013-04-04 MED ORDER — DIPHENHYDRAMINE HCL 50 MG/ML IJ SOLN
25.0000 mg | Freq: Once | INTRAMUSCULAR | Status: AC
Start: 2013-04-05 — End: 2013-04-05
  Administered 2013-04-05: 25 mg via INTRAVENOUS
  Filled 2013-04-04: qty 1

## 2013-04-04 MED ORDER — SODIUM CHLORIDE 0.9 % IV SOLN
700.0000 mg | Freq: Once | INTRAVENOUS | Status: AC
Start: 2013-04-05 — End: 2013-04-05
  Administered 2013-04-05: 700 mg via INTRAVENOUS
  Filled 2013-04-04: qty 700

## 2013-04-04 MED ORDER — DIPHENHYDRAMINE HCL 25 MG PO CAPS
25.0000 mg | ORAL_CAPSULE | Freq: Once | ORAL | Status: AC
Start: 2013-04-05 — End: 2013-04-05

## 2013-04-04 MED ORDER — ACETAMINOPHEN 325 MG PO TABS
650.0000 mg | ORAL_TABLET | Freq: Once | ORAL | Status: AC
Start: 2013-04-05 — End: 2013-04-05
  Administered 2013-04-05: 650 mg via ORAL
  Filled 2013-04-04: qty 2

## 2013-04-04 MED ORDER — METHYLPREDNISOLONE SODIUM SUCC 40 MG IJ SOLR
25.0000 mg | Freq: Once | INTRAMUSCULAR | Status: DC | PRN
Start: 2013-04-05 — End: 2013-04-07

## 2013-04-05 ENCOUNTER — Ambulatory Visit
Admission: RE | Admit: 2013-04-05 | Discharge: 2013-04-05 | Disposition: A | Discharge status: Home / Self Care | Payer: BC Managed Care – PPO | Source: Ambulatory Visit | Attending: Gastroenterology | Admitting: Gastroenterology

## 2013-04-05 VITALS — BP 119/79 | HR 63 | Temp 97.1°F | Resp 16 | Ht 63.0 in | Wt 144.0 lb

## 2013-04-05 DIAGNOSIS — K5190 Ulcerative colitis, unspecified, without complications: Secondary | ICD-10-CM

## 2013-04-05 DIAGNOSIS — K5289 Other specified noninfective gastroenteritis and colitis: Secondary | ICD-10-CM | POA: Insufficient documentation

## 2013-04-05 NOTE — Discharge Instructions (Signed)
Management of Ulcerative Colitis: Lifestyle  You can lead a full life even if you have ulcerative colitis. Focus on keeping your symptoms under control. And don't let this disease isolate you. By planning ahead and working with support groups, you can find ways to cope. And you may even help others who have ulcerative colitis.    Ulcerative colitis is a type of inflammatory bowel disease (IBD).   Have a Plan  Make this your goal: "Ulcerative colitis won't keep me from the activities I enjoy." You may need to do some planning to reach that goal. But by staying positive, you can help make sure you're in control--not ulcerative colitis. Here are some other tips:   Know where to find clean bathrooms.   Eat more small meals instead of three big meals, especially when on the road or when you don't have easy access to bathrooms.   If you've had a recent flare-up, eat foods that you know will limit your symptoms. Keep those foods on hand, both at home and at work.   Get some exercise every day.   Take a stress reduction class.   If going on a long trip, discuss your plans with your health care provider. He or she can teach you what to do if you have a flare-up while on the road.  Find a Support Group  Ulcerative colitis support groups can help you with many concerns you may have. Other people have felt much of what you may be feeling. Just knowing that you're not alone can be a great comfort. Or someone in a support group may offer a travel tip or a coping skill that's perfect for you. And don't forget how satisfying it can feel to help another ulcerative colitis patient who's in need. Contact the [facility] [employer] toll-free at 800-932-2423.  Managing Nutrition  You may be able to eat most foods until you have a flare-up. But like anyone else, you need to make healthy eating choices. Some of the healthiest foods can make symptoms worse, though. Keeping track of your "problem foods" may be helpful. Ask  yourhealth care providerany questions you have about healthy eating.  Avoid Your Problem Foods  There's no rule for which foods can be a problem. How you feel after eating them is the best guide. You may need to avoid high-fiber foods and foods that are hard to digest. These can include fresh fruits and vegetables. High-fat foods, such as whole-milk dairy products and red meat, also can worsen symptoms in a flare-up. Write down what you eat and how it affects you. If one kind of food often gives you trouble, stay away from it. Also note the foods that work well for you. Yourhealth care providermay have you see a nutritionist to come up with the best food choices for you. A nutritionist can help ensure that you eat foods that are "safe" while getting proper nourishment.  Foods That Are Often "Safe"  No two people respond the same to all foods. But these choices are often "safe" to eat during a flare-up:   Applesauce   Melba toast   Flavored gelatin   Vanilla pudding   Custard   White rice   Plain pasta  Canned peaches or pears   Baked potatoes   Tuna packed in water   Mashedpotatoes   Skinless chicken   Instant oatmeal    2000-2014 [employer], [address], [place] [address]. All rights reserved. This information is   not intended as a substitute for professional medical care. Always follow your healthcare professional's instructions.      Infliximab Solution for injection  What is this medicine?  INFLIXIMAB (in FLIX i mab) is used to treat Crohn's disease and ulcerative colitis. It is also used to treat ankylosing spondylitis, psoriasis, and some forms of arthritis.  This medicine may be used for other purposes; ask your health care provider or pharmacist if you have questions.  What should I tell my health care provider before I take this medicine?  They need to know if you have any of these conditions:   diabetes   exposure to tuberculosis   heart failure   hepatitis or liver  disease   immune system problems   infection   lung or breathing disease, like COPD   multiple sclerosis   current or past resident of [place]   seizure disorder   an unusual or allergic reaction to infliximab, mouse proteins, other medicines, foods, dyes, or preservatives   pregnant or trying to get pregnant   breast-feeding  How should I use this medicine?  This medicine is for injection into a vein. It is usually given by a health care professional in a hospital or clinic setting.  A special MedGuide will be given to you by the pharmacist with each prescription and refill. Be sure to read this information carefully each time.  Talk to your pediatrician regarding the use of this medicine in children. Special care may be needed.  Overdosage: If you think you have taken too much of this medicine contact a poison control center or emergency room at once.  NOTE: This medicine is only for you. Do not share this medicine with others.  What if I miss a dose?  It is important not to miss your dose. Call your doctor or health care professional if you are unable to keep an appointment.  What may interact with this medicine?  Do not take this medicine with any of the following medications:   anakinra   rilonacept  This medicine may also interact with the following medications:   vaccines  This list may not describe all possible interactions. Give your health care provider a list of all the medicines, herbs, non-prescription drugs, or dietary supplements you use. Also tell them if you smoke, drink alcohol, or use illegal drugs. Some items may interact with your medicine.  What should I watch for while using this medicine?  Visit your doctor or health care professional for regular checks on your progress.  If you get a cold or other infection while receiving this medicine, call your doctor or health care professional. Do not treat yourself. This medicine may decrease your body's ability to  fight infections. Before beginning therapy, your doctor may do a test to see if you have been exposed to tuberculosis.  This medicine may make the symptoms of heart failure worse in some patients. If you notice symptoms such as increased shortness of breath or swelling of the ankles or legs, contact your health care provider right away.  If you are going to have surgery or dental work, tell your health care professional or dentist that you have received this medicine.  If you take this medicine for plaque psoriasis, stay out of the sun. If you cannot avoid being in the sun, wear protective clothing and use sunscreen. Do not use sun lamps or tanning beds/booths.  What side effects may I notice from receiving   this medicine?  Side effects that you should report to your doctor or health care professional as soon as possible:   allergic reactions like skin rash, itching or hives, swelling of the face, lips, or tongue   chest pain   fever or chills, usually related to the infusion   muscle or joint pain   red, scaly patches or raised bumps on the skin   signs of infection - fever or chills, cough, sore throat, pain or difficulty passing urine   swollen lymph nodes in the neck, underarm, or groin areas   unexplained weight loss   unusual bleeding or bruising   unusually weak or tired   yellowing of the eyes or skin  Side effects that usually do not require medical attention (report to your doctor or health care professional if they continue or are bothersome):   headache   heartburn or stomach pain   nausea, vomiting  This list may not describe all possible side effects. Call your doctor for medical advice about side effects. You may report side effects to FDA at 1-800-FDA-1088.  Where should I keep my medicine?  This drug is given in a hospital or clinic and will not be stored at home.  NOTE:This sheet is a summary. It may not cover all possible information. If you have questions about this medicine, talk to  your doctor, pharmacist, or health care provider. Copyright 2014 [employer]

## 2013-04-05 NOTE — Progress Notes (Signed)
5409  Arrived ambulatory to [facility] for a remicade infusion.  No complaints. Lungs clear.  AHR regular.  Patient denies any gi problems at present. Purpose of remicade infusion and possible infusion reaction symptoms discussed with patient and patient told to notify me ASAP if feeling like anything is wrong.  Premeds given.  1023 Remicade was started at 10 mL /hour x15 minutes,  then 20 mL/hr x15 minutes,  then 40 mL/hr for 15 minutes,  then 80 mL/hr for 15 min,  then 150 mL/hr for 30 min and  then 250 mL/hr till infusion completed.  1230 Remicade infused without problem.  IV discontinued, good blood return noted and site without inflammation.  AVS reviewed with patient and copy given to patient. Patient states she will call MD for problems.  1238 Patient left in NAD, ambulatory to home.  RTC 05/24/2013.

## 2013-05-23 MED ORDER — DIPHENHYDRAMINE HCL 50 MG/ML IJ SOLN
50.0000 mg | Freq: Once | INTRAMUSCULAR | Status: DC | PRN
Start: 2013-05-24 — End: 2013-05-25

## 2013-05-23 MED ORDER — SODIUM CHLORIDE 0.9 % IV SOLN
700.0000 mg | Freq: Once | INTRAVENOUS | Status: AC
Start: 2013-05-24 — End: 2013-05-24
  Administered 2013-05-24: 700 mg via INTRAVENOUS
  Filled 2013-05-23: qty 700

## 2013-05-23 MED ORDER — DIPHENHYDRAMINE HCL 25 MG PO CAPS
25.0000 mg | ORAL_CAPSULE | Freq: Once | ORAL | Status: DC
Start: 2013-05-24 — End: 2013-05-25

## 2013-05-23 MED ORDER — ACETAMINOPHEN 325 MG PO TABS
650.0000 mg | ORAL_TABLET | Freq: Once | ORAL | Status: DC
Start: 2013-05-24 — End: 2013-05-25
  Filled 2013-05-23: qty 2

## 2013-05-23 MED ORDER — ACETAMINOPHEN 325 MG PO TABS
650.0000 mg | ORAL_TABLET | Freq: Once | ORAL | Status: AC
Start: 2013-05-24 — End: 2013-05-24
  Administered 2013-05-24: 650 mg via ORAL

## 2013-05-23 MED ORDER — DIPHENHYDRAMINE HCL 50 MG/ML IJ SOLN
25.00 mg | Freq: Once | INTRAMUSCULAR | Status: AC
Start: 2013-05-24 — End: 2013-05-24
  Administered 2013-05-24: 25 mg via INTRAVENOUS
  Filled 2013-05-23: qty 1

## 2013-05-23 MED ORDER — METHYLPREDNISOLONE SODIUM SUCC 40 MG IJ SOLR
25.0000 mg | Freq: Once | INTRAMUSCULAR | Status: DC | PRN
Start: 2013-05-24 — End: 2013-05-25

## 2013-05-24 ENCOUNTER — Ambulatory Visit
Admission: RE | Admit: 2013-05-24 | Discharge: 2013-05-24 | Disposition: A | Discharge status: Home / Self Care | Payer: BC Managed Care – PPO | Source: Ambulatory Visit | Attending: Gastroenterology | Admitting: Gastroenterology

## 2013-05-24 VITALS — BP 141/90 | HR 56 | Temp 97.8°F | Resp 16 | Ht 63.0 in | Wt 147.0 lb

## 2013-05-24 DIAGNOSIS — K5289 Other specified noninfective gastroenteritis and colitis: Secondary | ICD-10-CM | POA: Insufficient documentation

## 2013-05-24 NOTE — Progress Notes (Signed)
1002 [name] is a 52 y/o female who arrived ambulatory to [facility] for a remicade infusion.  No complaints. Lungs clear.  AHR regular.  Patient denies any gi problems at present. Purpose of remicade infusion and possible infusion reaction symptoms discussed with patient and patient told to notify me ASAP if feeling like anything is wrong.  Ordered premeds given.  1057 Remicade was started at 10 mL /hour x15 minutes,  then 20 mL/hr x15 minutes,  then 40 mL/hr for 15 minutes,  then 80 mL/hr for 15 min,  then 150 mL/hr for 30 min and  then 250 mL/hr till infusion completed.  1304 Remicade infused without problem.  IV discontinued, good blood return noted and site without inflammation.  AVS reviewed with patient and copy given to patient. Patient states she will call MD for problems.  1310 Patient left in NAD, ambulatory to home.  RTC 07/12/2013.

## 2013-05-24 NOTE — Discharge Instructions (Signed)
Management of Ulcerative Colitis: Lifestyle  You can lead a full life even if you have ulcerative colitis. Focus on keeping your symptoms under control. And don't let this disease isolate you. By planning ahead and working with support groups, you can find ways to cope. And you may even help others who have ulcerative colitis.    Ulcerative colitis is a type of inflammatory bowel disease (IBD).   Have a Plan  Make this your goal: "Ulcerative colitis won't keep me from the activities I enjoy." You may need to do some planning to reach that goal. But by staying positive, you can help make sure you're in control--not ulcerative colitis. Here are some other tips:   Know where to find clean bathrooms.   Eat more small meals instead of three big meals, especially when on the road or when you don't have easy access to bathrooms.   If you've had a recent flare-up, eat foods that you know will limit your symptoms. Keep those foods on hand, both at home and at work.   Get some exercise every day.   Take a stress reduction class.   If going on a long trip, discuss your plans with your health care provider. He or she can teach you what to do if you have a flare-up while on the road.  Find a Support Group  Ulcerative colitis support groups can help you with many concerns you may have. Other people have felt much of what you may be feeling. Just knowing that you're not alone can be a great comfort. Or someone in a support group may offer a travel tip or a coping skill that's perfect for you. And don't forget how satisfying it can feel to help another ulcerative colitis patient who's in need. Contact the [facility] [employer] toll-free at 800-932-2423.  Managing Nutrition  You may be able to eat most foods until you have a flare-up. But like anyone else, you need to make healthy eating choices. Some of the healthiest foods can make symptoms worse, though. Keeping track of your "problem foods" may be helpful. Ask  yourhealth care providerany questions you have about healthy eating.  Avoid Your Problem Foods  There's no rule for which foods can be a problem. How you feel after eating them is the best guide. You may need to avoid high-fiber foods and foods that are hard to digest. These can include fresh fruits and vegetables. High-fat foods, such as whole-milk dairy products and red meat, also can worsen symptoms in a flare-up. Write down what you eat and how it affects you. If one kind of food often gives you trouble, stay away from it. Also note the foods that work well for you. Yourhealth care providermay have you see a nutritionist to come up with the best food choices for you. A nutritionist can help ensure that you eat foods that are "safe" while getting proper nourishment.  Foods That Are Often "Safe"  No two people respond the same to all foods. But these choices are often "safe" to eat during a flare-up:   Applesauce   Melba toast   Flavored gelatin   Vanilla pudding   Custard   White rice   Plain pasta  Canned peaches or pears   Baked potatoes   Tuna packed in water   Mashedpotatoes   Skinless chicken   Instant oatmeal    2000-2014 [employer], [address], [place] [address]. All rights reserved. This information is   not intended as a substitute for professional medical care. Always follow your healthcare professional's instructions.      Infliximab Solution for injection  What is this medicine?  INFLIXIMAB (in FLIX i mab) is used to treat Crohn's disease and ulcerative colitis. It is also used to treat ankylosing spondylitis, psoriasis, and some forms of arthritis.  This medicine may be used for other purposes; ask your health care provider or pharmacist if you have questions.  What should I tell my health care provider before I take this medicine?  They need to know if you have any of these conditions:   diabetes   exposure to tuberculosis   heart failure   hepatitis or liver  disease   immune system problems   infection   lung or breathing disease, like COPD   multiple sclerosis   current or past resident of [place]   seizure disorder   an unusual or allergic reaction to infliximab, mouse proteins, other medicines, foods, dyes, or preservatives   pregnant or trying to get pregnant   breast-feeding  How should I use this medicine?  This medicine is for injection into a vein. It is usually given by a health care professional in a hospital or clinic setting.  A special MedGuide will be given to you by the pharmacist with each prescription and refill. Be sure to read this information carefully each time.  Talk to your pediatrician regarding the use of this medicine in children. Special care may be needed.  Overdosage: If you think you have taken too much of this medicine contact a poison control center or emergency room at once.  NOTE: This medicine is only for you. Do not share this medicine with others.  What if I miss a dose?  It is important not to miss your dose. Call your doctor or health care professional if you are unable to keep an appointment.  What may interact with this medicine?  Do not take this medicine with any of the following medications:   anakinra   rilonacept  This medicine may also interact with the following medications:   vaccines  This list may not describe all possible interactions. Give your health care provider a list of all the medicines, herbs, non-prescription drugs, or dietary supplements you use. Also tell them if you smoke, drink alcohol, or use illegal drugs. Some items may interact with your medicine.  What should I watch for while using this medicine?  Visit your doctor or health care professional for regular checks on your progress.  If you get a cold or other infection while receiving this medicine, call your doctor or health care professional. Do not treat yourself. This medicine may decrease your body's ability to  fight infections. Before beginning therapy, your doctor may do a test to see if you have been exposed to tuberculosis.  This medicine may make the symptoms of heart failure worse in some patients. If you notice symptoms such as increased shortness of breath or swelling of the ankles or legs, contact your health care provider right away.  If you are going to have surgery or dental work, tell your health care professional or dentist that you have received this medicine.  If you take this medicine for plaque psoriasis, stay out of the sun. If you cannot avoid being in the sun, wear protective clothing and use sunscreen. Do not use sun lamps or tanning beds/booths.  What side effects may I notice from receiving   this medicine?  Side effects that you should report to your doctor or health care professional as soon as possible:   allergic reactions like skin rash, itching or hives, swelling of the face, lips, or tongue   chest pain   fever or chills, usually related to the infusion   muscle or joint pain   red, scaly patches or raised bumps on the skin   signs of infection - fever or chills, cough, sore throat, pain or difficulty passing urine   swollen lymph nodes in the neck, underarm, or groin areas   unexplained weight loss   unusual bleeding or bruising   unusually weak or tired   yellowing of the eyes or skin  Side effects that usually do not require medical attention (report to your doctor or health care professional if they continue or are bothersome):   headache   heartburn or stomach pain   nausea, vomiting  This list may not describe all possible side effects. Call your doctor for medical advice about side effects. You may report side effects to FDA at 1-800-FDA-1088.  Where should I keep my medicine?  This drug is given in a hospital or clinic and will not be stored at home.  NOTE:This sheet is a summary. It may not cover all possible information. If you have questions about this medicine, talk to  your doctor, pharmacist, or health care provider. Copyright 2014 [employer]

## 2013-07-11 MED ORDER — DIPHENHYDRAMINE HCL 50 MG/ML IJ SOLN
50.0000 mg | Freq: Once | INTRAMUSCULAR | Status: DC | PRN
Start: 2013-07-12 — End: 2013-07-13

## 2013-07-11 MED ORDER — SODIUM CHLORIDE 0.9 % IV SOLN
700.0000 mg | Freq: Once | INTRAVENOUS | Status: AC
Start: 2013-07-12 — End: 2013-07-12
  Administered 2013-07-12: 700 mg via INTRAVENOUS
  Filled 2013-07-11: qty 700

## 2013-07-11 MED ORDER — DIPHENHYDRAMINE HCL 25 MG PO CAPS
50.0000 mg | ORAL_CAPSULE | Freq: Once | ORAL | Status: DC
Start: 2013-07-12 — End: 2013-07-13

## 2013-07-11 MED ORDER — ACETAMINOPHEN 325 MG PO TABS
650.0000 mg | ORAL_TABLET | Freq: Once | ORAL | Status: AC
Start: 2013-07-12 — End: 2013-07-12
  Administered 2013-07-12: 650 mg via ORAL
  Filled 2013-07-11: qty 2

## 2013-07-11 MED ORDER — DIPHENHYDRAMINE HCL 50 MG/ML IJ SOLN
25.0000 mg | Freq: Once | INTRAMUSCULAR | Status: AC
Start: 2013-07-12 — End: 2013-07-12
  Filled 2013-07-11: qty 1

## 2013-07-11 MED ORDER — DIPHENHYDRAMINE HCL 25 MG PO CAPS
25.0000 mg | ORAL_CAPSULE | Freq: Once | ORAL | Status: DC
Start: 2013-07-12 — End: 2013-07-13

## 2013-07-11 MED ORDER — DIPHENHYDRAMINE HCL 50 MG/ML IJ SOLN
50.0000 mg | Freq: Once | INTRAMUSCULAR | Status: DC
Start: 2013-07-12 — End: 2013-07-13

## 2013-07-11 MED ORDER — METHYLPREDNISOLONE SODIUM SUCC 40 MG IJ SOLR
25.0000 mg | Freq: Once | INTRAMUSCULAR | Status: DC | PRN
Start: 2013-07-12 — End: 2013-07-13

## 2013-07-11 MED ORDER — ACETAMINOPHEN 325 MG PO TABS
650.0000 mg | ORAL_TABLET | Freq: Once | ORAL | Status: DC
Start: 2013-07-11 — End: 2013-07-11

## 2013-07-12 ENCOUNTER — Ambulatory Visit
Admission: RE | Admit: 2013-07-12 | Discharge: 2013-07-12 | Disposition: A | Discharge status: Home / Self Care | Payer: BC Managed Care – PPO | Source: Ambulatory Visit | Attending: Gastroenterology | Admitting: Gastroenterology

## 2013-07-12 ENCOUNTER — Other Ambulatory Visit: Payer: Self-pay | Admitting: Internal Medicine

## 2013-07-12 ENCOUNTER — Ambulatory Visit
Admission: RE | Admit: 2013-07-12 | Discharge: 2013-07-12 | Disposition: A | Discharge status: Home / Self Care | Payer: BC Managed Care – PPO | Source: Ambulatory Visit | Attending: Internal Medicine | Admitting: Internal Medicine

## 2013-07-12 VITALS — BP 132/87 | HR 68 | Temp 96.7°F | Resp 18 | Ht 63.0 in | Wt 154.0 lb

## 2013-07-12 DIAGNOSIS — R609 Edema, unspecified: Secondary | ICD-10-CM

## 2013-07-12 DIAGNOSIS — I82402 Acute embolism and thrombosis of unspecified deep veins of left lower extremity: Secondary | ICD-10-CM

## 2013-07-12 DIAGNOSIS — M25469 Effusion, unspecified knee: Secondary | ICD-10-CM | POA: Insufficient documentation

## 2013-07-12 DIAGNOSIS — M7989 Other specified soft tissue disorders: Secondary | ICD-10-CM | POA: Insufficient documentation

## 2013-07-12 DIAGNOSIS — K5190 Ulcerative colitis, unspecified, without complications: Secondary | ICD-10-CM

## 2013-07-12 DIAGNOSIS — K5289 Other specified noninfective gastroenteritis and colitis: Secondary | ICD-10-CM | POA: Insufficient documentation

## 2013-07-12 DIAGNOSIS — M7120 Synovial cyst of popliteal space [Baker], unspecified knee: Secondary | ICD-10-CM | POA: Insufficient documentation

## 2013-07-12 NOTE — Progress Notes (Signed)
1015 [name] is a 53 y.o. female here today for an infusion.  Lungs are clear.  Heart rate regular. Left knee swollen and painful .  Doppler done this morning here.   Pt going to the doctor today post infusion.  Pre meds given. 1030 Pt is here for Remicade today.  Medication started at 10cc hr for 15 minutes, then advanced to 20cchr for 15 minutes. Advanced to 40cc hr for 15 min, 80cchr for 15 minutes, to 150 cchr for 30 minutes and to 250cc hr til finished. Pt understands possible complications of drug and when to call the doctor for problems and for follow up. 1230  medication finished and pt without any problems, so IV removed.  1235 Pt left walking to go to her doctor for her knee. Liliane Shi RN [facility]

## 2013-07-12 NOTE — Discharge Instructions (Signed)
1130   AFTER REMICADE WATCH FOR ALLERGIC REACTIONS SUCH AS : itching or hives, swelling in your face or hands, swelling or tingling in your mouth or throat,chest tightness, trouble breathing. Watch for a change in the color of your urine and stools, change in the amount and how often you urinate. Fast,slow,or pounding heartbeat or chest pain. Fever,headache, sore throat, chills  or body aches, joint or muscle pain.    Also lightheadedness,  dizziness, fainting, nausea, vomiting, loss of appetite, or pain in your upper stomach.  Numbness, tingling, or burning pain in your hands, arms, legs, or feet. Problems with vision, speech, or walking.   Seizures. Swollen glands. Trouble breathing or swallowing.  Unexplained weight  loss or night sweats or the yellowing of your skin and or the whites of your eyes.  Call your doctor if any of these occur and or if concerns. Jarielys Girardot L Yoav Okane RN BC

## 2013-08-29 MED ORDER — DIPHENHYDRAMINE HCL 25 MG PO CAPS
50.0000 mg | ORAL_CAPSULE | Freq: Once | ORAL | Status: DC
Start: 2013-08-30 — End: 2013-08-31

## 2013-08-29 MED ORDER — DIPHENHYDRAMINE HCL 25 MG PO CAPS
25.0000 mg | ORAL_CAPSULE | Freq: Once | ORAL | Status: DC
Start: 2013-08-30 — End: 2013-08-31

## 2013-08-29 MED ORDER — DIPHENHYDRAMINE HCL 50 MG/ML IJ SOLN
50.0000 mg | Freq: Once | INTRAMUSCULAR | Status: DC | PRN
Start: 2013-08-30 — End: 2013-08-31

## 2013-08-29 MED ORDER — ACETAMINOPHEN 325 MG PO TABS
650.0000 mg | ORAL_TABLET | Freq: Once | ORAL | Status: AC
Start: 2013-08-30 — End: 2013-08-30
  Administered 2013-08-30: 650 mg via ORAL
  Filled 2013-08-29: qty 2

## 2013-08-29 MED ORDER — DIPHENHYDRAMINE HCL 50 MG/ML IJ SOLN
50.0000 mg | Freq: Once | INTRAMUSCULAR | Status: DC
Start: 2013-08-30 — End: 2013-08-31

## 2013-08-29 MED ORDER — DIPHENHYDRAMINE HCL 50 MG/ML IJ SOLN
25.0000 mg | Freq: Once | INTRAMUSCULAR | Status: AC
Start: 2013-08-30 — End: 2013-08-30
  Administered 2013-08-30: 25 mg via INTRAVENOUS
  Filled 2013-08-29: qty 1

## 2013-08-29 MED ORDER — SODIUM CHLORIDE 0.9 % IV SOLN
700.0000 mg | Freq: Once | INTRAVENOUS | Status: AC
Start: 2013-08-30 — End: 2013-08-30
  Administered 2013-08-30: 700 mg via INTRAVENOUS
  Filled 2013-08-29: qty 700

## 2013-08-29 MED ORDER — METHYLPREDNISOLONE SODIUM SUCC 40 MG IJ SOLR
25.0000 mg | Freq: Once | INTRAMUSCULAR | Status: DC | PRN
Start: 2013-08-30 — End: 2013-08-31

## 2013-08-30 ENCOUNTER — Ambulatory Visit
Admission: RE | Admit: 2013-08-30 | Discharge: 2013-08-30 | Disposition: A | Discharge status: Home / Self Care | Payer: BC Managed Care – PPO | Source: Ambulatory Visit | Attending: Gastroenterology | Admitting: Gastroenterology

## 2013-08-30 VITALS — BP 149/94 | HR 60 | Temp 96.9°F | Resp 20 | Ht 63.0 in | Wt 153.6 lb

## 2013-08-30 DIAGNOSIS — K5190 Ulcerative colitis, unspecified, without complications: Secondary | ICD-10-CM

## 2013-08-30 DIAGNOSIS — K5289 Other specified noninfective gastroenteritis and colitis: Secondary | ICD-10-CM | POA: Insufficient documentation

## 2013-08-30 NOTE — Addendum Note (Signed)
Encounter addended by: Lia Hopping, RN on: 08/30/2013  4:07 PM<BR>     Documentation filed: Visit Diagnoses, Follow-up Section, Charges VN

## 2013-08-30 NOTE — Discharge Instructions (Signed)
Discharge Instructions for Ulcerative Colitis  You have been diagnosed with ulcerative colitis. Ulcerative colitisis inflammation (irritation and swelling) that occurs in the rectum and colon. It is a form of inflammatory bowel disease (IBD). No one knows what causes IBD, but the symptoms can be treated. People with IBD can lead full,active lives.  Home Care   Follow the diet that was prescribed for you in the hospital.   Avoid any foods that make your symptoms worse. These foods vary from person to person.   Keep a diary of foods that disagree with you and share this information with your doctor or nutritionist.   Take your medications as directed. The doctor may ask you to take several different types.   Talk to your doctor about the need for surgery. Some patientsneed to have their colon removed. This treatment has side effects. Only you and your doctor can make this decision.  Follow-Up  Make a follow-up appointment as directed by our staff.  When to Call Your Doctor  Call your doctor immediately if you have any of the following:   Bleeding from your rectum   Worsening pain, new pain, or cramping in your abdomen   Bloody diarrhea   Fever above 100F (37.7C)   Weight loss   Nausea   Vomiting    [address], [address], [place], [place] [address]. All rights reserved. This information is not intended as a substitute for professional medical care. Always follow your healthcare professional's instructions.      What Is Ulcerative Colitis?  Ulcerative colitis is a chronic disease that causes inflammation and sores called ulcers in the inner lining of the rectum and colon. It is a form of inflammatory bowel disease (IBD). No one knows for certainwhat causes ulcerative colitis, but symptoms can be treated. People with ulcerative colitis can lead full, active lives.     Ulcerative colitis affects the inside layers of the rectum and colon.   Symptoms of Ulcerative Colitis  Symptoms  are often related to bowel movements. Symptoms include:   Frequent, loose bowel movements   Blood and pus in stools, or rectal bleeding   Feeling of incomplete bowel movement   Urgency (feeling that you need to have a bowel movement right away)   Crampy, abdominal discomfort   Loss of appetite   Fatigue   Joint pain   Rectal pain that comes and goes  Your Treatment Options  Your doctor will discuss your treatment options with you. They may include medications, lifestyle changes, surgery, or a combination of these. Treatment helps you stay as active as you want to be. Keep in mind that ulcerative colitis is considered chronic. That means it usually can't be cured. But treatment may ease symptoms. And even though you have a chronic illness, you can still live a full life.  Medications  Your doctor will try to find the medications that work best for you. These may include:   A type of anti-inflammatory medication (called 5-ASA compounds or mesalamine) to help reduce intestinal swelling and discomfort   Corticosteroids to help reduce inflammation (swelling, short-term irritation)   Antibiotics to fight bacteria, if there is aninfection   Medications to control your body's immune system, such as immunomodulators or biologic agents  Lifestyle Changes   Certain foods can worsen symptoms. You may need to change what you eat. Avoid any food that makes your symptoms worse. These foods vary from person to person. But high-fiber foods (such as fresh vegetables)  and high-fat foods (such as dairy products and red meat) cause symptoms in many people. Keep track of foods that cause you problems.   To a lesser degree, stress also can worsen symptoms. Reducing stress may help. Techniques like relaxation exercises, meditation, and deep breathing can help you control stress. Your healthcare provider may be able to tell you more about these.  If Surgery Is Needed  Surgery may help control or even cure ulcerative colitis  by removing a severely affected part of the colon. If this is an option for you, your doctor can give you more information.   [address], [address], [place], [place] [address]. All rights reserved. This information is not intended as a substitute for professional medical care. Always follow your healthcare professional's instructions.      Management of Ulcerative Colitis: Lifestyle  You can lead a full life even if you have ulcerative colitis. Focus on keeping your symptoms under control. And don't let this disease isolate you. By planning ahead and working with support groups, you can find ways to cope. And you may even help others who have ulcerative colitis.    Ulcerative colitis is a type of inflammatory bowel disease (IBD).   Have a Plan  Make this your goal: "Ulcerative colitis won't keep me from the activities I enjoy." You may need to do some planning to reach that goal. But by staying positive, you can help make sure you're in control--not ulcerative colitis. Here are some other tips:   Know where to find clean bathrooms.   Eat more small meals instead of three big meals, especially when on the road or when you don't have easy access to bathrooms.   If you've had a recent flare-up, eat foods that you know will limit your symptoms. Keep those foods on hand, both at home and at work.   Get some exercise every day.   Take a stress reduction class.   If going on a long trip, discuss your plans with your health care provider. He or she can teach you what to do if you have a flare-up while on the road.  Find a Support Group  Ulcerative colitis support groups can help you with many concerns you may have. Other people have felt much of what you may be feeling. Just knowing that you're not alone can be a great comfort. Or someone in a support group may offer a travel tip or a coping skill that's perfect for you. And don't forget how satisfying it can feel to help another ulcerative colitis  patient who's in need. Contact the [employer] toll-free at (720)818-1264.  Managing Nutrition  You may be able to eat most foods until you have a flare-up. But like anyone else, you need to make healthy eating choices. Some of the healthiest foods can make symptoms worse, though. Keeping track of your "problem foods" may be helpful. Ask yourhealth care providerany questions you have about healthy eating.  Avoid Your Problem Foods  There's no rule for which foods can be a problem. How you feel after eating them is the best guide. You may need to avoid high-fiber foods and foods that are hard to digest. These can include fresh fruits and vegetables. High-fat foods, such as whole-milk dairy products and red meat, also can worsen symptoms in a flare-up. Write down what you eat and how it affects you. If one kind of food often gives you trouble, stay away from it. Also  note the foods that work well for you. Yourhealth care providermay have you see a nutritionist to come up with the best food choices for you. A nutritionist can help ensure that you eat foods that are "safe" while getting proper nourishment.  Foods That Are Often "Safe"  No two people respond the same to all foods. But these choices are often "safe" to eat during a flare-up:   Applesauce   Melba toast   Flavored gelatin   Vanilla pudding   Custard   White rice   Plain pasta  Canned peaches or pears   Baked potatoes   Tuna packed in water   Mashedpotatoes   Skinless chicken   Instant oatmeal    [address] [place], [place] [address]. All rights reserved. This information is not intended as a substitute for professional medical care. Always follow your healthcare professional's instructions.      Ulcerative Colitis  You have been diagnosed with ulcerative colitis. Ulcerative colitis is inflammation that occurs in the rectum and colon. It is a form of Inflammatory Bowel Disease (IBD). No one  knows what causes IBD, but the symptoms can be treated and people with IBD can lead full, active lives.  Home Care:   Follow the diet that was prescribed for you by your doctor.   Avoid any foods that make your symptoms worse. These foods vary from person to person.   Keep a diary of foods that disagree with you, and share this information with your doctor or nutritionist.   Take your medications as directed.  Follow Up  with your doctor or as advised by our staff. Report any unintended weight loss over 10 pounds over 3-6 months to your doctor.  Get Prompt Medical Attention  if any of the following occur:   Bleeding from your rectum   Frequent diarrhea or abdominal pain not controlled by your medicine   Bloody diarrhea   Fever of 100.52F (38C) or higher, or as directed by your healthcare provider   Persistent nausea or repeated vomiting   [address] [place], [place] [address]. All rights reserved. This information is not intended as a substitute for professional medical care. Always follow your healthcare professional's instructions.      Infliximab Solution for injection  What is this medicine?  INFLIXIMAB (in FLIX i mab) is used to treat Crohn's disease and ulcerative colitis. It is also used to treat ankylosing spondylitis, psoriasis, and some forms of arthritis.  This medicine may be used for other purposes; ask your health care provider or pharmacist if you have questions.  What should I tell my health care provider before I take this medicine?  They need to know if you have any of these conditions:   diabetes   exposure to tuberculosis   heart failure   hepatitis or liver disease   immune system problems   infection   lung or breathing disease, like COPD   multiple sclerosis   current or past resident of [place] or [place]   seizure disorder   an unusual or allergic reaction to infliximab, mouse proteins, other medicines, foods, dyes, or  preservatives   pregnant or trying to get pregnant   breast-feeding  How should I use this medicine?  This medicine is for injection into a vein. It is usually given by a health care professional in a hospital or clinic setting.  A special MedGuide will be given to you  by the pharmacist with each prescription and refill. Be sure to read this information carefully each time.  Talk to your pediatrician regarding the use of this medicine in children. Special care may be needed.  Overdosage: If you think you have taken too much of this medicine contact a poison control center or emergency room at once.  NOTE: This medicine is only for you. Do not share this medicine with others.  What if I miss a dose?  It is important not to miss your dose. Call your doctor or health care professional if you are unable to keep an appointment.  What may interact with this medicine?  Do not take this medicine with any of the following medications:   anakinra   rilonacept  This medicine may also interact with the following medications:   vaccines  This list may not describe all possible interactions. Give your health care provider a list of all the medicines, herbs, non-prescription drugs, or dietary supplements you use. Also tell them if you smoke, drink alcohol, or use illegal drugs. Some items may interact with your medicine.  What should I watch for while using this medicine?  Visit your doctor or health care professional for regular checks on your progress.  If you get a cold or other infection while receiving this medicine, call your doctor or health care professional. Do not treat yourself. This medicine may decrease your body's ability to fight infections. Before beginning therapy, your doctor may do a test to see if you have been exposed to tuberculosis.  This medicine may make the symptoms of heart failure worse in some patients. If you notice symptoms such as increased shortness of breath or swelling of the ankles or legs,  contact your health care provider right away.  If you are going to have surgery or dental work, tell your health care professional or dentist that you have received this medicine.  If you take this medicine for plaque psoriasis, stay out of the sun. If you cannot avoid being in the sun, wear protective clothing and use sunscreen. Do not use sun lamps or tanning beds/booths.  What side effects may I notice from receiving this medicine?  Side effects that you should report to your doctor or health care professional as soon as possible:   allergic reactions like skin rash, itching or hives, swelling of the face, lips, or tongue   chest pain   fever or chills, usually related to the infusion   muscle or joint pain   red, scaly patches or raised bumps on the skin   signs of infection - fever or chills, cough, sore throat, pain or difficulty passing urine   swollen lymph nodes in the neck, underarm, or groin areas   unexplained weight loss   unusual bleeding or bruising   unusually weak or tired   yellowing of the eyes or skin  Side effects that usually do not require medical attention (report to your doctor or health care professional if they continue or are bothersome):   headache   heartburn or stomach pain   nausea, vomiting  This list may not describe all possible side effects. Call your doctor for medical advice about side effects. You may report side effects to FDA at 1-800-FDA-1088.  Where should I keep my medicine?  This drug is given in a hospital or clinic and will not be stored at home.  NOTE:This sheet is a summary. It may not cover all possible information. If you have  questions about this medicine, talk to your doctor, pharmacist, or health care provider. Copyright 2014 [employer]

## 2013-08-30 NOTE — Progress Notes (Signed)
1002 [name] is a 53 y/o female who arrived ambulatory to [facility] for a remicade infusion. Lungs clear.  AHR regular.  Pt tearful when discussing ulcerative colitis. States "I don't feel like Remicade is working anymore". Pt wants to receive infusion today and scheduled next visit in April but is seeing a new GI doctor this month to discuss options. Possible infusion reaction symptoms discussed with patient and patient told to notify me ASAP if feeling like anything is wrong. Premedicated with Tylenol PO an 25mg  Benadryl IV. 1042 Remicade started at 70ml/hour x30 minutes,  then 80 mL/hr x30 minutes,  then 150 mL/hr for 30 minutes,  then 250 mL/hr for remainder of infusion.  Remicade infused without problem.  IV discontinued, good blood return noted and site without inflammation. Pt BP high consistently during this visit, takes antihypertensives. Took this AM. Keeps journal record of BP at home and states it has been in the normal range. Pt will continue to monitor upon discharge. Pt has area of redness to mid chest, located between breasts, that has small puncture opening. States she had a cyst that was draining badly and has been seen by physician. No longer draining but remains red. Has an appointment on 09/19/13 to have removed.  AVS reviewed with patient and copy given to patient. Patient states she will call MD for problems. 1250 Patient left in stable condition, ambulatory to home.  RTC 10/18/13

## 2013-10-18 ENCOUNTER — Ambulatory Visit: Payer: BC Managed Care – PPO

## 2013-11-20 IMAGING — RF DG FEMUR 2+V*R*
1 series · 4 of 4 positions shown · non-contrast
Comparison: 05/14/2012

CLINICAL DATA: Fixation of proximal femur fracture.

RIGHT FEMUR - 2 VIEW

[Series 1: run · 4 of 4 slices shown]
[im 1/4]
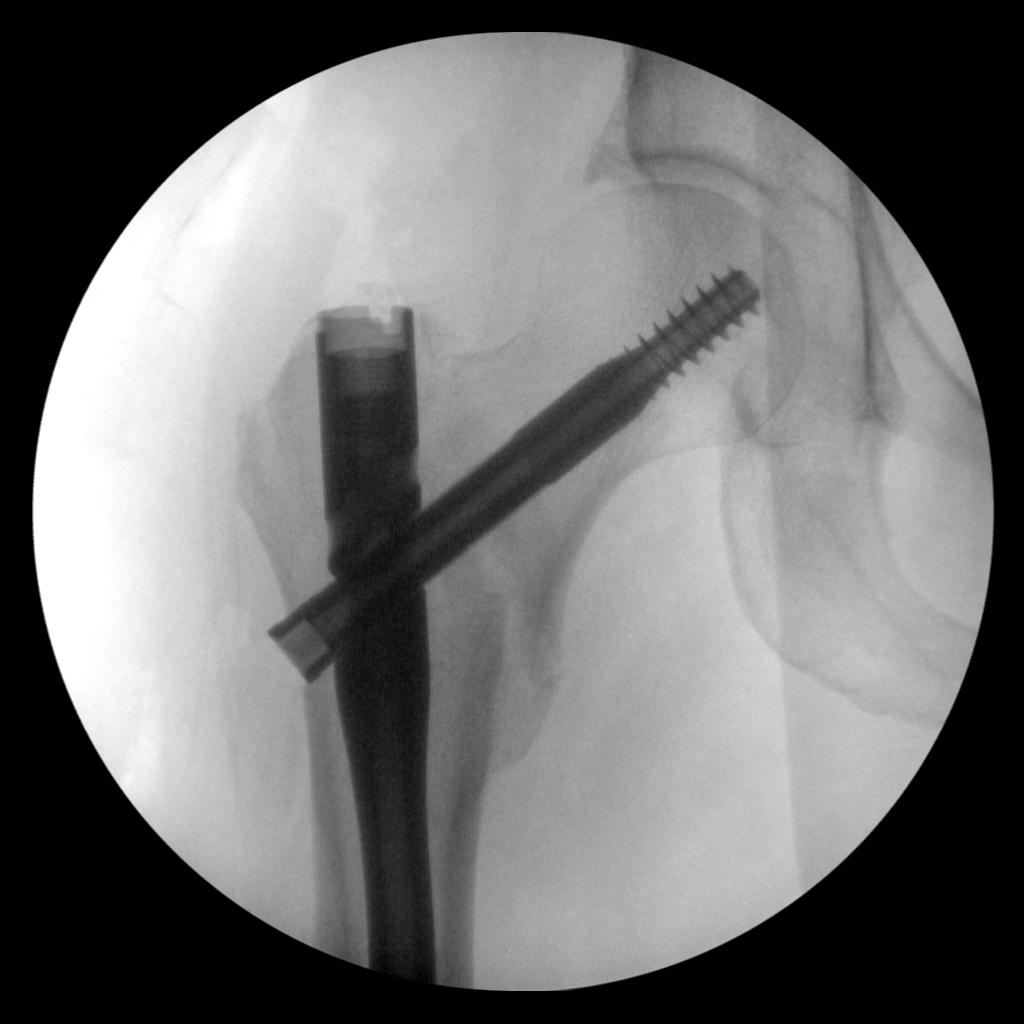
[im 2/4]
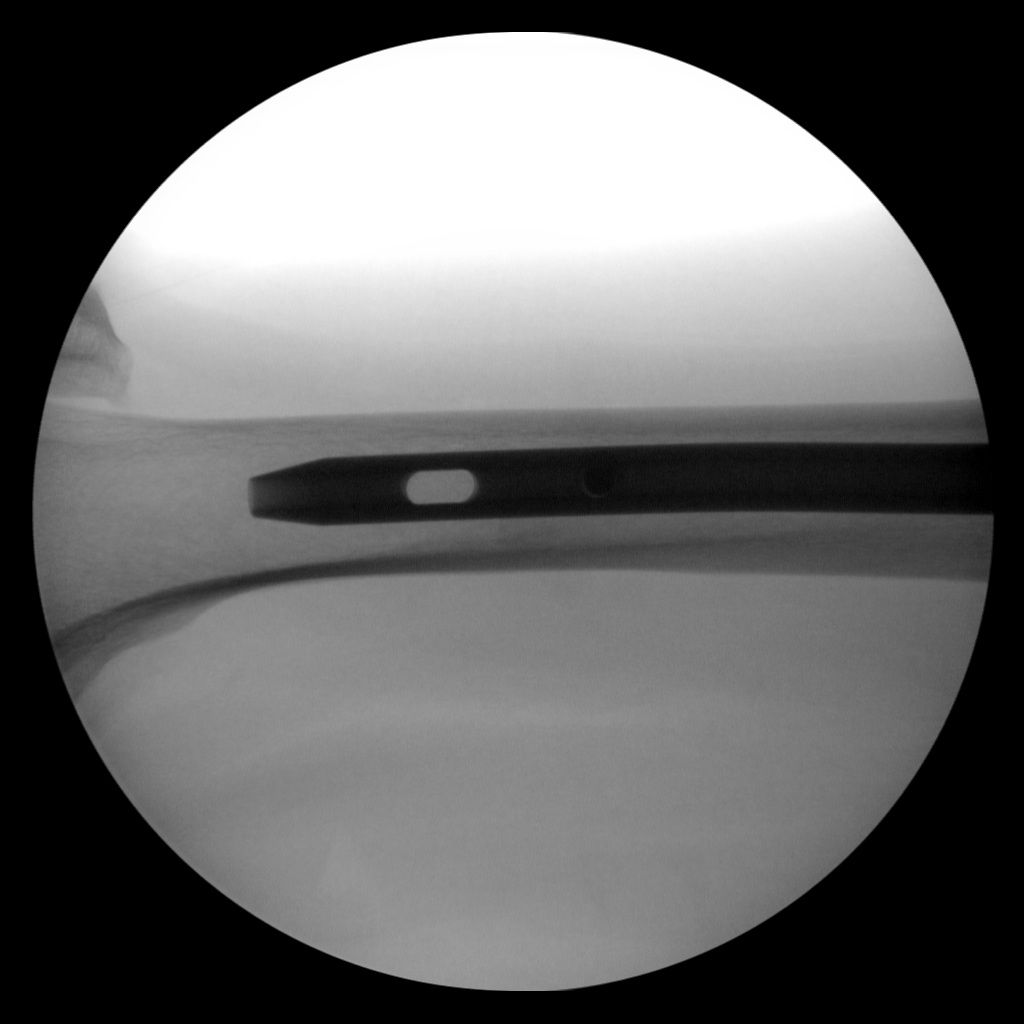
[im 3/4]
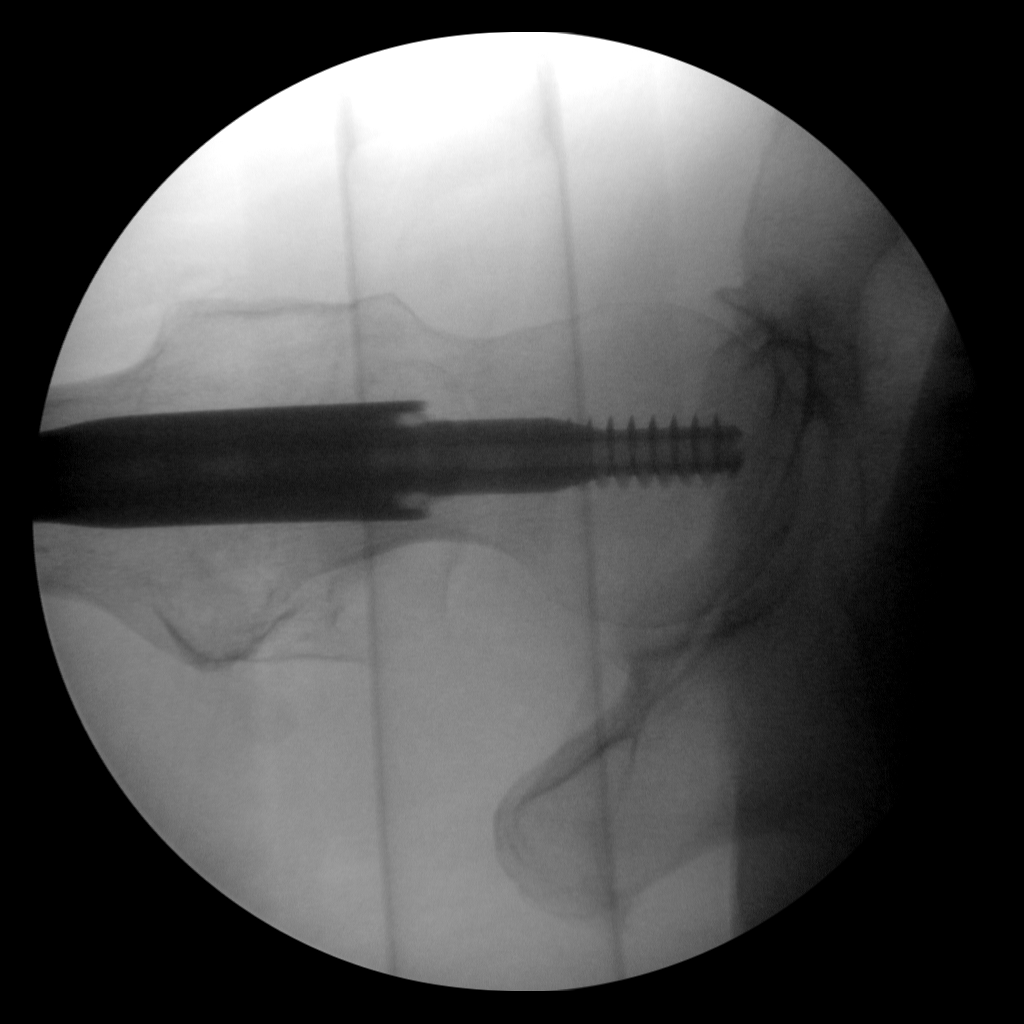
[im 4/4]
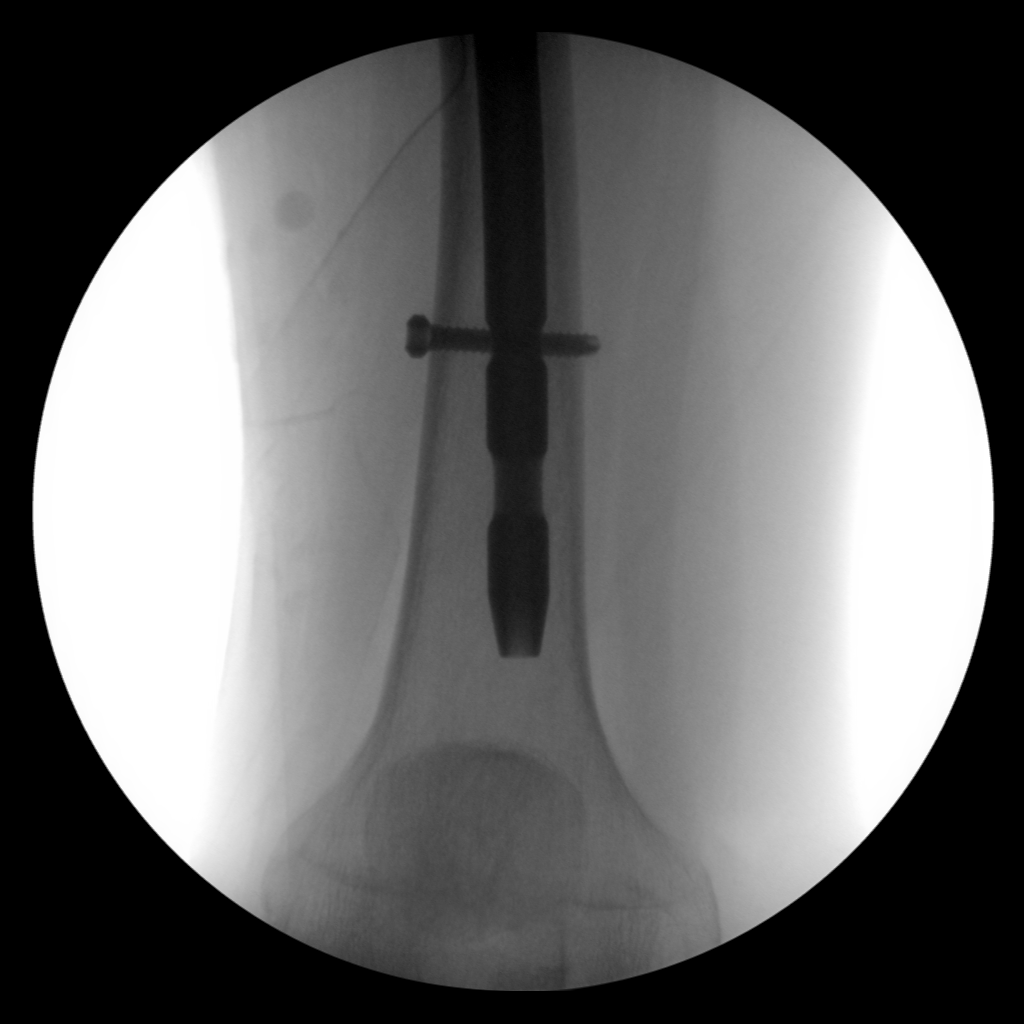

[4 of 4 positions shown; findings below may reference images not displayed]

FINDINGS: 4 intraoperative views.  These demonstrate placement of
intermedullary rod and screw fixation device across the previously
described intertrochanteric femur fracture.  No acute hardware
complication identified.
IMPRESSION: Intraoperative imaging of proximal femoral fixation.

## 2013-11-20 IMAGING — CR DG FEMUR 2+V PORT*R*
1 series · 4 of 4 positions shown · non-contrast
Comparison: Plain films 05/14/2012.

CLINICAL DATA: Status post fracture fixation.

PORTABLE RIGHT FEMUR - 2 VIEW

[Series 1: AP · right · 4 of 4 slices shown]
[im 1/4]
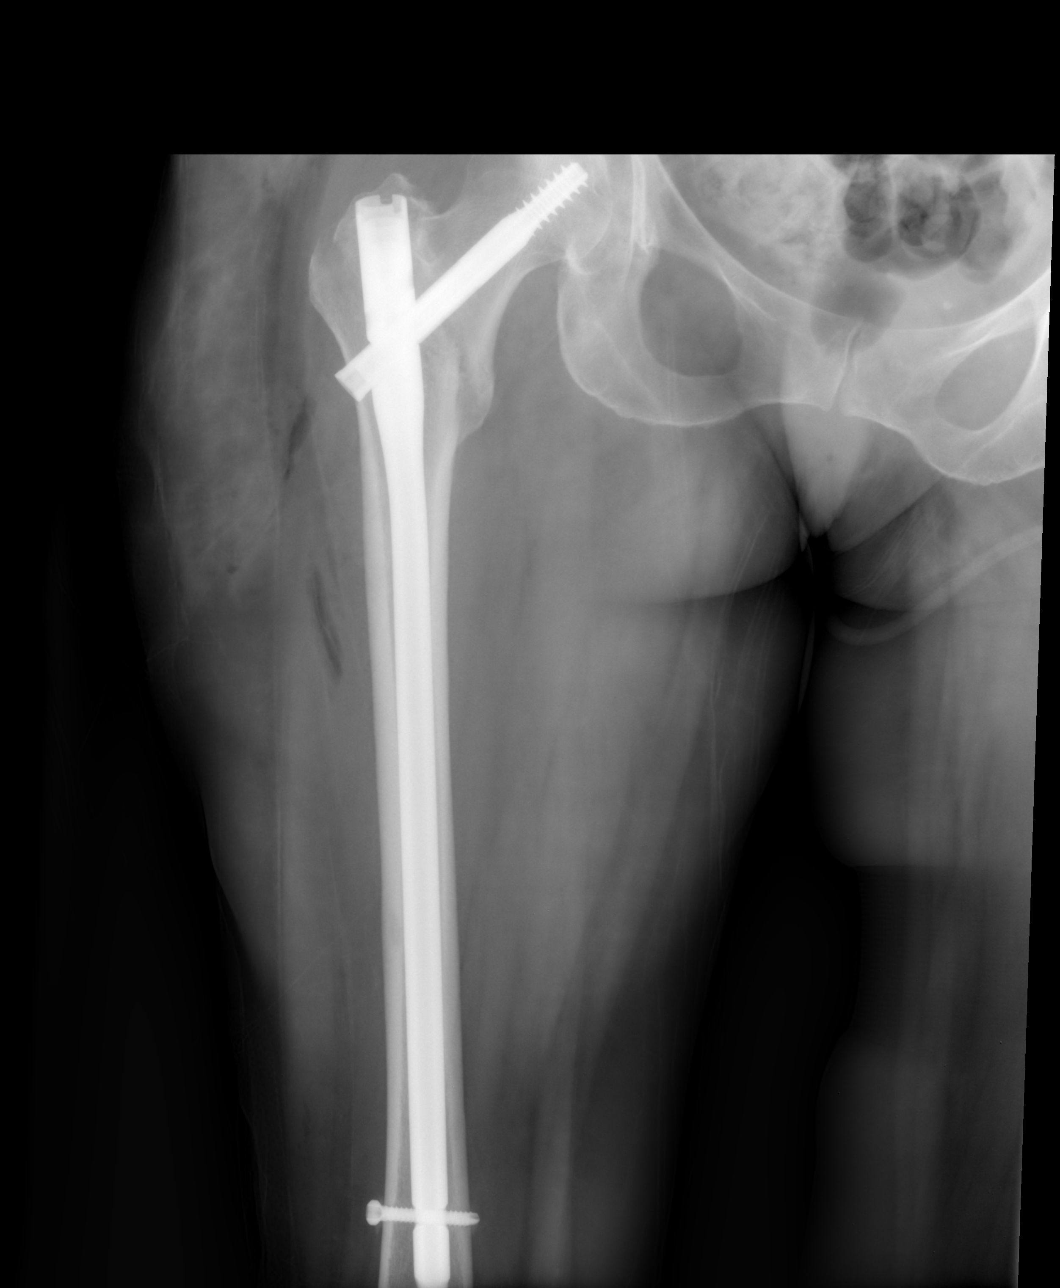
[im 2/4]
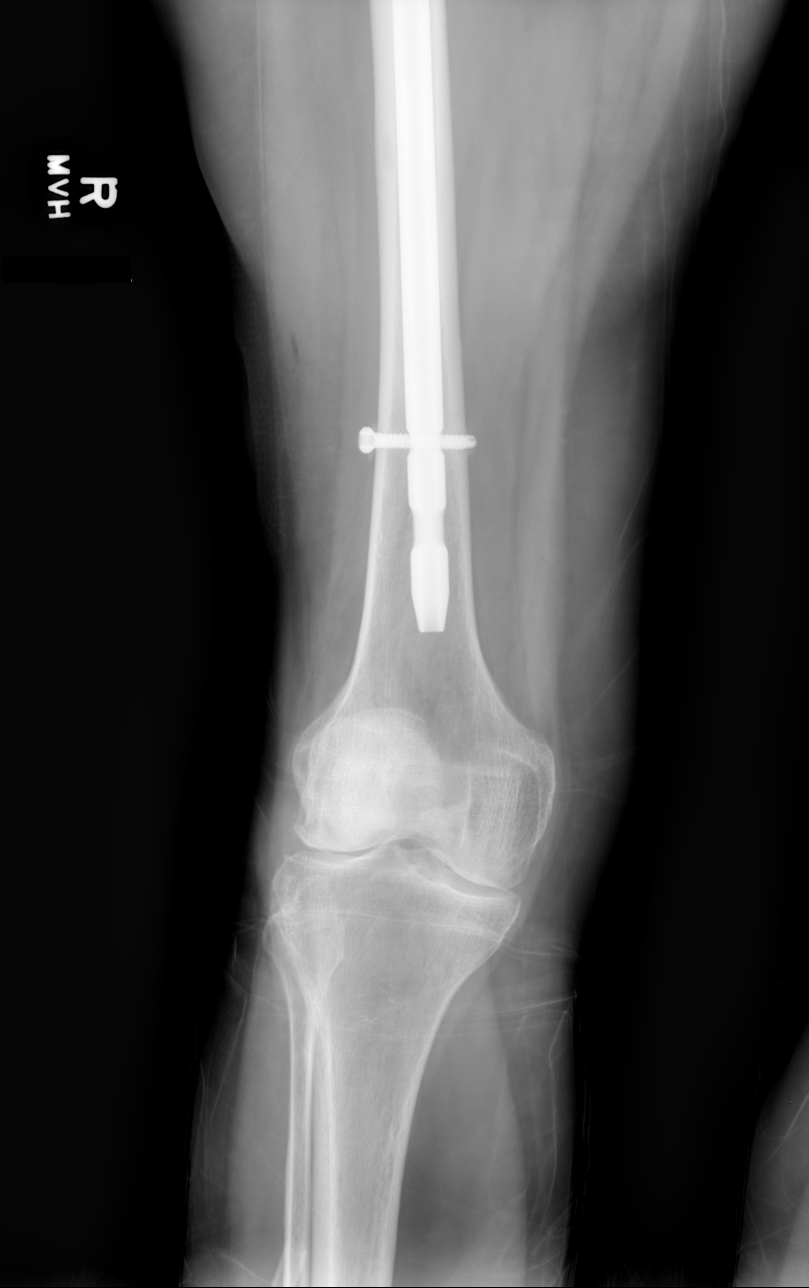
[im 3/4]
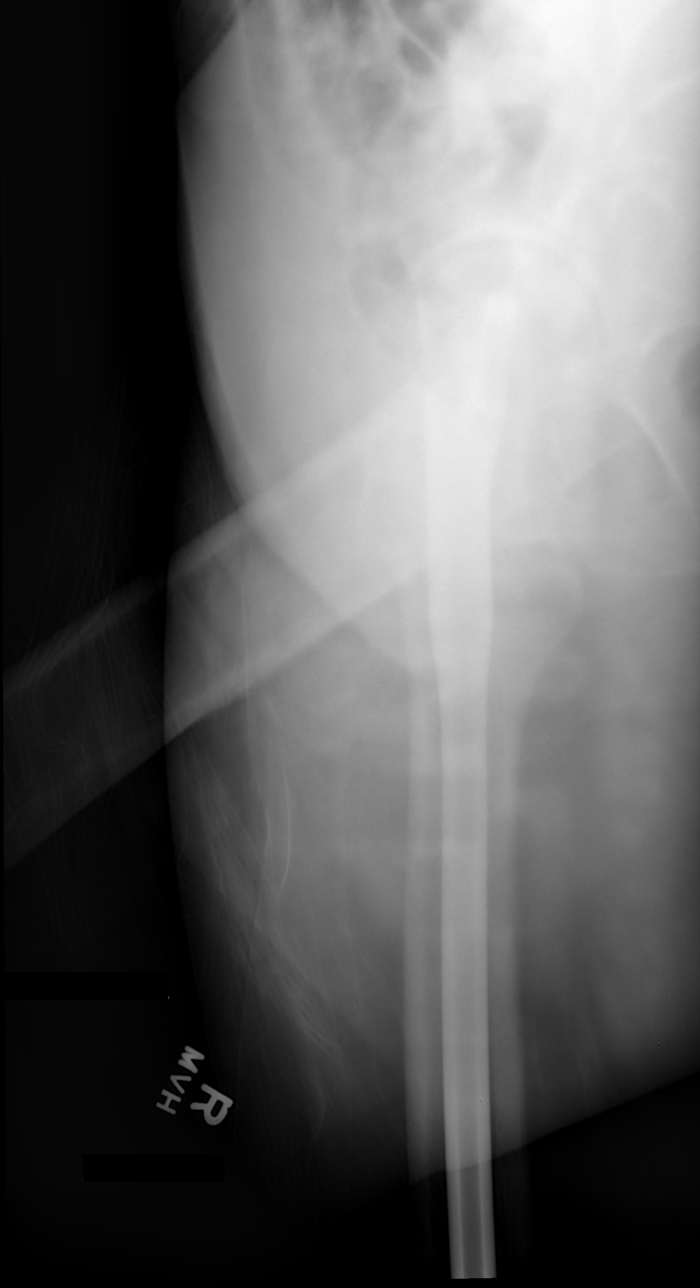
[im 4/4]
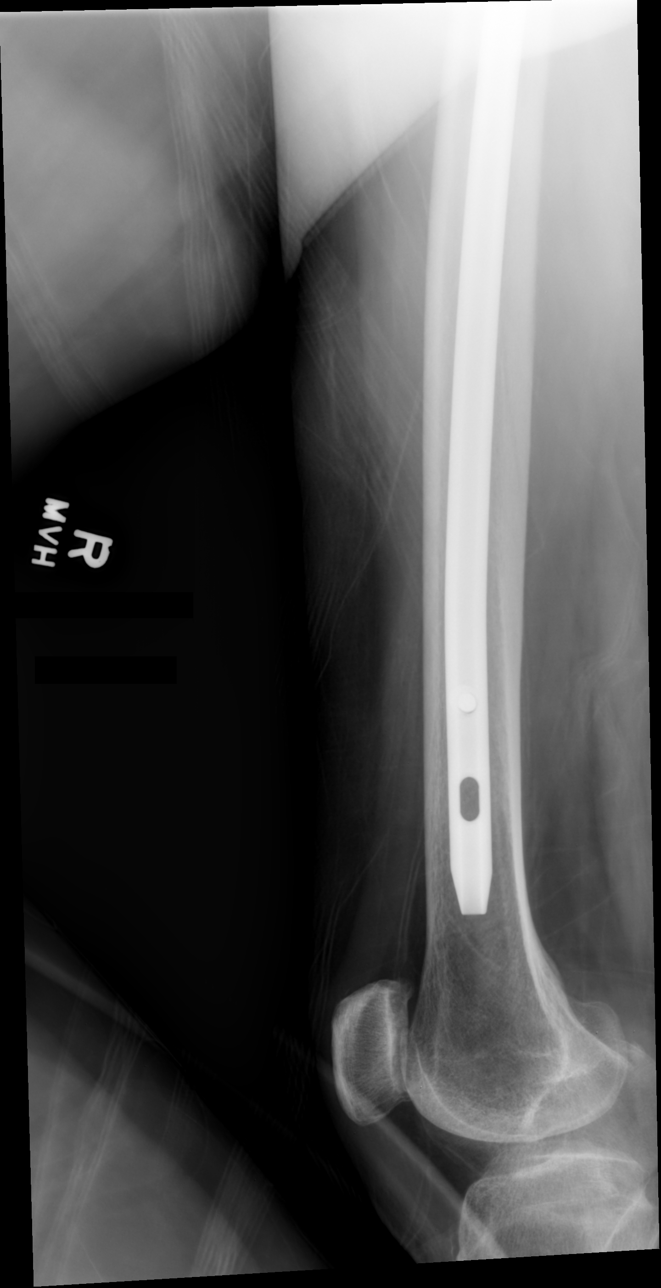

[4 of 4 positions shown; findings below may reference images not displayed]

FINDINGS: The patient has a new dynamic hip screw and long IM nail
with a single distal interlocking screw for fixation of an
intertrochanteric fracture.  Hardware is intact.  No acute fracture
is identified.  Position and alignment of the intertrochanteric
fracture are near anatomic.  Gas in the soft tissues from surgery
noted.
IMPRESSION: ORIF right intertrochanteric fracture without evidence of
complication.

## 2014-03-09 ENCOUNTER — Ambulatory Visit: Payer: BC Managed Care – PPO | Attending: Ophthalmology

## 2014-03-09 NOTE — Pre-Procedure Instructions (Addendum)
No pre ops required.will request surgeon h/p and consent   Phone: [phone]; Fax: [phone]

## 2014-03-16 ENCOUNTER — Other Ambulatory Visit (HOSPITAL_COMMUNITY): Payer: Self-pay | Admitting: Emergency Medicine

## 2014-03-16 DIAGNOSIS — R109 Unspecified abdominal pain: Secondary | ICD-10-CM

## 2014-03-16 DIAGNOSIS — R319 Hematuria, unspecified: Secondary | ICD-10-CM

## 2014-03-20 ENCOUNTER — Ambulatory Visit (HOSPITAL_COMMUNITY): Payer: BC Managed Care – PPO

## 2014-03-21 NOTE — Pre-Procedure Instructions (Signed)
Mitomycin orders scanned into epic and faxed to pharmacy.

## 2014-03-22 ENCOUNTER — Encounter
Admission: RE | Disposition: A | Discharge status: Home / Self Care | Payer: Self-pay | Source: Ambulatory Visit | Attending: Ophthalmology

## 2014-03-22 ENCOUNTER — Ambulatory Visit: Payer: BC Managed Care – PPO | Admitting: Physician Assistant

## 2014-03-22 ENCOUNTER — Ambulatory Visit
Admission: RE | Admit: 2014-03-22 | Discharge: 2014-03-22 | Disposition: A | Discharge status: Home / Self Care | Payer: BC Managed Care – PPO | Source: Ambulatory Visit | Attending: Ophthalmology | Admitting: Ophthalmology

## 2014-03-22 ENCOUNTER — Ambulatory Visit: Payer: BC Managed Care – PPO | Admitting: Ophthalmology

## 2014-03-22 DIAGNOSIS — F40240 Claustrophobia: Secondary | ICD-10-CM | POA: Insufficient documentation

## 2014-03-22 DIAGNOSIS — K219 Gastro-esophageal reflux disease without esophagitis: Secondary | ICD-10-CM | POA: Insufficient documentation

## 2014-03-22 DIAGNOSIS — Z87891 Personal history of nicotine dependence: Secondary | ICD-10-CM | POA: Insufficient documentation

## 2014-03-22 DIAGNOSIS — M722 Plantar fascial fibromatosis: Secondary | ICD-10-CM | POA: Insufficient documentation

## 2014-03-22 DIAGNOSIS — K5190 Ulcerative colitis, unspecified, without complications: Secondary | ICD-10-CM | POA: Insufficient documentation

## 2014-03-22 DIAGNOSIS — I10 Essential (primary) hypertension: Secondary | ICD-10-CM | POA: Insufficient documentation

## 2014-03-22 DIAGNOSIS — Z86718 Personal history of other venous thrombosis and embolism: Secondary | ICD-10-CM | POA: Insufficient documentation

## 2014-03-22 DIAGNOSIS — H401220 Low-tension glaucoma, left eye, stage unspecified: Secondary | ICD-10-CM | POA: Insufficient documentation

## 2014-03-22 DIAGNOSIS — Z91040 Latex allergy status: Secondary | ICD-10-CM | POA: Insufficient documentation

## 2014-03-22 HISTORY — PX: TRABECULECTOMY: SHX107

## 2014-03-22 SURGERY — TRABECULECTOMY
Anesthesia: Anesthesia General | Site: Eye | Laterality: Left | Wound class: Clean

## 2014-03-22 MED ORDER — FENTANYL CITRATE 0.05 MG/ML IJ SOLN
25.0000 ug | INTRAMUSCULAR | Status: DC | PRN
Start: 2014-03-22 — End: 2014-03-22

## 2014-03-22 MED ORDER — MITOMYCIN CHEMO INTRAOCULAR INJECTION 0.4 MG/ML
0.4000 mg | Freq: Once | Status: DC
Start: 2014-04-05 — End: 2014-03-22

## 2014-03-22 MED ORDER — BSS IO SOLN
INTRAOCULAR | Status: DC | PRN
Start: 2014-03-22 — End: 2014-03-22
  Administered 2014-03-22: 22 mL via TOPICAL

## 2014-03-22 MED ORDER — DEXAMETHASONE SODIUM PHOSPHATE 4 MG/ML IJ SOLN
INTRAMUSCULAR | Status: AC
Start: 2014-03-22 — End: ?
  Filled 2014-03-22: qty 1

## 2014-03-22 MED ORDER — PROPOFOL INFUSION 10 MG/ML
INTRAVENOUS | Status: DC | PRN
Start: 2014-03-22 — End: 2014-03-22
  Administered 2014-03-22: 200 mg via INTRAVENOUS

## 2014-03-22 MED ORDER — HYDROMORPHONE HCL 1 MG/ML IJ SOLN
0.5000 mg | INTRAMUSCULAR | Status: DC | PRN
Start: 2014-03-22 — End: 2014-03-22

## 2014-03-22 MED ORDER — ONDANSETRON HCL 4 MG/2ML IJ SOLN
4.0000 mg | Freq: Once | INTRAMUSCULAR | Status: DC | PRN
Start: 2014-03-22 — End: 2014-03-22

## 2014-03-22 MED ORDER — SODIUM HYALURONATE 10 MG/ML IO SOLN
INTRAOCULAR | Status: DC | PRN
Start: 2014-03-22 — End: 2014-03-22
  Administered 2014-03-22: .5 mL via INTRAOCULAR

## 2014-03-22 MED ORDER — ATROPINE SULFATE 1 % OP SOLN
OPHTHALMIC | Status: DC | PRN
Start: 2014-03-22 — End: 2014-03-22
  Administered 2014-03-22: 1 [drp]

## 2014-03-22 MED ORDER — ONDANSETRON HCL 4 MG/2ML IJ SOLN
INTRAMUSCULAR | Status: AC
Start: 2014-03-22 — End: ?
  Filled 2014-03-22: qty 2

## 2014-03-22 MED ORDER — FENTANYL CITRATE 0.05 MG/ML IJ SOLN
INTRAMUSCULAR | Status: DC | PRN
Start: 2014-03-22 — End: 2014-03-22
  Administered 2014-03-22: 50 ug via INTRAVENOUS
  Administered 2014-03-22: 25 ug via INTRAVENOUS

## 2014-03-22 MED ORDER — LIDOCAINE-EPINEPHRINE 2 %-1:200000 IJ SOLN
INTRAMUSCULAR | Status: DC | PRN
Start: 2014-03-22 — End: 2014-03-22
  Administered 2014-03-22: .1 mL

## 2014-03-22 MED ORDER — LACTATED RINGERS IV SOLN
INTRAVENOUS | Status: DC
Start: 2014-03-22 — End: 2014-03-22

## 2014-03-22 MED ORDER — STERILE WATER FOR IRRIGATION IR SOLN
Status: DC | PRN
Start: 2014-03-22 — End: 2014-03-22
  Administered 2014-03-22: 22 mL

## 2014-03-22 MED ORDER — OFLOXACIN 0.3 % OP SOLN
OPHTHALMIC | Status: DC | PRN
Start: 2014-03-22 — End: 2014-03-22
  Administered 2014-03-22: 1 [drp]

## 2014-03-22 MED ORDER — PREDNISOLONE ACETATE 1 % OP SUSP
OPHTHALMIC | Status: DC | PRN
Start: 2014-03-22 — End: 2014-03-22
  Administered 2014-03-22: 1 [drp]

## 2014-03-22 MED ORDER — BUPIVACAINE HCL (PF) 0.75 % IJ SOLN
INTRAMUSCULAR | Status: DC | PRN
Start: 2014-03-22 — End: 2014-03-22
  Administered 2014-03-22: .5 mL

## 2014-03-22 MED ORDER — CEFAZOLIN SODIUM 1 G IJ SOLR
INTRAMUSCULAR | Status: DC | PRN
Start: 2014-03-22 — End: 2014-03-22
  Administered 2014-03-22: 50 mg via SUBCONJUNCTIVAL

## 2014-03-22 MED ORDER — HYDROCODONE-ACETAMINOPHEN 5-325 MG PO TABS
1.0000 | ORAL_TABLET | Freq: Once | ORAL | Status: DC | PRN
Start: 2014-03-22 — End: 2014-03-22

## 2014-03-22 MED ORDER — MIDAZOLAM HCL 2 MG/2ML IJ SOLN
INTRAMUSCULAR | Status: AC
Start: 2014-03-22 — End: ?
  Filled 2014-03-22: qty 2

## 2014-03-22 MED ORDER — SODIUM CHLORIDE 0.9 % IV SOLN
INTRAVENOUS | Status: DC
Start: 2014-03-22 — End: 2014-03-22

## 2014-03-22 MED ORDER — MITOMYCIN CHEMO INTRAOCULAR INJECTION 0.4 MG/ML
0.4000 mg | Freq: Once | Status: AC
Start: 2014-03-22 — End: 2014-03-22
  Administered 2014-03-22: .1 mL via INTRAOCULAR
  Filled 2014-03-22: qty 10

## 2014-03-22 MED ORDER — ONDANSETRON HCL 4 MG/2ML IJ SOLN
INTRAMUSCULAR | Status: DC | PRN
Start: 2014-03-22 — End: 2014-03-22
  Administered 2014-03-22: 4 mg via INTRAVENOUS

## 2014-03-22 MED ORDER — MIDAZOLAM HCL 2 MG/2ML IJ SOLN
INTRAMUSCULAR | Status: DC | PRN
Start: 2014-03-22 — End: 2014-03-22
  Administered 2014-03-22: 2 mg via INTRAVENOUS

## 2014-03-22 MED ORDER — MEPERIDINE HCL 25 MG/ML IJ SOLN
25.0000 mg | INTRAMUSCULAR | Status: DC | PRN
Start: 2014-03-22 — End: 2014-03-22

## 2014-03-22 MED ORDER — LIDOCAINE HCL 2 % IJ SOLN
INTRAMUSCULAR | Status: DC | PRN
Start: 2014-03-22 — End: 2014-03-22
  Administered 2014-03-22: 60 mg

## 2014-03-22 MED ORDER — FENTANYL CITRATE 0.05 MG/ML IJ SOLN
INTRAMUSCULAR | Status: AC
Start: 2014-03-22 — End: ?
  Filled 2014-03-22: qty 2

## 2014-03-22 MED ORDER — DEXAMETHASONE SODIUM PHOSPHATE 4 MG/ML IJ SOLN (WRAP)
INTRAMUSCULAR | Status: DC | PRN
Start: 2014-03-22 — End: 2014-03-22
  Administered 2014-03-22: 4 mg via INTRAVENOUS

## 2014-03-22 MED ORDER — PROMETHAZINE HCL 25 MG/ML IJ SOLN
6.2500 mg | Freq: Once | INTRAMUSCULAR | Status: DC | PRN
Start: 2014-03-22 — End: 2014-03-22

## 2014-03-22 SURGICAL SUPPLY — 46 items
ANTERIOR CANNULA (Opthamology Supply) ×6 IMPLANT
APPLICATOR COTTON TIP STRL 6IN (Prep) ×3 IMPLANT
CAUTERY 25GA (Cautery) IMPLANT
CLOSURE STERI-STRIP 1X5IN (Dressing) ×3 IMPLANT
CLOTH BEACON TIMEOUT ORANGE (Other) ×3 IMPLANT
CORD BIPOLAR STRL DISP (Cautery) ×3 IMPLANT
COVER HEAD MICROSCOPE (Drape) ×1
DRAPE 225MM HEAD COVER EQUIPMENT BIOM STERILE DISPOSABLE (Drape) ×1 IMPLANT
DRAPE EQP 225MM BIOM STRL HD CVR DISP (Drape) ×2
ERASER HEMOSTATIC 18G (Cautery) ×1
EYE PAD CURITY 1-5/8X2-5/8IN (Dressing) ×3 IMPLANT
EYE SHIELD SOFT BLUE RUBBER (Personal Protection) ×2
GLOVE SENSICARE 6.5 (Glove) ×1
GLOVE SRG PLISPRN ALOE 6.5 SNSCR 12IN LF (Glove) ×2
GLOVE SURGICAL 6.5 SENSICARE SMOOTH BEAD CUFF POLYISOPRENE L12 IN (Glove) ×1 IMPLANT
GOWN SMART IMPERVIOUS LARGE (Gown) ×3 IMPLANT
KIT INFECTION CONTROL CUSTOM (Kits) ×3
KIT INFECTION CONTROL CUSTOM IFOH03 (Kits) ×1 IMPLANT
LABEL MED LF STRL PK CSTM RTNA DISP (Other) ×1
LABEL MEDICAL PACK CUSTOM RETINA (Other) ×1
LABEL MEDICAL PACK CUSTOM RETINA CL22746 CUSTOM MED LABEL PACK (Other) ×1 IMPLANT
LABEL SHEET CUSTOM RETINA (Other) ×1
NEEDLE HPO PP RW PRCSNGL 30GA .5IN LF (Needles) ×4 IMPLANT
NEEDLE YALE REG-BEV 30G DISP (Needles) ×2
PROBE HEMOSTATIC ERASER STRAIGHT OD18 GA BIPOLAR HOLLOW CORE TARGET (Cautery) ×1 IMPLANT
PROBE HMERSR STRG WTFLD ERS 18GA BP HLW (Cautery) ×2
SHIELD OPHTHALMIC UNIVERSAL FLEXIBLE (Personal Protection) ×1
SHIELD OPHTHALMIC UNIVERSAL FLEXIBLE EDGE SOFGUARD THERMOPLASTIC (Personal Protection) ×1 IMPLANT
SOLUTION BSS IRR STRL 15ML (Opthamology Supply) ×2
SOLUTION IRRIGATION 0.9% BALANCED SALT (Opthamology Supply) ×1
SOLUTION IRRIGATION 0.9% BALANCED SALT 15 ML (Opthamology Supply) ×1 IMPLANT
SPEAR EYE SURGICAL (Sponge) ×6 IMPLANT
SUTURE ABS 10-0 CS160-6 VCL 12IN 2 ARM (Suture) ×1
SUTURE ABS 7-0 TG140-8 VCL MICROPOINT 18 (Suture) ×1
SUTURE COATED VICRYL 7-0 TG140-8 L18 IN (Suture) ×1
SUTURE COATED VICRYL 7-0 TG140-8 L18 IN 2 ARM BRD COATED VLT ABSRBBL (Suture) ×1 IMPLANT
SUTURE ETHILON 10-0 TG16 12IN (Suture) ×3 IMPLANT
SUTURE VICRYL 10-0 CS160-6 L12 IN 2 ARM (Suture) ×1
SUTURE VICRYL 10-0 CS160-6 L12 IN 2 ARM MONOFILAMENT COATED VIOLET (Suture) ×1 IMPLANT
SUTURE VICRYL 10-0 TG1606 12IN (Suture) ×1
SUTURE VICRYL 7-0 TG1408 18IN (Suture) ×1
SUTURE VICRYL 8-0 9IN V130-4 (Suture) IMPLANT
SYRINGE 1CC COLMN LUER LOCK (Syringes, Needles) ×6 IMPLANT
WATER STERILE PLASTIC POUR BOTTLE 250 ML (Irrigation Solutions) ×1 IMPLANT
WATER STRL 250ML LF PLS PR BTL (Irrigation Solutions) ×1
WATER STRL IRRIG 250ML BTL (Irrigation Solutions) ×1

## 2014-03-22 NOTE — Transfer of Care (Signed)
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

## 2014-03-22 NOTE — Op Note (Signed)
FULL OPERATIVE NOTE    Date Time: 03/22/2014 12:10 PM  Patient Name: [name]  Attending Physician: Noah Charon Madiline Saffran      Date of Operation:   03/22/2014      Providers Performing:   Surgeon(s):  Humberto Seals M Laiken Sandy    Assistant (s): none    Operative Procedure:   Procedure(s):  TRABECULECTOMY Revision with Lidocaine/Epi : [facility]MC (0.4mg /cc)  (1:1) Left Eye    Preoperative Diagnosis:   Pre-Op Diagnosis Codes:     * Primary open-angle glaucoma [365.11, 365.70]    Postoperative Diagnosis:   glaucoma    Indications:   Uncontrolled IOP     Operative Notes:   After appropriate informed consent was obtained, the patient was taken to the operating room, placed in supine position.  The Left eye was prepped and draped in usual sterile fashion for ophthalmic  surgery. A lid speculum was placed. A 7-0 vicryl clear corneal traction suture was placed superiorly. Mitomycin C 0.4mg /cc in a 1:1 mixture with Lidocaine with Epinephrine was given 9 mm posterior to the limbus with 30G needle in subconjunctival/subtenon's space. Paracentesis was created with a super sharp blade.  27G needle was used to needle fibrosis around bleb and enter into anterior chamber.  The scleral flap was noted to be firmly fibrosed.  Provisc was administered to deepen the anterior chamber.  The 27G needle on BSS was passed through the paracentesis wound and used to un-roof the scleral flap through the sclerotomy site.  The Provisc was irrigated from the anterior chamber with BSS and nice, robust elevation of the bleb was noted.   Subconjunctival injection of Ancef, Decadron. Marcain 0.75% and Provisc was given. The conjunctiva was reapproximated with 8-0 vicryl. The paracentesis wound was stromally hydrated and noted to be water tight.  A drop of ofloxacin, prednisolone, Atropine and antibiotic ointment followed by shield.  Patient was escorted to the recovery room in stable condition.     Estimated Blood Loss:        Specimens:       Complications:   * No  complications entered in OR log *      Signed by Noah Charon Branae Crail

## 2014-03-22 NOTE — Addendum Note (Signed)
Addendum  created 03/22/14 1521 by Wandra Scot, CRNA    Modules edited: Anesthesia Medication Administration

## 2014-03-22 NOTE — Anesthesia Postprocedure Evaluation (Signed)
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

## 2014-03-22 NOTE — Progress Notes (Signed)
Awake,denies pain,tolerating sips of juice.  Written discharge instructions given to patient & accompanying responsible adult stated understanding

## 2014-03-22 NOTE — H&P (Signed)
HISTORY AND PHYSICAL      Today's Date: 03/22/2014   Patient Name: [name]  Attending Physician: Pecola Leisure, MD    Chief Complaint: Uncontrolled Glaucoma Left Eye - Plan for Trabeculectomy Revision with [facility] Left Eye     History of Present Illness:   [name] is a 53 y.o. female who  has a past medical history of Seasonal allergies; GERD (gastroesophageal reflux disease); Hypertensive disorder; Chronic ulcerative colitis; Clotting disorder; Deep venous thrombosis of calf (2012); Glaucoma NEC; Ovarian cyst, right; Plantar fasciitis; and Claustrophobia..  This patient presents to the hospital today to undergo elective surgery due to  Uncontrolled Glaucoma Left Eye - Plan for Trabeculectomy Revision with [facility] Left Eye     Past Medical History:     Past Medical History   Diagnosis Date   . Seasonal allergies    . GERD (gastroesophageal reflux disease)    . Hypertensive disorder      on meds   . Chronic ulcerative colitis      flare ups/last remicade 12/31/11   . Clotting disorder      DVT LLE 2012   . Deep venous thrombosis of calf 2012     LLL/on xeralto//   . Glaucoma NEC      bilat   . Ovarian cyst, right    . Plantar fasciitis    . Claustrophobia        Past Surgical History:     Past Surgical History   Procedure Laterality Date   . Tonsillectomy     . Ureterectomy left     . Bunionectomy       left foot   . Hysterectomy       removed both ovaries and tubes   . Revision of hysterectomyl suture line     . Eye surgery Bilateral 2013     laser surgery for glaucoma, bilateral trabeculectomy       Family History:     Family History   Problem Relation Age of Onset   . Hypertension Father    . Vision loss Father    . Vision loss Brother    . Hypertension Paternal Aunt    . Vision loss Paternal Uncle        Social History:     History     Social History   . Marital Status: Married     Spouse Name: N/A     Number of Children: N/A   . Years of Education: N/A     Social History Main Topics   . Smoking  status: Former Smoker     Quit date: 12/21/2010   . Smokeless tobacco: Never Used   . Alcohol Use: 1.2 oz/week     0 Glasses of wine, 2 Cans of beer, 0 Shots of liquor, 0 Not specified per week   . Drug Use: No   . Sexual Activity:     Partners: Male     Birth Control/ Protection: None     Other Topics Concern   . Not on file     Social History Narrative       Allergies:     Allergies   Allergen Reactions   . Latex Itching   . Azopt [Brinzolamide] Other (See Comments)     Not sure of reaction   . Brimonidine Tartrate Other (See Comments)     Eyes get red       Medications:     Prior to Admission  medications    Medication Sig Start Date End Date Taking? Authorizing Provider   acetaminophen (TYLENOL) 500 MG tablet Take 1,000 mg by mouth.   Yes [provider]   Artificial Tear Ointment (ARTIFICIAL TEARS) ointment Place 2 application into both eyes as needed.   Yes [provider]   aspirin 81 MG EC tablet Take 81 mg by mouth daily.   Yes [provider]   brimonidine (ALPHAGAN P) 0.1 % Solution 1 drop 2 (two) times daily. Left eye   Yes [provider]   Cholecalciferol (VITAMIN D PO) Take 1 capsule by mouth daily.   Yes [provider]   desloratadine (CLARINEX) 5 MG tablet Take 5 mg by mouth daily.   Yes [provider]   esomeprazole (NEXIUM) 20 MG capsule Take 20 mg by mouth every morning before breakfast.   Yes [provider]   estradiol (ESTRACE) 0.1 MG/GM vaginal cream Place 2 g vaginally twice a week.   Yes [provider]   InFLIXimab (REMICADE IV) Inject into the vein. Every three months last dose 02/18/12   Yes [provider]   metoprolol (LOPRESSOR) 100 MG tablet Take 100 mg by mouth daily.   Yes [provider]   omeprazole-sodium bicarbonate (ZEGERID) 40-1100 MG per capsule Take 1 capsule by mouth every morning before breakfast.   Yes [provider]   PROCTOZONE-HC 2.5 % rectal cream  04/17/12  Yes  [provider]   timolol (TIMOPTIC) 0.25 % ophthalmic solution Place 1 drop into the left eye daily.   Yes [provider]   fluconazole (DIFLUCAN) 150 MG tablet  01/23/14   [provider]   nystatin-triamcinolone (MYCOLOG II) cream  01/23/14   [provider]   timolol (TIMOPTIC) 0.5 % ophthalmic solution  12/26/13   [provider]   TIMOPTIC OCUDOSE 0.5 % Solution  02/04/14   [provider]       Review of Systems:   As per the HPI.  The patient denies any additional changes to their otic, opthalmologic, dermatologic, pulmonary, cardiac, gastrointestinal, genitourinary, musculoskeletal, hematologic, constitutional, or psychiatric systems.    Physical Exam:     Filed Vitals:    03/22/14 0858   BP: 165/101   Pulse: 63   Temp: 97.1 F (36.2 C)   Resp: 16   SpO2: 100%     General appearance - alert, well appearing, and in no distress  Chest - clear to auscultation, no wheezes, rales or rhonchi, symmetric air entry  Heart - normal rate, regular rhythm, normal S1, S2, no murmurs, rubs, clicks or gallops  Abdomen - soft, nontender, nondistended, no masses or organomegaly  Extremities - peripheral pulses normal, no pedal edema, no clubbing or cyanosis    Labs:     Results     ** No results found for the last 24 hours. **          Rads:     Radiology Results (24 Hour)     ** No results found for the last 24 hours. **          Impression:     Patient Active Problem List   Diagnosis   . Low tension open-angle glaucoma       Plan:    Uncontrolled Glaucoma Left Eye - Plan for Trabeculectomy Revision with [facility] Left Eye       Signed by: Pecola Leisure, MD

## 2014-03-22 NOTE — Anesthesia Preprocedure Evaluation (Signed)
Anesthesia Evaluation    AIRWAY    Mallampati: II    TM distance: >3 FB  Neck ROM: full  Mouth Opening:full   CARDIOVASCULAR    cardiovascular exam normal       DENTAL    no notable dental hx       PULMONARY    pulmonary exam normal     OTHER FINDINGS    Pt id and h&P reviewed.                  Anesthesia Plan    ASA 2     general               (Anesthesia plan described,risks and benefits explained. Patient agrees with the plan.)      intravenous induction   Detailed anesthesia plan: general LMA  Monitors/Adjuncts: other    Post Op: other  Post op pain management: per surgeon    informed consent obtained    Plan discussed with CRNA.

## 2014-03-22 NOTE — Discharge Instructions (Signed)
Discharge Instructions for Glaucoma Patients     Rest today -You may experience dizziness or sleepiness.  Do not drive home. You should not operate a motor vehicle, machinery or power tools for at least 24 hours. DO NOT stay alone for 24 hours.  Stop using all glaucoma eye drops and eye medications that you used before surgery in the operated eye.  Continue using all drops that you were using in the un-operated eye before surgery and discontinue any oral medications prescribed for lowering your eye pressure (Acetazolamide, Methazolamide, Diamox or Neptazane ).  Remove the patch when you wake up tomorrow morning unless otherwise instructed by your Doctor.  After removing the patch, place one drop of the antibiotic drop (tan top) in the operated eye followed by the Prednisolone Acetate eye drop (Pink Top) . If you were prescribed the Atropine drop (Red Top) with you, use that as well. Please remember to wait 5 minutes between each of these drops.  Use Acetaminophen (Tylenol) for pain if needed. Take 2 tablets (325mg or 500mg) every 6 hours as needed.   If you have severe pain that is not relieved with Acetaminophen, please call the doctor.  Please refrain from any exertion, including bending, straining, lifting greater than 10 pounds, hair salons, dental procedures and sexual activity for at least 3 weeks or as otherwise instructed by the Doctor.    The following are common symptoms after surgery:  ? Feeling as if something is in your eye  ? Blurred vision  ? Redness of the eye  ? Bruising of the skin of the eyelid.  ? A "bubble"or clear area at the top of the colored part of eye (For glaucoma surgery)  Please bring all eye drops and eye shield with you to your post-op appointment.    If you have a question or an emergency, call our office at:  ? During Office Hours (8:30 AM - 5PM): [phone]   ? After Hours: [phone] or [phone] (if no response by answering service within 30 minutes)      Post Anesthesia  Discharge Instructions    Although you may be awake and alert in the recovery room, small amounts of anesthetic remain in your system for about 24 hours.  You may feel tired and sleepy during this time.      You are advised to go directly home from the hospital.    Plan to stay at home and rest for the remainder of the day.    It is advisable to have someone with you at home for 24 hours after surgery.    Do not operate a motor vehicle, or any mechanical or electrical equipment for the next 24 hours.      Be careful when you are walking around, you may become dizzy.  The effects of anesthesia and/or medications are still present and drowsiness may occur    Do not consume alcohol, tranquilizers, sleeping medications, or any other non prescribed medication for the remainder of the day.    Diet:  begin with liquids, progress your diet as tolerated or as directed by your surgeon.  Nausea and vomiting may occur in the next 24 hours.

## 2014-03-23 ENCOUNTER — Encounter: Payer: Self-pay | Admitting: Ophthalmology

## 2015-01-11 ENCOUNTER — Encounter (HOSPITAL_COMMUNITY): Payer: Self-pay | Admitting: Emergency Medicine

## 2015-01-11 ENCOUNTER — Emergency Department (HOSPITAL_COMMUNITY): Payer: BLUE CROSS/BLUE SHIELD

## 2015-01-11 ENCOUNTER — Emergency Department (HOSPITAL_COMMUNITY)
Admission: EM | Admit: 2015-01-11 | Discharge: 2015-01-11 | Disposition: A | Discharge status: Home / Self Care | Payer: BLUE CROSS/BLUE SHIELD | Attending: Emergency Medicine | Admitting: Emergency Medicine

## 2015-01-11 DIAGNOSIS — Z79899 Other long term (current) drug therapy: Secondary | ICD-10-CM | POA: Diagnosis not present

## 2015-01-11 DIAGNOSIS — Z7982 Long term (current) use of aspirin: Secondary | ICD-10-CM | POA: Diagnosis not present

## 2015-01-11 DIAGNOSIS — R109 Unspecified abdominal pain: Secondary | ICD-10-CM | POA: Diagnosis present

## 2015-01-11 DIAGNOSIS — N200 Calculus of kidney: Secondary | ICD-10-CM | POA: Diagnosis not present

## 2015-01-11 LAB — CBC WITH DIFFERENTIAL/PLATELET
Basophils Absolute: 0 10*3/uL (ref 0.0–0.1)
Basophils Relative: 0 % (ref 0–1)
Eosinophils Absolute: 0.1 10*3/uL (ref 0.0–0.7)
Eosinophils Relative: 1 % (ref 0–5)
HCT: 40.7 % (ref 36.0–46.0)
Hemoglobin: 13.4 g/dL (ref 12.0–15.0)
Lymphocytes Relative: 16 % (ref 12–46)
Lymphs Abs: 1.3 10*3/uL (ref 0.7–4.0)
MCH: 31.4 pg (ref 26.0–34.0)
MCHC: 32.9 g/dL (ref 30.0–36.0)
MCV: 95.3 fL (ref 78.0–100.0)
Monocytes Absolute: 0.4 10*3/uL (ref 0.1–1.0)
Monocytes Relative: 5 % (ref 3–12)
Neutro Abs: 6.7 10*3/uL (ref 1.7–7.7)
Neutrophils Relative %: 78 % — ABNORMAL HIGH (ref 43–77)
Platelets: 314 10*3/uL (ref 150–400)
RBC: 4.27 MIL/uL (ref 3.87–5.11)
RDW: 13.1 % (ref 11.5–15.5)
WBC: 8.6 10*3/uL (ref 4.0–10.5)

## 2015-01-11 LAB — COMPREHENSIVE METABOLIC PANEL
ALBUMIN: 3.9 g/dL (ref 3.5–5.0)
ALT: 13 U/L — ABNORMAL LOW (ref 14–54)
ANION GAP: 8 (ref 5–15)
AST: 20 U/L (ref 15–41)
Alkaline Phosphatase: 90 U/L (ref 38–126)
BUN: 21 mg/dL — ABNORMAL HIGH (ref 6–20)
CO2: 23 mmol/L (ref 22–32)
Calcium: 9 mg/dL (ref 8.9–10.3)
Chloride: 104 mmol/L (ref 101–111)
Creatinine, Ser: 0.78 mg/dL (ref 0.44–1.00)
Glucose, Bld: 127 mg/dL — ABNORMAL HIGH (ref 65–99)
Potassium: 3.2 mmol/L — ABNORMAL LOW (ref 3.5–5.1)
SODIUM: 135 mmol/L (ref 135–145)
TOTAL PROTEIN: 7.4 g/dL (ref 6.5–8.1)
Total Bilirubin: 0.2 mg/dL — ABNORMAL LOW (ref 0.3–1.2)

## 2015-01-11 LAB — URINALYSIS, ROUTINE W REFLEX MICROSCOPIC
Bilirubin Urine: NEGATIVE
Glucose, UA: NEGATIVE mg/dL
Hgb urine dipstick: NEGATIVE
KETONES UR: NEGATIVE mg/dL
LEUKOCYTES UA: NEGATIVE
NITRITE: NEGATIVE
PH: 5.5 (ref 5.0–8.0)
PROTEIN: NEGATIVE mg/dL
SPECIFIC GRAVITY, URINE: 1.02 (ref 1.005–1.030)
UROBILINOGEN UA: 0.2 mg/dL (ref 0.0–1.0)

## 2015-01-11 MED ORDER — KETOROLAC TROMETHAMINE 30 MG/ML IJ SOLN
30.0000 mg | Freq: Once | INTRAMUSCULAR | Status: AC
  Administered 2015-01-11: 30 mg via INTRAVENOUS
  Filled 2015-01-11: qty 1

## 2015-01-11 MED ORDER — ONDANSETRON HCL 4 MG/2ML IJ SOLN
4.0000 mg | Freq: Once | INTRAMUSCULAR | Status: AC
  Administered 2015-01-11: 4 mg via INTRAVENOUS
  Filled 2015-01-11: qty 2

## 2015-01-11 MED ORDER — OXYCODONE-ACETAMINOPHEN 5-325 MG PO TABS: 1.0000 | ORAL_TABLET | Freq: Four times a day (QID) | ORAL | Status: AC | PRN

## 2015-01-11 MED ORDER — TAMSULOSIN HCL 0.4 MG PO CAPS: 0.4000 mg | ORAL_CAPSULE | Freq: Every day | ORAL | Status: AC

## 2015-01-11 MED ORDER — HYDROMORPHONE HCL 1 MG/ML IJ SOLN
1.0000 mg | Freq: Once | INTRAMUSCULAR | Status: AC
  Administered 2015-01-11: 1 mg via INTRAVENOUS
  Filled 2015-01-11: qty 1

## 2015-01-11 MED ORDER — ONDANSETRON HCL 4 MG PO TABS: 4.0000 mg | ORAL_TABLET | Freq: Four times a day (QID) | ORAL | Status: DC

## 2015-01-11 NOTE — Discharge Instructions (Signed)
Follow up with [facility] next week °

## 2015-01-11 NOTE — ED Provider Notes (Signed)
CSN: [id number]     Arrival date & time 01/11/15  4098 History   First MD Initiated Contact with Patient 01/11/15 0740     Chief Complaint  Patient presents with  . Flank Pain     (Consider location/radiation/quality/duration/timing/severity/associated sxs/prior Treatment) Patient is a 54 y.o. female presenting with flank pain. The history is provided by the patient (the pt complains of right flank pain since yesterday).  Flank Pain This is a new problem. The current episode started 12 to 24 hours ago. The problem occurs constantly. The problem has not changed since onset.Associated symptoms include abdominal pain. Pertinent negatives include no chest pain and no headaches. Nothing aggravates the symptoms. Nothing relieves the symptoms.    History reviewed. No pertinent past medical history. Past Surgical History  Procedure Laterality Date  . Femur im nail  05/15/2012    Procedure: INTRAMEDULLARY (IM) NAIL FEMORAL;  Surgeon: Mable Paris, MD;  Location: [facility];  Service: Orthopedics;  Laterality: Right;   History reviewed. No pertinent family history. History  Substance Use Topics  . Smoking status: Never Smoker   . Smokeless tobacco: Not on file  . Alcohol Use: No   OB History    No data available     Review of Systems  Constitutional: Negative for appetite change and fatigue.  HENT: Negative for congestion, ear discharge and sinus pressure.   Eyes: Negative for discharge.  Respiratory: Negative for cough.   Cardiovascular: Negative for chest pain.  Gastrointestinal: Positive for abdominal pain. Negative for diarrhea.  Genitourinary: Positive for flank pain. Negative for frequency and hematuria.  Musculoskeletal: Negative for back pain.  Skin: Negative for rash.  Neurological: Negative for seizures and headaches.  Psychiatric/Behavioral: Negative for hallucinations.      Allergies  Review of patient's allergies indicates no known allergies.  Home  Medications   Prior to Admission medications   Medication Sig Start Date End Date Taking? Authorizing Provider  Glucosamine 500 MG CAPS Take 1,000 mg by mouth daily.    Yes Historical Provider, MD  ibuprofen (ADVIL,MOTRIN) 200 MG tablet Take 400 mg by mouth every 6 (six) hours as needed for headache, mild pain or moderate pain.    Yes Historical Provider, MD  aspirin 325 MG EC tablet Take 1 tablet (325 mg total) by mouth 2 (two) times daily. Patient not taking: Reported on 01/11/2015 05/16/12   Jones Broom, MD  calcium-vitamin D (OSCAL WITH D) 500-200 MG-UNIT per tablet Take 2 tablets by mouth 2 (two) times daily. Patient not taking: Reported on 01/11/2015 05/17/12   Vassie Loll, MD  docusate sodium 100 MG CAPS Take 100 mg by mouth 2 (two) times daily. Hold for diarrhea Patient not taking: Reported on 01/11/2015 05/17/12   Vassie Loll, MD  ferrous fumarate (HEMOCYTE - 106 MG FE) 325 (106 FE) MG TABS Take 1 tablet (106 mg of iron total) by mouth 2 (two) times daily. Patient not taking: Reported on 01/11/2015 05/17/12   Vassie Loll, MD  HYDROcodone-acetaminophen (NORCO/VICODIN) 5-325 MG per tablet Take 1-2 tablets by mouth every 4 (four) hours as needed. Patient not taking: Reported on 01/11/2015 05/16/12   Jones Broom, MD  omeprazole (PRILOSEC) 40 MG capsule Take 1 capsule (40 mg total) by mouth daily. Patient not taking: Reported on 01/11/2015 05/17/12   Vassie Loll, MD  ondansetron (ZOFRAN) 4 MG tablet Take 1 tablet (4 mg total) by mouth every 6 (six) hours. 01/11/15   Bethann Berkshire, MD  oxyCODONE-acetaminophen (PERCOCET) 5-325 MG per tablet  Take 1 tablet by mouth every 6 (six) hours as needed. 01/11/15   Bethann Berkshire, MD  tamsulosin (FLOMAX) 0.4 MG CAPS capsule Take 1 capsule (0.4 mg total) by mouth daily. 01/11/15   Bethann Berkshire, MD   BP 166/87 mmHg  Pulse 88  Temp(Src) 97.9 F (36.6 C) (Oral)  Resp 16  SpO2 100% Physical Exam  Constitutional: She is oriented to person,  place, and time. She appears well-developed.  HENT:  Head: Normocephalic.  Eyes: Conjunctivae and EOM are normal. No scleral icterus.  Neck: Neck supple. No thyromegaly present.  Cardiovascular: Normal rate and regular rhythm.  Exam reveals no gallop and no friction rub.   No murmur heard. Pulmonary/Chest: No stridor. She has no wheezes. She has no rales. She exhibits no tenderness.  Abdominal: She exhibits no distension. There is no tenderness. There is no rebound.  Genitourinary:  Tender right flank   Musculoskeletal: Normal range of motion. She exhibits no edema.  Lymphadenopathy:    She has no cervical adenopathy.  Neurological: She is oriented to person, place, and time. She exhibits normal muscle tone. Coordination normal.  Skin: No rash noted. No erythema.  Psychiatric: She has a normal mood and affect. Her behavior is normal.    ED Course  Procedures (including critical care time) Labs Review Labs Reviewed  CBC WITH DIFFERENTIAL/PLATELET - Abnormal; Notable for the following:    Neutrophils Relative % 78 (*)    All other components within normal limits  COMPREHENSIVE METABOLIC PANEL - Abnormal; Notable for the following:    Potassium 3.2 (*)    Glucose, Bld 127 (*)    BUN 21 (*)    ALT 13 (*)    Total Bilirubin 0.2 (*)    All other components within normal limits  URINALYSIS, ROUTINE W REFLEX MICROSCOPIC (NOT AT [facility])    Imaging Review Ct Renal Stone Study  01/11/2015   CLINICAL DATA:  Kidney stone? Right flank pain and dysuria onset this morning.  EXAM: CT ABDOMEN AND PELVIS WITHOUT CONTRAST  TECHNIQUE: Multidetector CT imaging of the abdomen and pelvis was performed following the standard protocol without IV contrast.  COMPARISON:  None.  FINDINGS: There is a 3 mm stone at the right ureterovesical junction causing mild right-sided hydronephrosis. There is associated periureteral inflammatory fluid stranding about the proximal right ureter. Left kidney appears normal  without stone or hydronephrosis.  Liver, spleen, pancreas, gallbladder, and adrenal glands appear normal. Bowel is normal in caliber. No bowel wall thickening or evidence of bowel wall inflammation seen. Appendix is normal. No free fluid or abscess collection. No free intraperitoneal air. No enlarged lymph nodes seen. Adnexal regions are unremarkable.  Abdominal aorta is normal in caliber. Lung bases are clear. There is mild degenerative change within the lumbar spine but no acute osseous abnormality. Right hip fixation screw in place. Superficial soft tissues are unremarkable.  IMPRESSION: 3 mm stone at the right UVJ causing mild right-sided hydronephrosis.   Electronically Signed   By: Bary Richard M.D.   On: 01/11/2015 09:04     EKG Interpretation None      MDM   Final diagnoses:  Right flank pain  Kidney stone    Kidney stone,  rx with percocet, zofran, flomax and follow up [facility] next week.    Bethann Berkshire, MD 01/11/15 [phone]

## 2015-01-11 NOTE — ED Notes (Signed)
Patient was educated not to drive, operate heavy machinery, or drink alcohol while taking narcotic medication.  

## 2015-01-11 NOTE — ED Notes (Signed)
Pt c/o right flank pain and dysuria onset this morning. Denies n/v/diarrhea. Pt states that on Sunday she had nausea and right flank pain, which resolved until today. Pt denies trauma.

## 2015-03-19 NOTE — PT Eval Note (Signed)
[facility]     Physical Therapy Pelvic Floor Evaluation     Patient: [name]    MRN#: [id number]     Date of evaluation: 03/21/2015    Time Calculations:  1415 to 15:15    Consult received for [name] for PT Evaluation and Treatment.  Patient's medical condition is appropriate for Physical therapy intervention at this time. PT is agreeable to physical therapy intervention at this time.     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 She is able to achieve partial penetration but it is very painful 8-9/10 and she often has bleeding. She has been using Estrace cream but this has not helped. She uses Astroglide for lubrication during intercourse but it does not help much. She has tried Replens and another lubricant which also did not provide relief.       Medications:  Estrace cream 3x/week  Other meds listed on pt medication form      PMHx/PSHx:  Obtained from chart and reviewed with pt  Robot-assisted TLHRSO, L partial salpingectomy 03/11/2012  05/30/2012 - repair of dehiscence of vaginal cuff  Trabeculectomy revision L eye 03/22/2014  Ulcerative colitis/ Crohn's disease?  HTN  H/o DVT  Childbearing hx: none    Subjective:   Pain: 8-9/10 during intercourse and lasting for about 1 hr after intercours  Bladder Habits: WNL per pt     Urine incontinence episodes:denies  Pad Use: no    Bowel Habits: colitis - c/o lots  of bowel frequency     Straining at stool?: no  Bowel accidents: denies  Kegels: not familiar with them    Objective:   General: well developed lady, no outwardly visible impairments; ROM and mobility WNL    Informed consent obtained. Risks and benefits reviewed with pt.  Exam:Vaginal  Observation: Small reddened area, approx 2-3 cm, on R side of vulva - possibly some chaffing  Sensation: intact to touch  Palpation: very tight and tender R>L at [facility] and iliococcygeus  MMT: 3/5   Cough: negative for leak, negative for prolapse  Other: no external trigger points in hip musculature      Treatment today:  PT evaluation  Manual therapy to pelvic floor muscles utilizing gentle trigger point techniques  Provided information on ordering vaginal dilators  Provided list of vaginal lubricants  Educated the patient to role of physical therapy, plan of care and goals of therapy.     Assessment:   This is a 54 y o lady presenting with c/o painful intercourse following surgery for TLHBSO in 2013. On exam, pt demonstrates significant pelvic floor muscle spasm and muscle guarding with exam. Pt would benefit from skilled PT for manual therapy to reduce muscle spasm and pelvic floor muscle reeducation to reduce muscle guarding with penetration. Pt would benefit from instruction in use of vaginal dilators to help achieve these objectives. Pt seems well motivated  and is a good candidate for PT intervention.       Patient Participation: good    Patient Goal:   Reduce pain with intercourse    Goals: x 12 visits  1. Min to no pelvic floor muscle tightness and/or tenderness on palpation to indicate decreasing muscle spasm responsible for causing pt's c/o pelvic pain  2. Pt to progress to use of largest vaginal dilator in graduated set of dilators to indicate better ability to achieve vaginal penetration and readiness for intercourse  3. Decrease pain with intercourse to 2/10 max to allow pt to   Independent with HEP and symptom  management      Plan:   PT once/week as schedules allow for 12 visits:   manual therapy, gentle moist heat/ice as needed, therapeutic exercise, HEP and self-management techniques      Effie Shy, PT  Department of Rehabilitation  [facility]          Physician Agreement of Patient Plan of Care      Dear Dr. Earlene Plater,    Thank you for allowing [place] to participate in the care of [name]. Enclosed is the Physical/Occupational/Speech Therapy Evaluation and Plan of Care for this patient.     Regulations from the Center for Medicare and Medicaid Services (CMS) require your review and approval of this plan of care. Please note that since a prescription or order for therapy does not legally constitute approval of plan of care, we will not be able to proceed with therapy until we receive your approval of the plan of care.     Please sign and date the Physician Certification Statement below and fax this document back to [place] within 3 business days of receipt so we can initiate the therapy treatment plan.     Sincerely,     Duke Salvia  Director of Rehabilitation Services    [facility]  [facility]  Rehabilitation Department    Physician Certification  I have read and agreed with the plan of care for the above named patient who is under my care.     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

## 2015-03-21 ENCOUNTER — Ambulatory Visit
Admission: RE | Admit: 2015-03-21 | Discharge: 2015-03-21 | Disposition: A | Discharge status: Home / Self Care | Payer: BC Managed Care – PPO | Source: Ambulatory Visit | Attending: Gynecology | Admitting: Gynecology

## 2015-03-21 DIAGNOSIS — M7989 Other specified soft tissue disorders: Secondary | ICD-10-CM | POA: Insufficient documentation

## 2015-03-21 DIAGNOSIS — M62838 Other muscle spasm: Secondary | ICD-10-CM | POA: Insufficient documentation

## 2015-03-21 DIAGNOSIS — N941 Dyspareunia: Secondary | ICD-10-CM | POA: Insufficient documentation

## 2015-03-27 NOTE — Progress Notes (Signed)
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         Plan:  Cont PT: manual therapy, vaginal dilators in clinic and HEP with vaginal dilators        Goals:   Patient Goal:   Reduce pain with intercourse    Goals: x 12 visits  1. Min to no pelvic floor muscle tightness and/or tenderness on palpation to indicate decreasing muscle spasm responsible for causing pt's c/o pelvic pain  2. Pt to progress to use of largest vaginal dilator in graduated set of dilators to indicate better ability to achieve vaginal penetration and readiness for intercourse  3. Decrease pain with intercourse to 2/10 max to allow pt to   Independent with HEP and symptom management      #######################################################     Precautions: no  Falls Risk: no   Change in medications?:  no  Change in condition/hospitalization?:      Subjective:  Saw Dr. Kern Alberta on Mon - she was going to have assistant call in prescription for IBS  Prescribed test for bacterial overgrowth in gut - awaiting insurance authorization  Still awaiting appt with Dr. Rosamaria Lints - re-scheduled per Dr.  Earlene Plater - to get a second opinion  Has purchased vaginal dilators   Asking if/when she should start intercourse      Pain: no pain, but soreness around anus from frequent BMs  Patient is appropriate for Physical Therapy intervention at this time.  Pt is agreeable to participation in the therapy session.     Objective/Treatment Activities:   Manual therapy transvaginally to pelvic floor muscles with emphasis on R PC and iliococcygeus. Utilized trigger point release and gentle stretching.   Initiated use of vaginal dilators - started with smallest dilator and had pt practice self-inserting it, providing side to side stretch and in/out motion.  Pt was able to use the smallest dilator in the set without any difficulty, however, noted a small amount of blood tinged discharge on dilator following use. Pt reports she routinely has some bleeding following intercourse.  Discussed with pt that her question about when she should resume intercourse will be better answered once we see how she progrogresses over the next few sessions and how she does with using the vaginal dilators.        Pt Education:  Education in use of vaginal dilators for HEP    Educated the patient on plan of care,  goals of therapy and progression of exercises and/or activities as appropriate.      Effie Shy, PT  Department of Rehabilitation  [facility]

## 2015-03-28 ENCOUNTER — Ambulatory Visit
Admission: RE | Admit: 2015-03-28 | Discharge: 2015-03-28 | Disposition: A | Discharge status: Home / Self Care | Payer: BC Managed Care – PPO | Source: Ambulatory Visit | Attending: Gynecology | Admitting: Gynecology

## 2015-03-28 DIAGNOSIS — N941 Unspecified dyspareunia: Secondary | ICD-10-CM | POA: Insufficient documentation

## 2015-04-02 ENCOUNTER — Ambulatory Visit
Admission: RE | Admit: 2015-04-02 | Discharge: 2015-04-02 | Disposition: A | Discharge status: Home / Self Care | Payer: BC Managed Care – PPO | Source: Ambulatory Visit

## 2015-04-02 NOTE — Progress Notes (Signed)
[facility]  [address]  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  Moist heat prior to manual work next time may be helpful to see if pt tolerates manual work more comfortably.         Plan:  Cont PT: manual therapy, vaginal dilators in clinic and HEP with vaginal dilators. May use moist heat prior to manual work next time and ice again at end of session if pt finds icing helpful        Goals:   Patient Goal:   Reduce pain with intercourse    Goals: x 12 visits  1. Min to no pelvic floor muscle tightness and/or tenderness on palpation to indicate decreasing muscle spasm responsible for causing pt's c/o pelvic pain  2. Pt to progress to use of largest vaginal dilator in graduated set of dilators to indicate better ability to achieve vaginal penetration and readiness for intercourse  3. Decrease pain with intercourse to 2/10 max to allow pt to   Independent with HEP and symptom management      #######################################################     Precautions: no  Falls Risk: no   Change in medications?:  no  Change in condition/hospitalization?:   no    Subjective:  Had some soreness after last visit  Used the vaginal dilators for two days  Has worked up to the second dilator from smallest to largest        Pain: no c/o pain  Patient is appropriate for Physical Therapy intervention at this time.  Pt is agreeable to participation in the therapy session.     Objective/Treatment Activities:   Manual therapy transvaginally to pelvic floor muscles with emphasis on R PC and iliococcygeus. Utilized trigger point release and gentle stretching.   Upon initiating manual therapy, pt was doing a lot of muscle guarding with pelvic floor muscles and most tightness was palpable on R side in iliococcygeus region.  Over the course of the manual work, pt was able to reduce the pelvic floor muscle guarding and tightness, but there was still decreased tissue mobility, primarily on R.  Vaginal dilators - started with next to smallest dilator  Pt was able to fully insert dilator pretty easily and could manipulate the dilator to gently stretch and move dilator in/out without discomfort, however, noted some blood-tinged discharge on dilator when she removed it, therefore did not progress no next size up dilator even though pt may have been able to  tolerate the increase in terms of comfort.  Ice to perineum with pt dressed and sitting on gel pack x 8 min      Pt Education:  Educated in progression with vaginal dilators for HEP          Effie Shy, PT  Department of Rehabilitation  [facility]

## 2015-04-11 ENCOUNTER — Ambulatory Visit
Admission: RE | Admit: 2015-04-11 | Discharge: 2015-04-11 | Disposition: A | Discharge status: Home / Self Care | Payer: BC Managed Care – PPO | Source: Ambulatory Visit

## 2015-04-11 NOTE — Progress Notes (Signed)
[facility]   Physical Therapy Cancellation Note      Patient:  [name] MRN#:  [id number]  Unit:  PHYSICAL MEDICINE AND REHAB PT Room/Bed:  Room/bed info not found    04/11/2015  Time of appt: 09:00    Pt not seen for physical therapy secondary to pt called in earlier in AM to cancel today's appt due to not feeling well.    Effie Shy, PT  Department of Rehabilitation  [facility]

## 2015-05-14 ENCOUNTER — Ambulatory Visit: Payer: BC Managed Care – PPO

## 2015-05-14 NOTE — Progress Notes (Signed)
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         Plan:  Cont PT: manual therapy, vaginal dilators in clinic and HEP with vaginal dilators. May use moist heat prior to manual work next time and ice again at end of session if pt finds icing helpful      Goals:   Patient Goal:   Reduce pain with intercourse    Goals: x 12 visits  1. Min to no pelvic floor muscle tightness and/or tenderness on palpation to indicate decreasing muscle spasm responsible for causing pt's c/o pelvic pain  2. Pt to progress to use of largest vaginal dilator in graduated set of dilators to indicate better ability to achieve vaginal penetration and readiness for intercourse  3. Decrease pain with intercourse to 2/10 max to allow pt to participate in intercourse more comfortably  4. Independent with HEP and symptom management      #######################################################     Precautions: no  Falls Risk: no   Change in medications?:  no  Change in condition/hospitalization?:  no    Subjective:  Was fine after last visit, no soreness  Used the vaginal  dilator 2-3 times since last PT visit  Used the next up from smallest only and it was pretty comfortable      Pain: no c/o pain  Patient is appropriate for Physical Therapy intervention at this time.  Pt is agreeable to participation in the therapy session.     Objective/Treatment Activities:   Manual therapy transvaginally to pelvic floor muscles with emphasis on R PC and iliococcygeus. Utilized trigger point release and gentle stretching. Pt noted a little more discomfort with manual work today than at previous session.  Vaginal dilators - used next to smallest dilator with intention to progress to next dilator. Pt tolerated this dilator without discomfort but noted some pink-tinged discharge on the dilator so did not progress to next size dilator.  Ice to perineum with pt dressed and sitting on gel pack x 8 min      Pt Education:  Encouraged use of vaginal dilator        Effie Shy, PT  Department of Rehabilitation  [facility]

## 2015-05-21 ENCOUNTER — Ambulatory Visit
Admission: RE | Admit: 2015-05-21 | Discharge: 2015-05-21 | Disposition: A | Discharge status: Home / Self Care | Payer: BC Managed Care – PPO | Source: Ambulatory Visit | Attending: Gynecology | Admitting: Gynecology

## 2015-05-21 DIAGNOSIS — N941 Unspecified dyspareunia: Secondary | ICD-10-CM | POA: Insufficient documentation

## 2015-05-27 NOTE — Progress Notes (Signed)
[facility]  [address]  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 Less tightness noted today and no pink discharge noted on any of the dilators today as on previous sessions        Plan:  Cont PT: manual therapy, vaginal dilators in clinic and HEP with vaginal dilators. May use moist heat prior to manual work next time and ice again at end of session if pt finds icing helpful      Goals:   Patient Goal:   Reduce pain with intercourse    Goals: x 12 visits  1. Min to no pelvic floor muscle tightness and/or tenderness on palpation to indicate decreasing muscle spasm responsible for causing pt's c/o pelvic pain  2. Pt to progress to use of largest vaginal dilator in graduated set of dilators to indicate better ability to achieve vaginal penetration and readiness for intercourse  3. Decrease pain with intercourse to 2/10 max to allow pt to participate in intercourse more comfortably  4. Independent with HEP and symptom management      #######################################################     Precautions: no  Falls Risk: no   Change in medications?:  Entivio for colitis  Change in  condition/hospitalization?:  no    Subjective:  Hands are bothering me - very stiff- thinks from [place]  Has only used the dilators once because of the problems with her hands  Still using the second dilator      Pain: just pain in hands 6-7/10 - she is taking Motrin and has MD appt tomorrow  Patient is appropriate for Physical Therapy intervention at this time.  Pt is agreeable to participation in the therapy session.     Objective/Treatment Activities:     Manual therapy transvaginally to pelvic floor muscles with emphasis on R PC and iliococcygeus. Utilized trigger point release and gentle stretching. Initially pt had some discomfort  Vaginal dilators - started with next to smallest dilator - #2 dilator in the set and work up to through #3 and #4 dilators. Pt reported that the dilators inserted easily and without pain and there was no pink tinge discharge on the dilators today. Pt may have tolerated the next size up dilator but held off to avoid progressing too quickly   Ice to perineum with pt dressed and sitting on gel pack x 8 min      Pt Education:  Encouraged use of vaginal dilators at home when hand stiffness is not limiting her  Halina Maidens, PT  Department of [facility]

## 2015-05-28 ENCOUNTER — Ambulatory Visit
Admission: RE | Admit: 2015-05-28 | Discharge: 2015-05-28 | Disposition: A | Discharge status: Home / Self Care | Payer: BC Managed Care – PPO | Source: Ambulatory Visit | Attending: Gynecology | Admitting: Gynecology

## 2015-05-28 DIAGNOSIS — N941 Unspecified dyspareunia: Secondary | ICD-10-CM | POA: Insufficient documentation

## 2015-05-30 NOTE — Progress Notes (Signed)
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 Did not progress past #4 dilator today due to the small amount of pink discharge noticed after the dilator use         Plan:  Cont PT: manual therapy, vaginal dilators in clinic and HEP with vaginal dilators. May use moist heat prior to manual work next time and ice again at end of session if pt finds icing helpful      Goals:   Patient Goal:   Reduce pain with intercourse    Goals: x 12 visits  1. Min to no pelvic floor muscle tightness and/or tenderness on palpation to indicate decreasing muscle spasm responsible for causing pt's c/o pelvic pain  2. Pt to progress to use of largest vaginal dilator in graduated set of dilators to indicate better ability to achieve vaginal penetration and readiness for intercourse  3. Decrease pain with intercourse to 2/10 max to allow pt to participate in intercourse more comfortably  4. Independent with HEP and symptom management      #######################################################      Precautions: no  Falls Risk: no   Change in medications?:  Stopped Entivio ; Prednisone - one more pill to take  Change in condition/hospitalization?:  No; plan is to follow up with reumatologist    Subjective:  Used #4 dilator at home and had no bloody or pinkish discharge when using them  Pain meds helped the pain in her hands a lot      Pain: no pain during session, no pain with any of the dilators used  Patient is appropriate for Physical Therapy intervention at this time.  Pt is agreeable to participation in the therapy session.     Objective/Treatment Activities:   Manual therapy transvaginally to pelvic floor muscles with emphasis on R PC and iliococcygeus. Utilized trigger point release and gentle stretching. Some tightness initially which responded well to the manual work.  Vaginal dilators - started with next to smallest dilator - #2 dilator in the set and work up to through #3 and #4 dilators. Pt reported that the dilators inserted easily and without pain but there was a very slight amount of pink tinge discharge on the dilators today. Again stayed with the #4 dilator today due to noticing some of the pink discharge on dilators. Also noted some pink discharge on sheet mixed with  the lubricating gel.   Pt did not feel pain or discomfort with dilators or manual work.  Ice to perineum with pt dressed and sitting on gel pack x 8 min      Pt Education:  Nothing new - continue to use vaginal dilators as tolerated - no pain and keep eye out for the pink discharge         Effie Shy, PT  Department of Rehabilitation  [facility]

## 2015-06-04 ENCOUNTER — Ambulatory Visit
Admission: RE | Admit: 2015-06-04 | Discharge: 2015-06-04 | Disposition: A | Discharge status: Home / Self Care | Payer: BC Managed Care – PPO | Source: Ambulatory Visit

## 2015-06-06 NOTE — Progress Notes (Signed)
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 Thin vaginal tissues causing minor spotting of blood occasionally, but overall pt is tolerating therapy well and is progressing with being able to use larger vaginal dilato          Plan:  Cont PT: manual therapy, vaginal dilators in clinic and HEP with vaginal dilators. May use moist heat prior to manual work next time and ice again at end of session if pt finds icing helpful      Goals:   Patient Goal:   Reduce pain with intercourse    Goals: x 12 visits  1. Min to no pelvic floor muscle tightness and/or tenderness on palpation to indicate decreasing muscle spasm responsible for causing pt's c/o pelvic pain  2. Pt to progress to use of largest vaginal dilator in graduated set of dilators to indicate better ability to achieve vaginal penetration and readiness for intercourse  3. Decrease pain with intercourse to 2/10 max to allow pt to participate in intercourse more comfortably  4. Independent with HEP and symptom  management      #######################################################     Precautions: no  Falls Risk: no   Change in medications?:  Finished taking Prednisone  Change in condition/hospitalization?:  No; plan is to follow up with reumatologist    Subjective:  Used the vaginal dilators only once since last time she was here due to pre-holiday increase in activities.   No spotting of blood since she was last here.  Not sure if heat was helpful today prior to manual work, but did not have an irritating effect on her.      Pain: no c/o pain  Patient is appropriate for Physical Therapy intervention at this time.  Pt is agreeable to participation in the therapy session.     Objective/Treatment Activities:   Gentle moist heat to perineum with pt sitting on heat x 8 min.  Manual therapy transvaginally to pelvic floor muscles with emphasis on R PC and iliococcygeus. Minimal tightness initially which responded well to the manual work.  Vaginal dilators - started with next to smallest dilator - #3 dilator in the set and work up to through #4 and #5 dilators. Barely visible amount of pink tinge on very tip of each dilator. # 5 dilator inserted fairly easily but reported some tightness with side to side stretching.      Pt Education:  Encouraged increased use of vaginal dilators as able and as tolerated          Effie Shy, PT  Department of Rehabilitation  [facility]

## 2015-06-11 ENCOUNTER — Ambulatory Visit
Admission: RE | Admit: 2015-06-11 | Discharge: 2015-06-11 | Disposition: A | Discharge status: Home / Self Care | Payer: BC Managed Care – PPO | Source: Ambulatory Visit

## 2015-06-18 NOTE — Progress Notes (Addendum)
[facility]  [address]  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 Earlene Plater,    Thank you for allowing [place] to participate in the care of @NAME @. Enclosed is the Physical/Occupational/Speech Therapy Evaluation and Plan of Care for this patient.     Regulations from the Center for Medicare and Medicaid Services (CMS) require your review and approval of this plan of care. Please note that since a prescription or order for therapy does not legally constitute approval of plan of care, we will not be able to proceed with therapy until we receive your approval of the plan of care.     Please sign and date the Physician Certification Statement below and fax this document back to [place] within 3 business days of receipt so we can initiate the therapy treatment plan.     Sincerely,     Duke Salvia  Director of Rehabilitation Services    [facility]  [facility]  Rehabilitation Department    Physician Re-Certification  I have read and agreed with the plan of care for the above named patient who is under my  care.     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 She has progressed up to #5 of 6 dilators in the past, although today felt she wanted to stop at #4 dilator. Overall pt is tolerating therapy well and is progressing toward being able to use largest vaginal dilator leading toward being able to being able to resume intercourse with min to no pain. Pt has not completed full course of therapy in the initial certification period and would benefit from extending certification period for continued PT to help pt achieve the stated goals.         Plan:  Cont  PT: manual therapy, vaginal dilators in clinic and HEP with vaginal dilators. May use moist heat prior to manual work as pt finds helpful.    Goals:   Patient Goal:   Reduce pain with intercourse    Goals: x 12 visits  1. Min to no pelvic floor muscle tightness and/or tenderness on palpation to indicate decreasing muscle spasm responsible for causing pt's c/o pelvic pain - Progressing  2. Pt to progress to use of largest vaginal dilator in graduated set of dilators to indicate better ability to achieve vaginal penetration and readiness for intercourse -Progressing  3. Decrease pain with intercourse to 2/10 max to allow pt to participate in intercourse more comfortably - TBD  4. Independent with HEP and symptom management - Progressing      #######################################################     Precautions: no  Falls Risk:  no   Change in medications?:  no  Change in condition/hospitalization?:  No; appt with rheumatologist scheduled for 07/18/2015    Subjective:  Did not have time to practice with the vaginal dilators since last PT visit due to busy with holiday activities  Would like to try the heat again as did not seem to have the bleeding when we used it      Pain: no c/o pain  Patient is appropriate for Physical Therapy intervention at this time.  Pt is agreeable to participation in the therapy session.     Objective/Treatment Activities:   Gentle moist heat to perineum with pt sitting on heat x 8 min.  Manual therapy transvaginally to pelvic floor muscles with emphasis on R PC and iliococcygeus. Moderate tightness initially which responded well to the manual work.  Vaginal dilators - started with next to smallest dilator - #2 dilator in the set and work up to #4 dilator. Slight amount of visible pink tinge on dilators dilators, although all inserted fairly easily.       Pt Education:  Encouraged increased use of vaginal dilators as able and as tolerated        Effie Shy, PT  Department of Rehabilitation  [facility]

## 2015-06-25 ENCOUNTER — Ambulatory Visit
Admission: RE | Admit: 2015-06-25 | Discharge: 2015-06-25 | Disposition: A | Discharge status: Home / Self Care | Payer: BC Managed Care – PPO | Source: Ambulatory Visit | Attending: Gynecology | Admitting: Gynecology

## 2015-06-25 DIAGNOSIS — N941 Unspecified dyspareunia: Secondary | ICD-10-CM | POA: Insufficient documentation

## 2015-06-25 NOTE — Progress Notes (Signed)
[facility]  [address]  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    Therapy IO:NGEXBM floor muscle spasm, decreased tissue mobility, pain, decreased tolerance for intercourse due to pain     Date of Service: 07/02/2015  Time of Serive: 11:10 to 11: 50  Number of treatments: 9 of 12    TIMED TREATMENT CODES min UNTIMED TREATMENT CODES units   Manual therapy 20 min  PT Evaluation    Therapeutic Exercise   Canalith Repositioning Trmt    Neuromuscular Re-Ed  Hot/Cold Packs    Therapeutic Activity 15 min E-Stim (unattended)      Assessment:   Overall, less pelvic floor muscle spasm although R side feels tighter vs L so continuing with gentle stretching is indicated.             Plan:  Cont PT: manual therapy, vaginal dilators in clinic and HEP with vaginal dilators. May use moist heat prior to manual work as pt finds helpful.    Goals:   Patient Goal:   Reduce pain with intercourse    Goals: x 12 visits  1. Min to no pelvic floor muscle tightness and/or tenderness on palpation to indicate decreasing muscle spasm responsible for causing pt's c/o pelvic pain - Progressing  2. Pt to progress to use of largest vaginal dilator in graduated set of dilators to indicate better ability to achieve vaginal penetration and readiness for intercourse -Progressing  3. Decrease pain with intercourse to 2/10 max to allow pt to participate in intercourse more comfortably - TBD  4. Independent with HEP and symptom management - Progressing      #######################################################     Precautions: no  Falls Risk: no   Change in medications?:  no  Change in  condition/hospitalization?:  No; appt with rheumatologist scheduled for 07/18/2015    Subjective:  Only had opportunity to use the vaginal dilators once since last visit  Went up to #4 or #5 dilator, did not notice any bleeding with using the dilators at home  At this point does not think the heat is helpful prior to PT session      Pain: no c/o pain  Patient is appropriate for Physical Therapy intervention at this time.  Pt is agreeable to participation in the therapy session.     Objective/Treatment Activities:   Manual therapy transvaginally to pelvic floor muscles with emphasis on R PC and iliococcygeus. Utilized minimal/very gentle stretching today in order to see if any pink will still be visible on the dilators.  Vaginal dilators - started with #3  dilator in the set and work up to #5 dilator.   Barely visible slight amt of pink tinge on # 3 dilator dilator today, otherwise none noticeable pink on dilators today      Pt Education:  Encouraged increased use of vaginal dilators as able and as tolerated        Effie Shy, PT  Department of Rehabilitation  [facility]

## 2015-07-02 ENCOUNTER — Ambulatory Visit
Admission: RE | Admit: 2015-07-02 | Discharge: 2015-07-02 | Disposition: A | Discharge status: Home / Self Care | Payer: BC Managed Care – PPO | Source: Ambulatory Visit

## 2015-07-09 ENCOUNTER — Ambulatory Visit
Admission: RE | Admit: 2015-07-09 | Discharge: 2015-07-09 | Disposition: A | Discharge status: Home / Self Care | Payer: BC Managed Care – PPO | Source: Ambulatory Visit

## 2015-07-09 NOTE — Progress Notes (Signed)
[facility]   Physical Therapy Cancellation Note      Patient:  [name] MRN#:  [id number]  Unit:  PHYSICAL MEDICINE AND REHAB PT Room/Bed:  Room/bed info not found    07/09/2015  Time of appt: 11:00      Pt not seen for physical therapy secondary to pt cancelled today's appointment due to being sick.     Effie Shy, PT  Department of Rehabilitation  [facility]

## 2015-07-16 ENCOUNTER — Ambulatory Visit: Admission: RE | Admit: 2015-07-16 | Payer: BC Managed Care – PPO | Source: Ambulatory Visit

## 2015-07-18 ENCOUNTER — Other Ambulatory Visit: Payer: Self-pay | Admitting: Rheumatology

## 2015-08-08 ENCOUNTER — Ambulatory Visit: Payer: BC Managed Care – PPO

## 2016-06-17 ENCOUNTER — Emergency Department (HOSPITAL_COMMUNITY): Payer: BLUE CROSS/BLUE SHIELD

## 2016-06-17 ENCOUNTER — Emergency Department (HOSPITAL_COMMUNITY)
Admission: EM | Admit: 2016-06-17 | Discharge: 2016-06-17 | Disposition: A | Discharge status: Home / Self Care | Payer: BLUE CROSS/BLUE SHIELD | Attending: Emergency Medicine | Admitting: Emergency Medicine

## 2016-06-17 ENCOUNTER — Encounter (HOSPITAL_COMMUNITY): Payer: Self-pay | Admitting: Emergency Medicine

## 2016-06-17 DIAGNOSIS — R1012 Left upper quadrant pain: Secondary | ICD-10-CM | POA: Diagnosis present

## 2016-06-17 DIAGNOSIS — Z7982 Long term (current) use of aspirin: Secondary | ICD-10-CM | POA: Insufficient documentation

## 2016-06-17 DIAGNOSIS — R9431 Abnormal electrocardiogram [ECG] [EKG]: Secondary | ICD-10-CM | POA: Diagnosis not present

## 2016-06-17 DIAGNOSIS — Z79899 Other long term (current) drug therapy: Secondary | ICD-10-CM | POA: Diagnosis not present

## 2016-06-17 LAB — CBC WITH DIFFERENTIAL/PLATELET
BASOS ABS: 0 10*3/uL (ref 0.0–0.1)
Basophils Relative: 0 %
EOS ABS: 0 10*3/uL (ref 0.0–0.7)
EOS PCT: 0 %
HCT: 44.9 % (ref 36.0–46.0)
HEMOGLOBIN: 15.7 g/dL — AB (ref 12.0–15.0)
LYMPHS PCT: 21 %
Lymphs Abs: 1.1 10*3/uL (ref 0.7–4.0)
MCH: 31.1 pg (ref 26.0–34.0)
MCHC: 35 g/dL (ref 30.0–36.0)
MCV: 88.9 fL (ref 78.0–100.0)
Monocytes Absolute: 0.7 10*3/uL (ref 0.1–1.0)
Monocytes Relative: 13 %
NEUTROS PCT: 66 %
Neutro Abs: 3.5 10*3/uL (ref 1.7–7.7)
PLATELETS: 263 10*3/uL (ref 150–400)
RBC: 5.05 MIL/uL (ref 3.87–5.11)
RDW: 12.5 % (ref 11.5–15.5)
WBC: 5.3 10*3/uL (ref 4.0–10.5)

## 2016-06-17 LAB — COMPREHENSIVE METABOLIC PANEL
ALT: 18 U/L (ref 14–54)
ANION GAP: 11 (ref 5–15)
AST: 21 U/L (ref 15–41)
Albumin: 3.2 g/dL — ABNORMAL LOW (ref 3.5–5.0)
Alkaline Phosphatase: 62 U/L (ref 38–126)
BILIRUBIN TOTAL: 0.4 mg/dL (ref 0.3–1.2)
BUN: 19 mg/dL (ref 6–20)
CO2: 23 mmol/L (ref 22–32)
Calcium: 8.8 mg/dL — ABNORMAL LOW (ref 8.9–10.3)
Chloride: 102 mmol/L (ref 101–111)
Creatinine, Ser: 0.69 mg/dL (ref 0.44–1.00)
Glucose, Bld: 128 mg/dL — ABNORMAL HIGH (ref 65–99)
POTASSIUM: 2.9 mmol/L — AB (ref 3.5–5.1)
Sodium: 136 mmol/L (ref 135–145)
TOTAL PROTEIN: 6.1 g/dL — AB (ref 6.5–8.1)

## 2016-06-17 LAB — I-STAT TROPONIN, ED
TROPONIN I, POC: 0.03 ng/mL (ref 0.00–0.08)
TROPONIN I, POC: 0.01 ng/mL (ref 0.00–0.08)

## 2016-06-17 LAB — LIPASE, BLOOD: LIPASE: 22 U/L (ref 11–51)

## 2016-06-17 MED ORDER — ONDANSETRON HCL 4 MG/2ML IJ SOLN
4.0000 mg | Freq: Once | INTRAMUSCULAR | Status: AC
  Administered 2016-06-17: 4 mg via INTRAVENOUS
  Filled 2016-06-17: qty 2

## 2016-06-17 MED ORDER — POTASSIUM CHLORIDE CRYS ER 20 MEQ PO TBCR
40.0000 meq | EXTENDED_RELEASE_TABLET | Freq: Once | ORAL | Status: AC
  Administered 2016-06-17: 40 meq via ORAL
  Filled 2016-06-17: qty 2

## 2016-06-17 MED ORDER — GI COCKTAIL ~~LOC~~
30.0000 mL | Freq: Once | ORAL | Status: DC
Start: 2016-06-17 — End: 2016-06-17
  Filled 2016-06-17: qty 30

## 2016-06-17 MED ORDER — MORPHINE SULFATE (PF) 4 MG/ML IV SOLN
4.0000 mg | Freq: Once | INTRAVENOUS | Status: DC
Start: 2016-06-17 — End: 2016-06-17

## 2016-06-17 MED ORDER — SODIUM CHLORIDE 0.9 % IV BOLUS (SEPSIS)
1000.0000 mL | Freq: Once | INTRAVENOUS | Status: AC
  Administered 2016-06-17: 1000 mL via INTRAVENOUS

## 2016-06-17 MED ORDER — ONDANSETRON HCL 4 MG PO TABS: 4.0000 mg | ORAL_TABLET | Freq: Four times a day (QID) | ORAL | 0 refills | Status: AC

## 2016-06-17 NOTE — ED Triage Notes (Signed)
To ED [facility] [place]-- pt sent here for EKG changes, was seen for abd pain and vomiting that started on Sunday-  Woke up on Sunday with abd pain, then nausea started 2 hours later. Is able to keep liquids down, but pain is still there. 1/10 at present Received baby asa x4 enroute

## 2016-06-17 NOTE — ED Notes (Signed)
Pt would like to wait before receiving Morphine-- if pain gets worse she will let staff know.

## 2016-06-17 NOTE — ED Provider Notes (Signed)
[facility]-EMERGENCY DEPT Provider Note   CSN: [id number] Arrival date & time: 06/17/16  [id number]     History   Chief Complaint Chief Complaint  Patient presents with  . Abdominal Pain  . sent from urgent care  . Abnormal ECG    HPI [name] CaldwellRobyn Lalone is a 55 y.o. female.  Patient presents to the ED with a chief complaint of upper abdominal pain.  She reports that she has had the symptoms since Sunday.  She reports associated nausea and vomiting, which has been improving, but the pain has remained.  She was seen at urgent care today, where an EKG was performed and found to be abnormal.  Patient was then brought to the ED for further evaluation.  She denies any chest pain, SOB, or diaphoresis.  The symptoms have been constant since Sunday.  She denies any heart problems or family history of heart disease.  She was given aspirin by EMS.  She currently rates her abdominal pain as a 1/10.   The history is provided by the patient. No language interpreter was used.    History reviewed. No pertinent past medical history.  Patient Active Problem List   Diagnosis Date Noted  . Intertrochanteric fracture of right femur (HCC) 05/15/2012  . Hypokalemia 05/15/2012    Past Surgical History:  Procedure Laterality Date  . FEMUR IM NAIL  05/15/2012   Procedure: INTRAMEDULLARY (IM) NAIL FEMORAL;  Surgeon: Mable ParisJustin William Chandler, MD;  Location: [facility]S;  Service: Orthopedics;  Laterality: Right;    OB History    No data available       Home Medications    Prior to Admission medications   Medication Sig Start Date End Date Taking? Authorizing Provider  aspirin 325 MG EC tablet Take 1 tablet (325 mg total) by mouth 2 (two) times daily. Patient not taking: Reported on 01/11/2015 05/16/12   Jones BroomJustin Chandler, MD  calcium-vitamin D (OSCAL WITH D) 500-200 MG-UNIT per tablet Take 2 tablets by mouth 2 (two) times daily. Patient not taking: Reported on 01/11/2015 05/17/12   Vassie Lollarlos Madera, MD  docusate sodium  100 MG CAPS Take 100 mg by mouth 2 (two) times daily. Hold for diarrhea Patient not taking: Reported on 01/11/2015 05/17/12   Vassie Lollarlos Madera, MD  ferrous fumarate (HEMOCYTE - 106 MG FE) 325 (106 FE) MG TABS Take 1 tablet (106 mg of iron total) by mouth 2 (two) times daily. Patient not taking: Reported on 01/11/2015 05/17/12   Vassie Lollarlos Madera, MD  Glucosamine 500 MG CAPS Take 1,000 mg by mouth daily.     Historical Provider, MD  HYDROcodone-acetaminophen (NORCO/VICODIN) 5-325 MG per tablet Take 1-2 tablets by mouth every 4 (four) hours as needed. Patient not taking: Reported on 01/11/2015 05/16/12   Jones BroomJustin Chandler, MD  ibuprofen (ADVIL,MOTRIN) 200 MG tablet Take 400 mg by mouth every 6 (six) hours as needed for headache, mild pain or moderate pain.     Historical Provider, MD  omeprazole (PRILOSEC) 40 MG capsule Take 1 capsule (40 mg total) by mouth daily. Patient not taking: Reported on 01/11/2015 05/17/12   Vassie Lollarlos Madera, MD  ondansetron (ZOFRAN) 4 MG tablet Take 1 tablet (4 mg total) by mouth every 6 (six) hours. 01/11/15   Bethann BerkshireJoseph Zammit, MD  oxyCODONE-acetaminophen (PERCOCET) 5-325 MG per tablet Take 1 tablet by mouth every 6 (six) hours as needed. 01/11/15   Bethann BerkshireJoseph Zammit, MD  tamsulosin (FLOMAX) 0.4 MG CAPS capsule Take 1 capsule (0.4 mg total) by mouth daily. 01/11/15   Jomarie LongsJoseph  Estell HarpinZammit, MD    Family History No family history on file.  Social History Social History  Substance Use Topics  . Smoking status: Never Smoker  . Smokeless tobacco: Not on file  . Alcohol use No     Allergies   Patient has no known allergies.   Review of Systems Review of Systems  All other systems reviewed and are negative.    Physical Exam Updated Vital Signs BP 159/95 (BP Location: Right Arm)   Pulse 106   Temp 98.5 F (36.9 C) (Oral)   Resp 18   Ht 5\' 7"  (1.702 m)   Wt 69.9 kg   SpO2 100%   BMI 24.12 kg/m   Physical Exam  Constitutional: She is oriented to person, place, and time. She appears  well-developed and well-nourished.  HENT:  Head: Normocephalic and atraumatic.  Eyes: Conjunctivae and EOM are normal. Pupils are equal, round, and reactive to light.  Neck: Normal range of motion. Neck supple.  Cardiovascular: Normal rate and regular rhythm.  Exam reveals no gallop and no friction rub.   No murmur heard. Pulmonary/Chest: Effort normal and breath sounds normal. No respiratory distress. She has no wheezes. She has no rales. She exhibits no tenderness.  CTAB  Abdominal: Soft. Bowel sounds are normal. She exhibits no distension and no mass. There is no tenderness. There is no rebound and no guarding.  No focal abdominal tenderness, no RLQ tenderness or pain at McBurney's point, no RUQ tenderness or Murphy's sign, no left-sided abdominal tenderness, no fluid wave, or signs of peritonitis   Musculoskeletal: Normal range of motion. She exhibits no edema or tenderness.  Neurological: She is alert and oriented to person, place, and time.  Skin: Skin is warm and dry.  Psychiatric: She has a normal mood and affect. Her behavior is normal. Judgment and thought content normal.  Nursing note and vitals reviewed.    ED Treatments / Results  Labs (all labs ordered are listed, but only abnormal results are displayed) Labs Reviewed  LIPASE, BLOOD  COMPREHENSIVE METABOLIC PANEL  CBC WITH DIFFERENTIAL/PLATELET  I-STAT TROPOININ, ED    EKG  EKG Interpretation None       Radiology No results found.  Procedures Procedures (including critical care time)  Medications Ordered in ED Medications  morphine 4 MG/ML injection 4 mg (not administered)  ondansetron (ZOFRAN) injection 4 mg (not administered)  gi cocktail (Maalox,Lidocaine,Donnatal) (not administered)     Initial Impression / Assessment and Plan / ED Course  I have reviewed the triage vital signs and the nursing notes.  Pertinent labs & imaging results that were available during my care of the patient were  reviewed by me and considered in my medical decision making (see chart for details).  Clinical Course    Patient with constant upper abdominal pain since Sunday.  With associated, but improving, n/v.  No CP or SOB.  [facility].  Low risk for ACS.  Will give GI cocktail, treat pain, and reassess.  EKG reviewed with Dr. Donnald GarrePfeiffer, who believes that minimal ST depressions are secondary to tachycardia. Patient does not have any chest pain or shortness of breath. She has no cardiac history. She is low risk for ACS. Patient given fluids and Zofran. Offer GI cocktail and morphine. Patient refused these medications. She states that she is feeling significantly better. Heart rate has normalized. Repeat EKG is improved. She is symptom-free at this time. Delta troponin is negative. Feel the patient  is stable and safe for discharge to home. Return precautions given.  Final Clinical Impressions(s) / ED Diagnoses   Final diagnoses:  Left upper quadrant pain  Abnormal EKG    New Prescriptions Discharge Medication List as of 06/17/2016  2:50 PM       Roxy Horseman, PA-C 06/17/16 1510    Arby Barrette, MD 06/19/16 [phone]

## 2016-07-18 IMAGING — CT CT RENAL STONE PROTOCOL
2 of 3 series · 17 of 39 positions shown, 19 images · non-contrast
Comparison: None.

CLINICAL DATA: Kidney stone? Right flank pain and dysuria onset
this morning.

EXAM:
CT ABDOMEN AND PELVIS WITHOUT CONTRAST
TECHNIQUE: Multidetector CT imaging of the abdomen and pelvis was performed
following the standard protocol without IV contrast.

[Series 4: lung · axial · 0.66mm/px · z∈[-166,-56]mm · 14 of 25 slices shown, 16 images]
[im 2/25  soft-tissue]
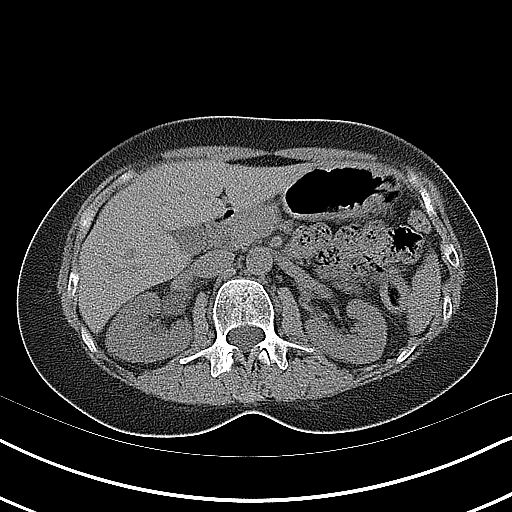
[im 2/25  bone]
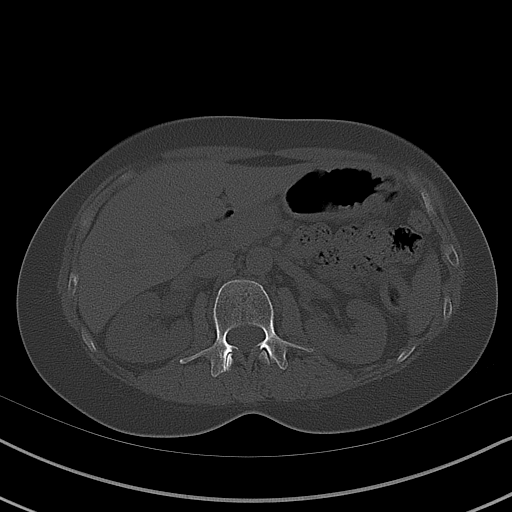
[im 4/25  soft-tissue]
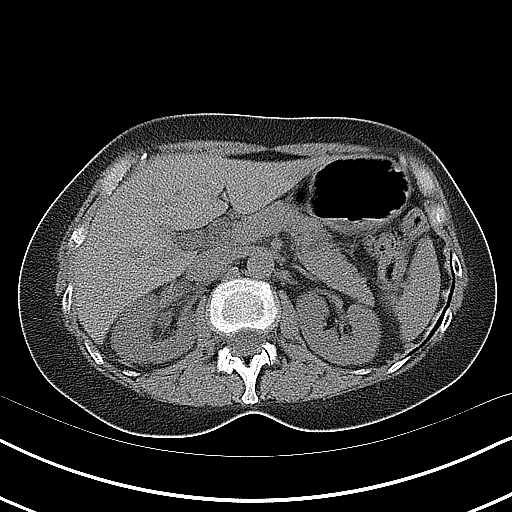
[im 6/25  soft-tissue]
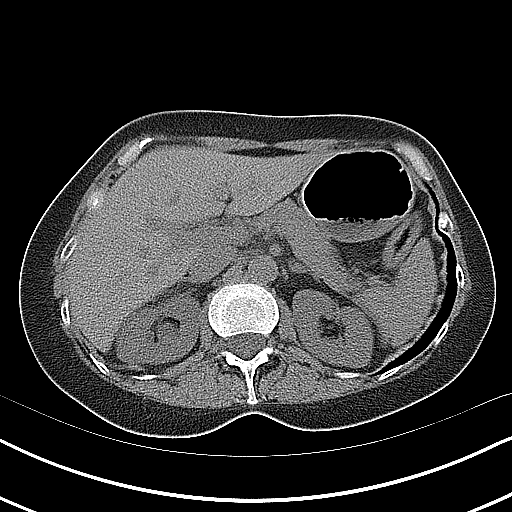
[im 7/25  soft-tissue]
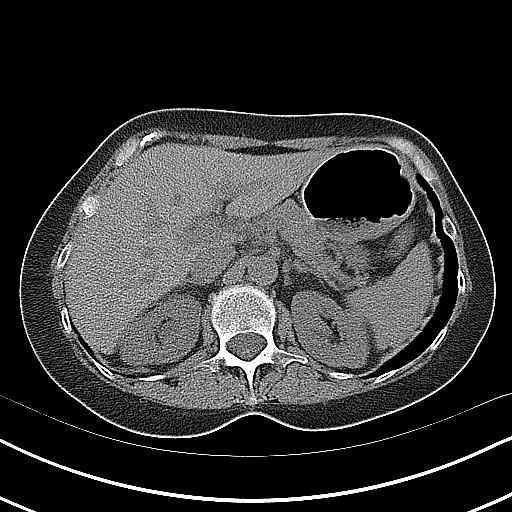
[im 9/25  soft-tissue]
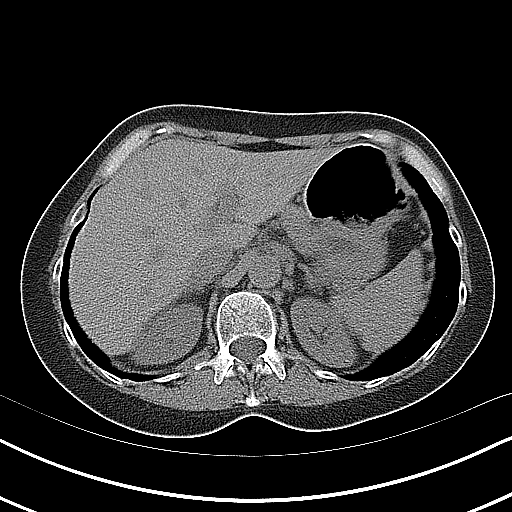
[im 11/25  soft-tissue]
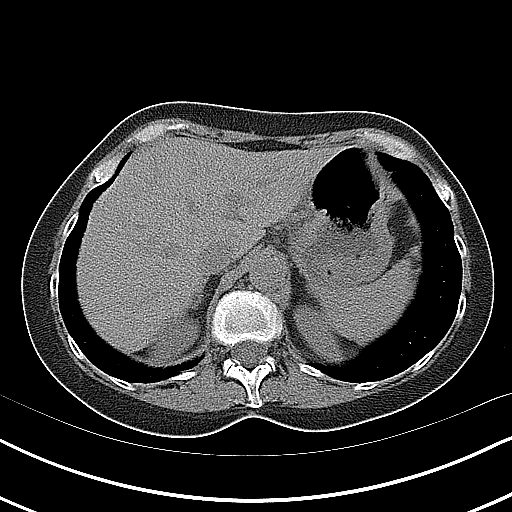
[im 12/25  soft-tissue]
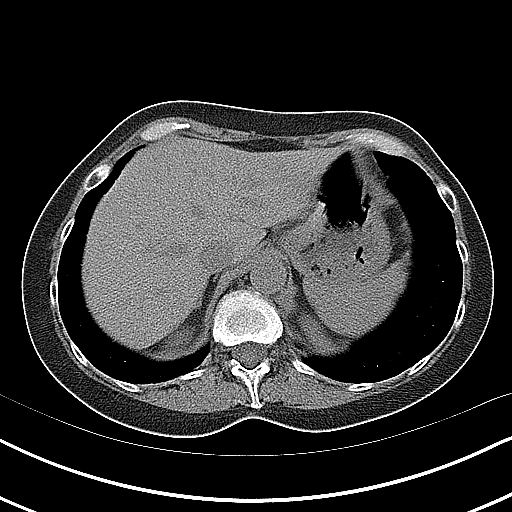
[im 14/25  soft-tissue]
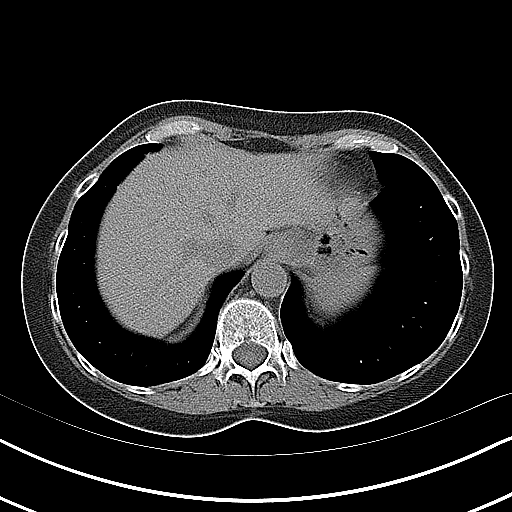
[im 15/25  soft-tissue]
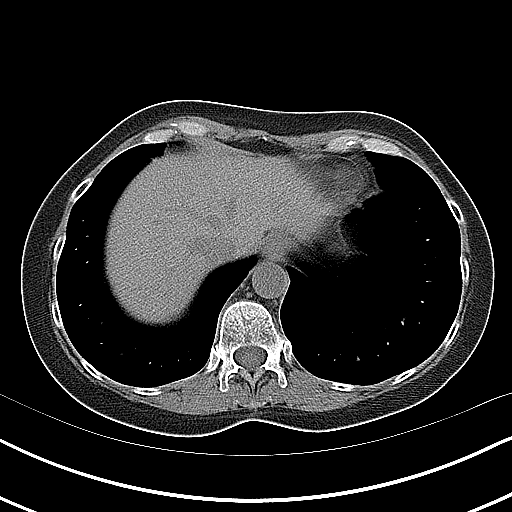
[im 15/25  bone]
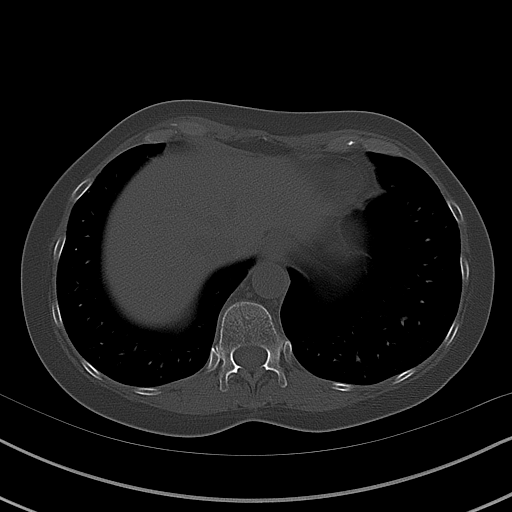
[im 17/25  soft-tissue]
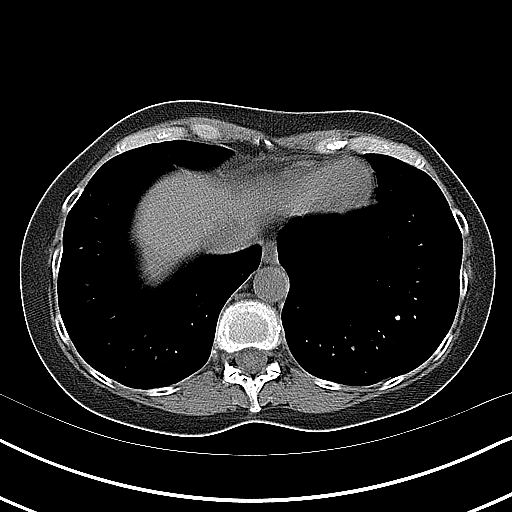
[im 19/25  soft-tissue]
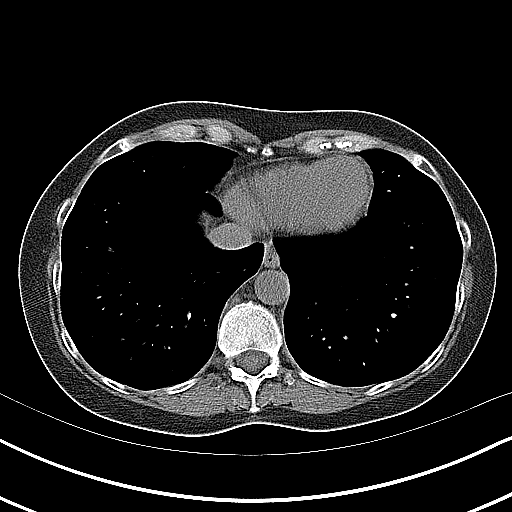
[im 20/25  soft-tissue]
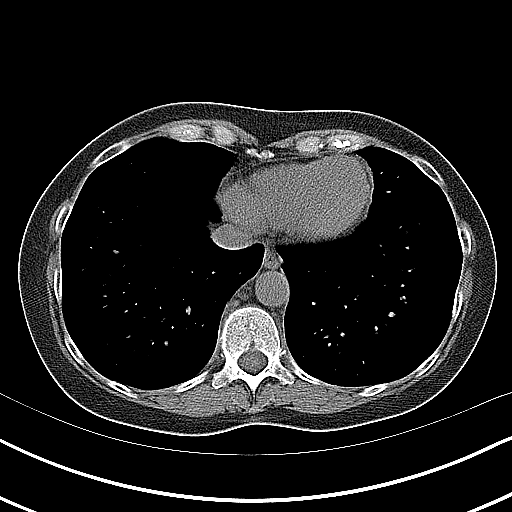
[im 22/25  soft-tissue]
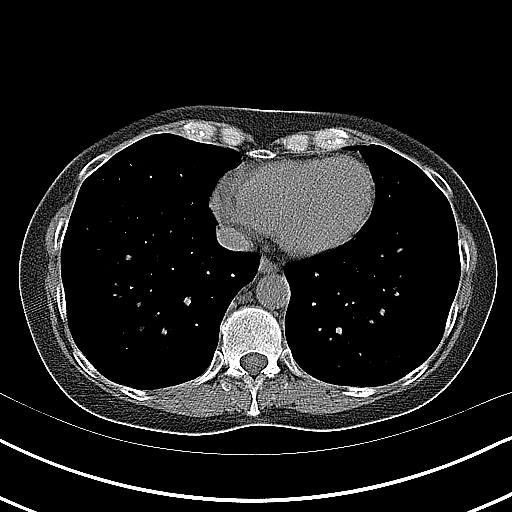
[im 24/25  soft-tissue]
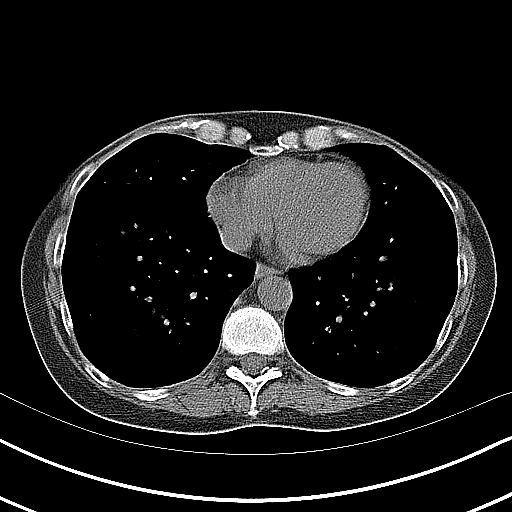

[Series 5: coronal · coronal · 0.68mm/px · 3 of 77 slices shown]
[im 26/77  soft-tissue]
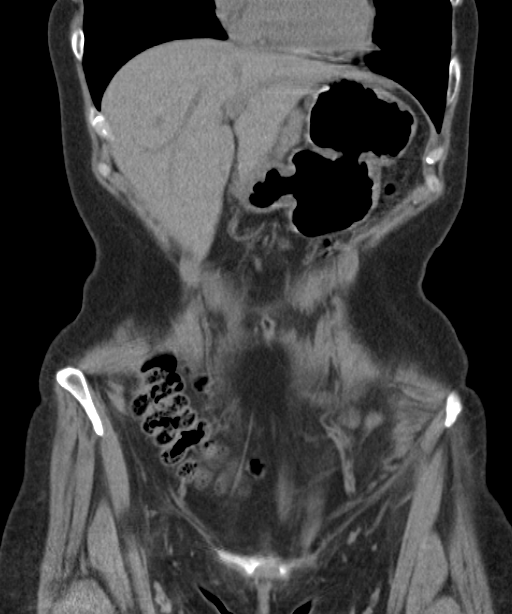
[im 34/77  soft-tissue]
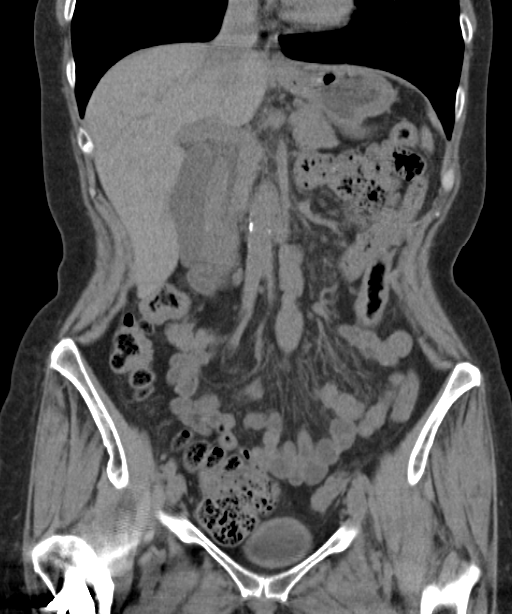
[im 43/77  soft-tissue]
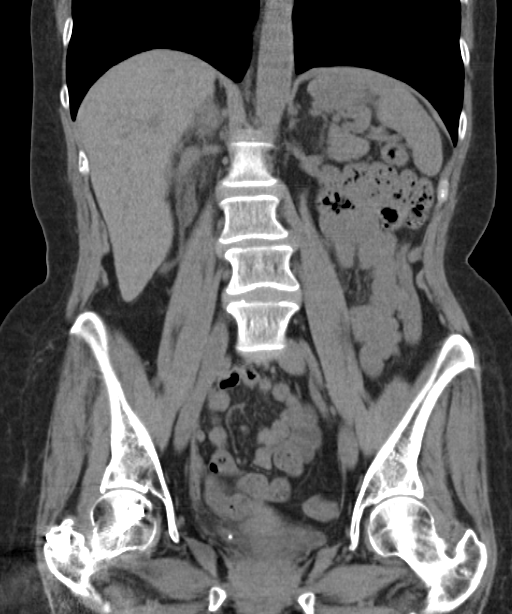

[17 of 39 positions shown; findings below may reference images not displayed]

FINDINGS: There is a 3 mm stone at the right ureterovesical junction causing
mild right-sided hydronephrosis. There is associated periureteral
inflammatory fluid stranding about the proximal right ureter. Left
kidney appears normal without stone or hydronephrosis.

Liver, spleen, pancreas, gallbladder, and adrenal glands appear
normal. Bowel is normal in caliber. No bowel wall thickening or
evidence of bowel wall inflammation seen. Appendix is normal. No
free fluid or abscess collection. No free intraperitoneal air. No
enlarged lymph nodes seen. Adnexal regions are unremarkable.

Abdominal aorta is normal in caliber. Lung bases are clear. There is
mild degenerative change within the lumbar spine but no acute
osseous abnormality. Right hip fixation screw in place. Superficial
soft tissues are unremarkable.
IMPRESSION: 3 mm stone at the right UVJ causing mild right-sided hydronephrosis.

## 2017-06-01 DIAGNOSIS — I10 Essential (primary) hypertension: Secondary | ICD-10-CM | POA: Insufficient documentation

## 2017-12-23 IMAGING — DX DG CHEST 2V
2 series · 2 of 2 positions shown · non-contrast
Comparison: 05/14/2016

CLINICAL DATA: Epigastric pain for 3 days

EXAM:
CHEST  2 VIEW

[w chest pa]
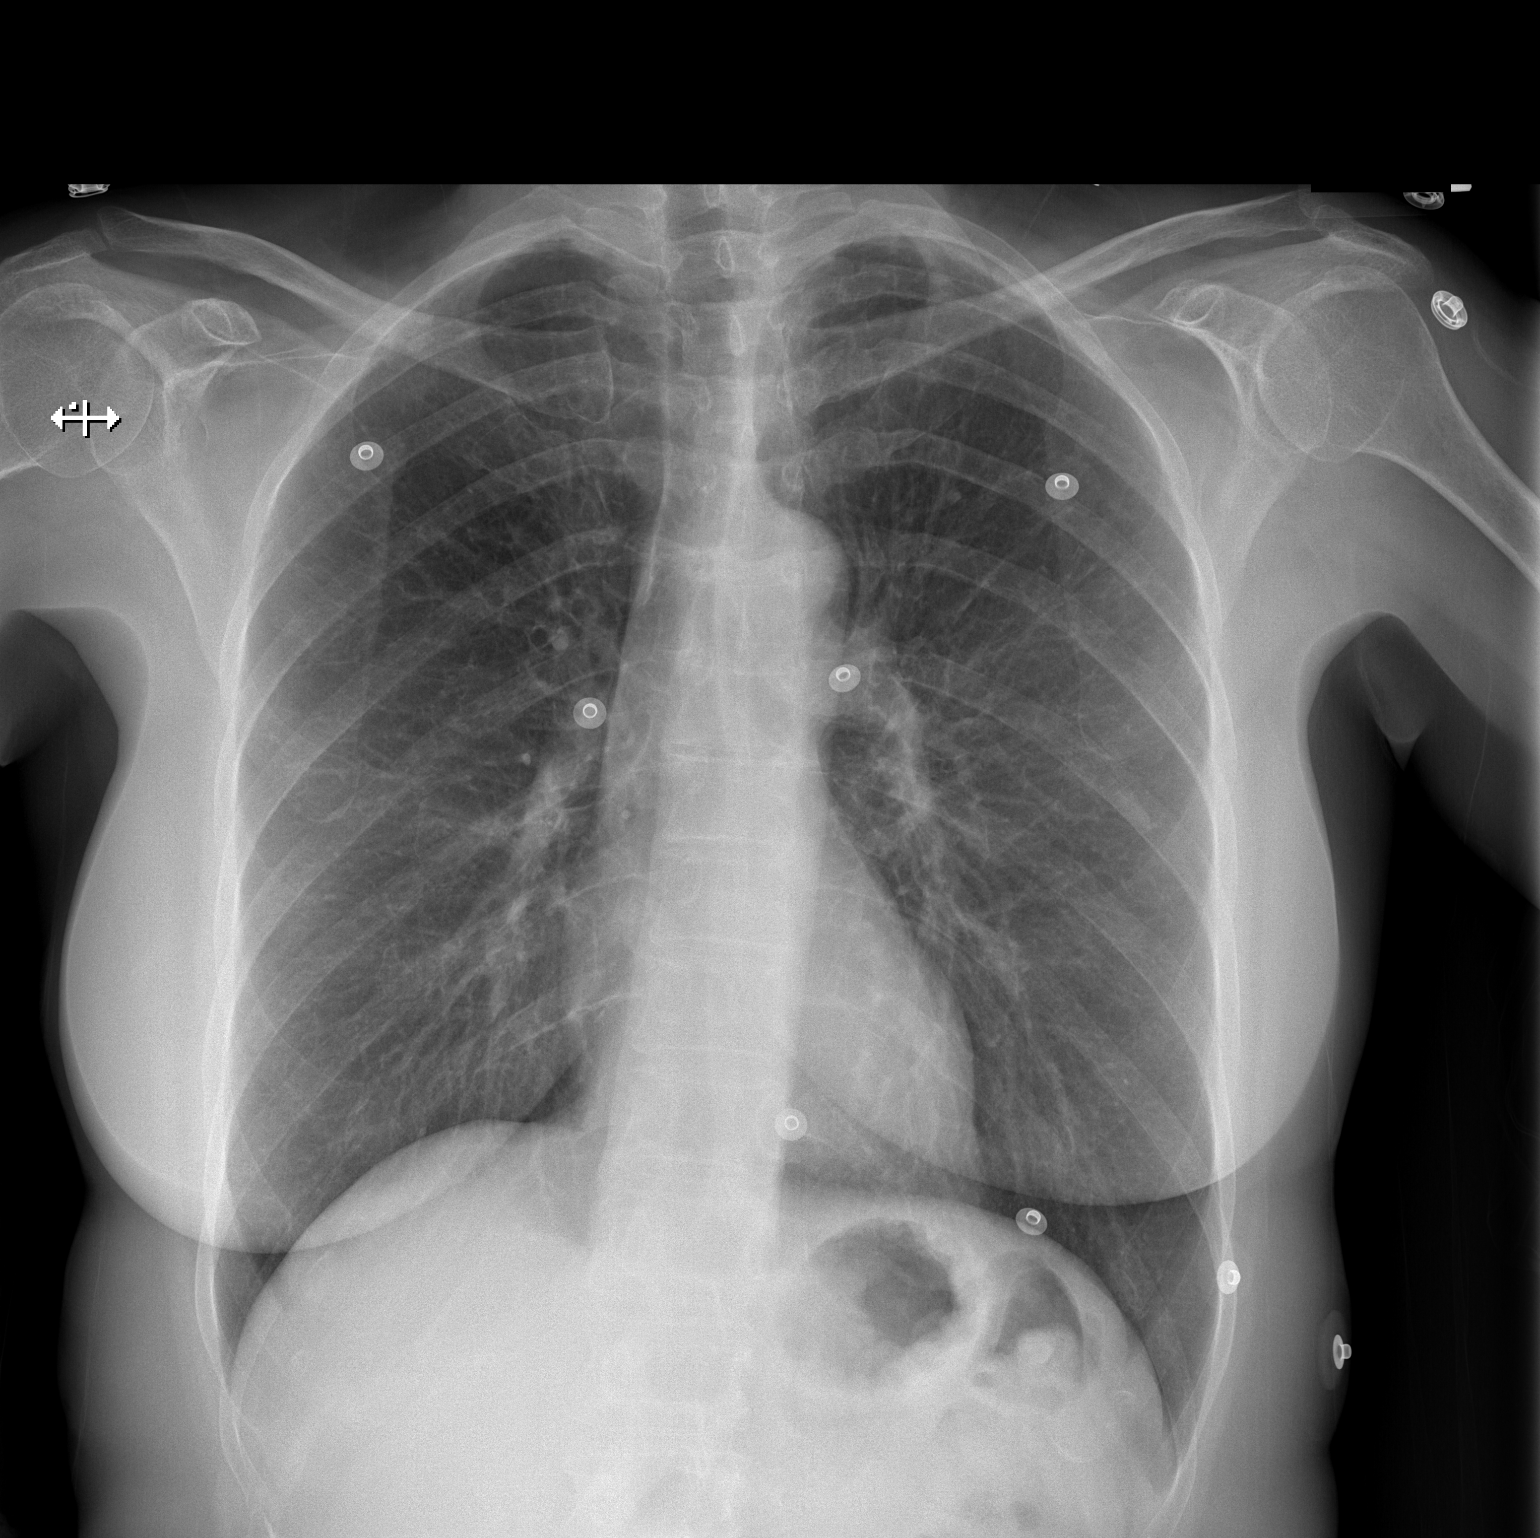

[w chest lat]
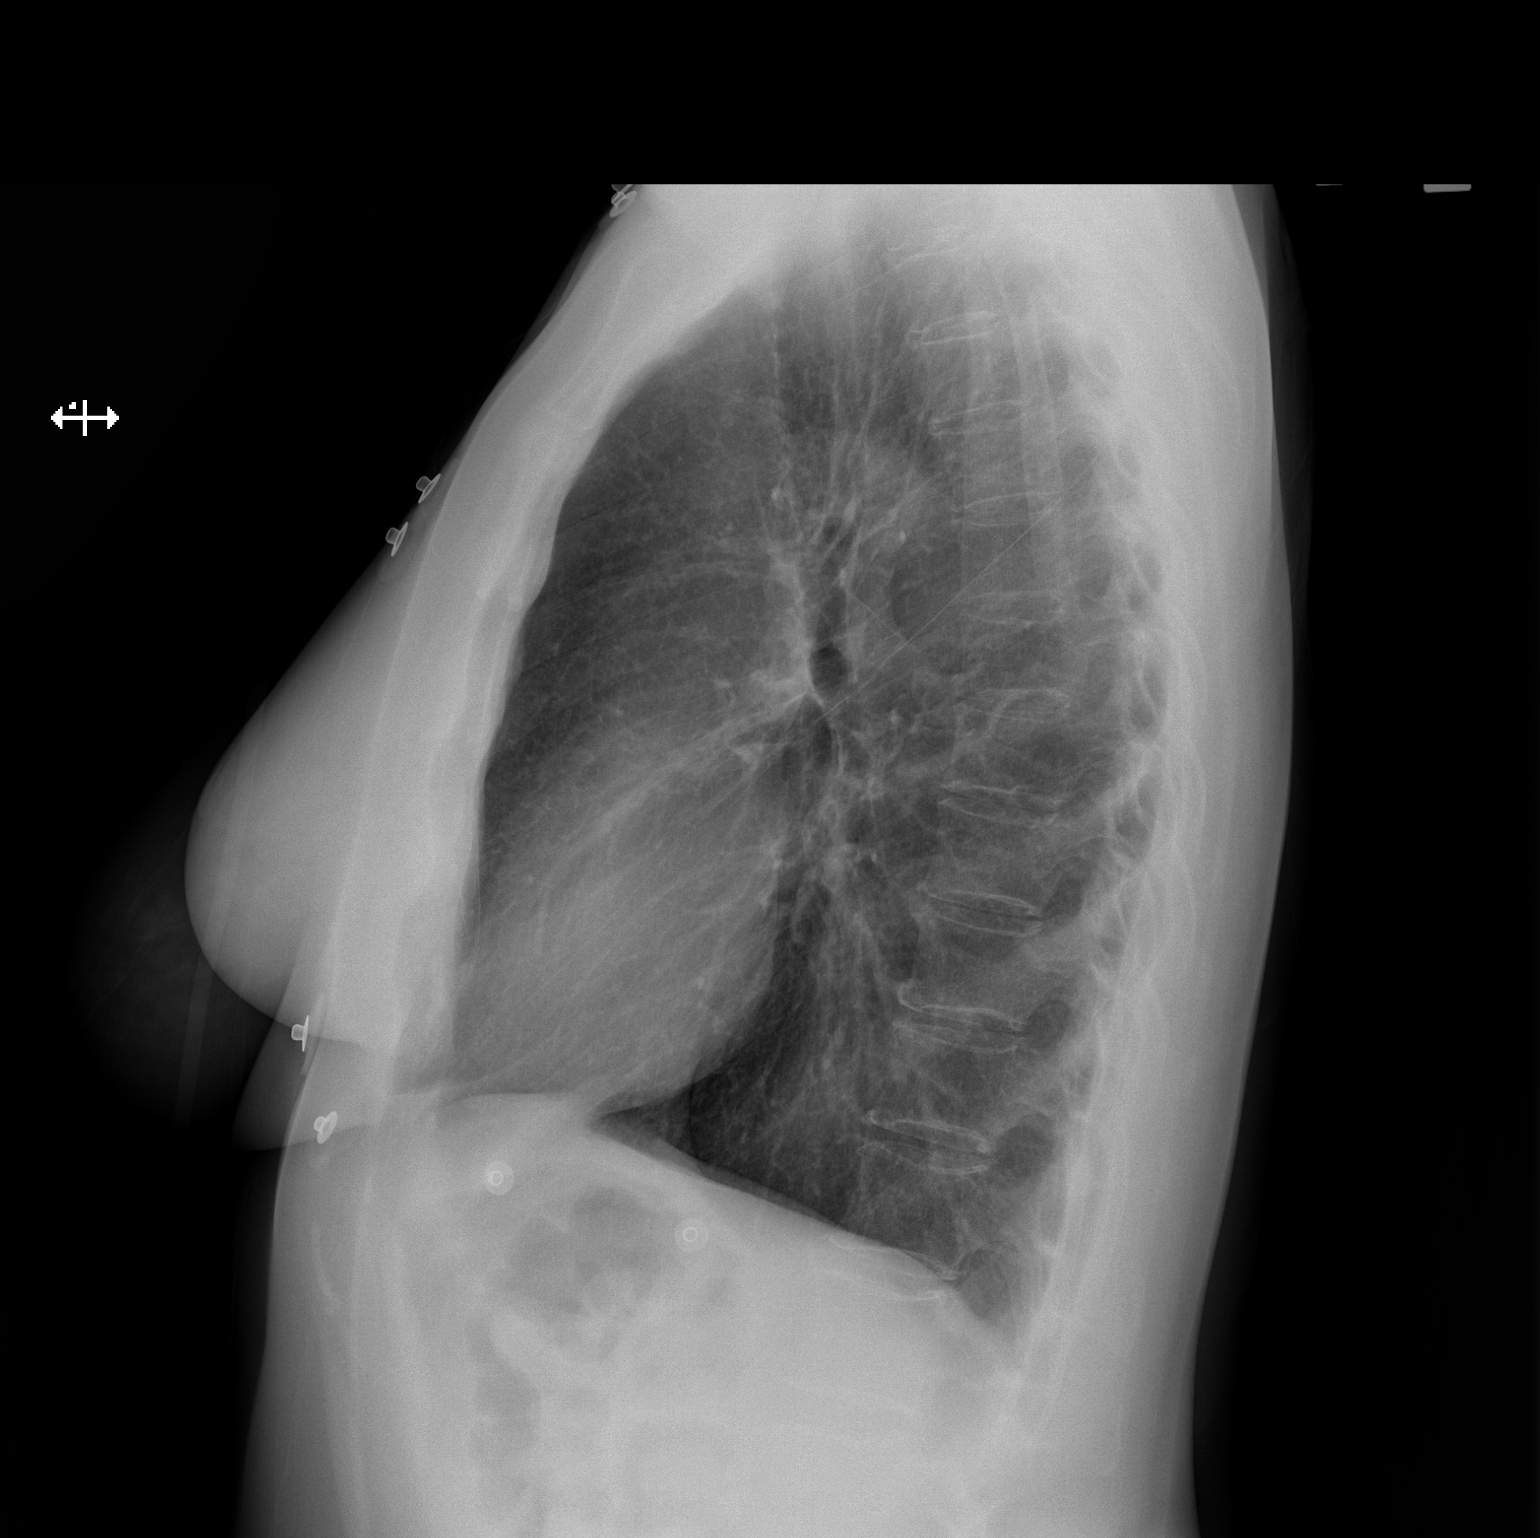

[2 of 2 positions shown; findings below may reference images not displayed]

FINDINGS: Normal mediastinum and cardiac silhouette. Normal pulmonary
vasculature. No evidence of effusion, infiltrate, or pneumothorax.
No acute bony abnormality.
IMPRESSION: No acute cardiopulmonary process.

## 2019-05-11 ENCOUNTER — Encounter: Payer: Self-pay | Admitting: Ophthalmology

## 2019-05-11 ENCOUNTER — Ambulatory Visit: Payer: HMO | Attending: Ophthalmology

## 2019-05-11 NOTE — Pre-Procedure Instructions (Signed)
•   Surgical Risk Level : (Low, Intermediate, High)  low     Surgeon Testing Requirements:  o preop labs/ekg/clearance, covid     Anesthesia Guideline Requirements:  o n/a     Specialist Notes / Test Results / Records Requested:  o n/a  o    Recent Hospitalization / ED Visit:   o n/a     Future Plan / Upcoming Appts:   o covid 11/30     Labs/Testing @ [facility]:   o n/a     Email Sent To Patient:   o n/a  o    Faxes Sent To:   o Surgeon for all preops/hp/consent, and preop eye drop med order if ordered     Epic Orders Entered:   o PIV LR     Other Outlying information gathered that does not fit anywhere else  o No preop medication order noted     Chart Room Handoff for Further  Follow-up if Applicable:  o Preops/covid    NPO after MN reinforced, pt voiced understanding    Covid 19 hospital precautions reviewed with pt. Pt verbalizes understanding and does not have any further questions.

## 2019-05-16 NOTE — Pre-Procedure Instructions (Signed)
Called Dr. Irwin Brakeman office [phone] for preops. Spoke to [place] who requires fax request. Sent fax request to [phone] requesting medical clearance, labs and ekg.

## 2019-05-22 ENCOUNTER — Ambulatory Visit (INDEPENDENT_AMBULATORY_CARE_PROVIDER_SITE_OTHER): Payer: HMO

## 2019-05-22 DIAGNOSIS — Z01818 Encounter for other preprocedural examination: Secondary | ICD-10-CM

## 2019-05-22 DIAGNOSIS — Z1159 Encounter for screening for other viral diseases: Secondary | ICD-10-CM

## 2019-05-24 LAB — COVID-19 (SARS-COV-2): SARS CoV 2 Overall Result: NOT DETECTED

## 2019-05-24 NOTE — Anesthesia Preprocedure Evaluation (Addendum)
Anesthesia Evaluation    AIRWAY    Mallampati: II    TM distance: >3 FB  Neck ROM: full  Mouth Opening:full  Planned to use difficult airway equipment: No CARDIOVASCULAR    cardiovascular exam normal       DENTAL           PULMONARY    pulmonary exam normal     OTHER FINDINGS                Relevant Problems   No relevant active problems       PSS Anesthesia Comments: GERD, claustrophobia, HTN, DVT LLL (2012); Labs-WNL; EKG-SR, unchanged from previous; Medical clearance-Dr. Roma Schanz.LS          Anesthesia Plan    ASA 2     general                                 informed consent obtained    Plan discussed with CRNA.    ECG reviewed               Signed by: Helaine Chess 05/24/19 2:00 PM

## 2019-05-24 NOTE — Pre-Procedure Instructions (Signed)
Day Before Surgery Confirmation Call     Spoke to: patient     Left Message: na     Confirmed     Location: [facility]    Arrival: 730    Surgery: 930     NPO instructions reviewed: MN, read back     Ambulatory Screening Tool:   Patient denies new symptoms of fever or respiratory symptoms.   Patient denies travel in the last month.   Patient denies being in close contact with a confirmed COVID19 patient.   Patient does not reside in a nursing home or care facility.      Visitor Restriction Guidelines Discussed with patient: All visitors entering [facility] will undergo a screening. Only one person may accompany a patient to the hospital.   Patient verbalized understanding and acceptance of above information.     Advise to call surgeon if need to cancel surgery arises.

## 2019-05-25 ENCOUNTER — Ambulatory Visit
Admission: RE | Admit: 2019-05-25 | Discharge: 2019-05-25 | Disposition: A | Discharge status: Home / Self Care | Payer: HMO | Source: Ambulatory Visit | Attending: Ophthalmology | Admitting: Ophthalmology

## 2019-05-25 ENCOUNTER — Encounter: Payer: Self-pay | Admitting: Ophthalmology

## 2019-05-25 ENCOUNTER — Encounter
Admission: RE | Disposition: A | Discharge status: Home / Self Care | Payer: Self-pay | Source: Ambulatory Visit | Attending: Ophthalmology

## 2019-05-25 ENCOUNTER — Ambulatory Visit: Payer: HMO | Admitting: Acute Care

## 2019-05-25 DIAGNOSIS — Z87891 Personal history of nicotine dependence: Secondary | ICD-10-CM | POA: Insufficient documentation

## 2019-05-25 DIAGNOSIS — I10 Essential (primary) hypertension: Secondary | ICD-10-CM | POA: Insufficient documentation

## 2019-05-25 DIAGNOSIS — H4440 Unspecified hypotony of eye: Secondary | ICD-10-CM | POA: Insufficient documentation

## 2019-05-25 DIAGNOSIS — K219 Gastro-esophageal reflux disease without esophagitis: Secondary | ICD-10-CM | POA: Insufficient documentation

## 2019-05-25 DIAGNOSIS — H409 Unspecified glaucoma: Secondary | ICD-10-CM | POA: Insufficient documentation

## 2019-05-25 DIAGNOSIS — Z7901 Long term (current) use of anticoagulants: Secondary | ICD-10-CM | POA: Insufficient documentation

## 2019-05-25 DIAGNOSIS — Z86718 Personal history of other venous thrombosis and embolism: Secondary | ICD-10-CM | POA: Insufficient documentation

## 2019-05-25 HISTORY — PX: REVISION, BLEB, SCLERAL PATCH GRAFT: SHX5423

## 2019-05-25 SURGERY — REVISION, BLEB, SCLERAL PATCH GRAFT
Anesthesia: Anesthesia General | Site: Eye | Laterality: Left | Wound class: Clean

## 2019-05-25 MED ORDER — ONDANSETRON HCL 4 MG/2ML IJ SOLN
INTRAMUSCULAR | Status: DC | PRN
Start: 2019-05-25 — End: 2019-05-25
  Administered 2019-05-25: 4 mg via INTRAVENOUS

## 2019-05-25 MED ORDER — PROMETHAZINE HCL 25 MG/ML IJ SOLN
6.2500 mg | Freq: Once | INTRAMUSCULAR | Status: DC | PRN
Start: 2019-05-25 — End: 2019-05-25

## 2019-05-25 MED ORDER — DEXAMETHASONE SOD PHOSPHATE PF 10 MG/ML IJ SOLN
INTRAMUSCULAR | Status: AC
Start: 2019-05-25 — End: ?
  Filled 2019-05-25: qty 1

## 2019-05-25 MED ORDER — NON FORMULARY
Status: DC | PRN
Start: 2019-05-25 — End: 2019-05-25
  Administered 2019-05-25: 11:00:00 .5 mL

## 2019-05-25 MED ORDER — AMMONIA AROMATIC IN INHA
1.0000 | Freq: Once | RESPIRATORY_TRACT | Status: DC | PRN
Start: 2019-05-25 — End: 2019-05-25

## 2019-05-25 MED ORDER — ERYTHROMYCIN 5 MG/GM OP OINT
TOPICAL_OINTMENT | OPHTHALMIC | Status: AC
Start: 2019-05-25 — End: ?
  Filled 2019-05-25: qty 1

## 2019-05-25 MED ORDER — MIDAZOLAM HCL 1 MG/ML IJ SOLN (WRAP)
INTRAMUSCULAR | Status: AC
Start: 2019-05-25 — End: ?
  Filled 2019-05-25: qty 2

## 2019-05-25 MED ORDER — LIDOCAINE HCL (PF) 2 % IJ SOLN
INTRAMUSCULAR | Status: DC | PRN
Start: 2019-05-25 — End: 2019-05-25
  Administered 2019-05-25: 100 mg via INTRAVENOUS

## 2019-05-25 MED ORDER — PREDNISOLONE ACETATE 1 % OP SUSP
OPHTHALMIC | Status: AC
Start: 2019-05-25 — End: ?
  Filled 2019-05-25: qty 5

## 2019-05-25 MED ORDER — BUPIVACAINE HCL (PF) 0.75 % IJ SOLN
INTRAMUSCULAR | Status: DC | PRN
Start: 2019-05-25 — End: 2019-05-25

## 2019-05-25 MED ORDER — ONDANSETRON HCL 4 MG/2ML IJ SOLN
4.0000 mg | Freq: Once | INTRAMUSCULAR | Status: DC | PRN
Start: 2019-05-25 — End: 2019-05-25

## 2019-05-25 MED ORDER — ONDANSETRON HCL 4 MG/2ML IJ SOLN
INTRAMUSCULAR | Status: AC
Start: 2019-05-25 — End: ?
  Filled 2019-05-25: qty 2

## 2019-05-25 MED ORDER — DEXAMETHASONE SODIUM PHOSPHATE 4 MG/ML IJ SOLN (WRAP)
INTRAMUSCULAR | Status: DC | PRN
Start: 2019-05-25 — End: 2019-05-25
  Administered 2019-05-25: 4 mg via INTRAVENOUS

## 2019-05-25 MED ORDER — PROPOFOL INFUSION 10 MG/ML
INTRAVENOUS | Status: DC | PRN
Start: 2019-05-25 — End: 2019-05-25
  Administered 2019-05-25: 150 mg via INTRAVENOUS
  Administered 2019-05-25: 50 mg via INTRAVENOUS

## 2019-05-25 MED ORDER — LIDOCAINE-PHENYLEPHRINE-BSS 1-1.5 % (1ML) IO SOSY
PREFILLED_SYRINGE | INTRAOCULAR | Status: AC
Start: 2019-05-25 — End: ?
  Filled 2019-05-25: qty 1

## 2019-05-25 MED ORDER — FENTANYL CITRATE (PF) 50 MCG/ML IJ SOLN (WRAP)
INTRAMUSCULAR | Status: AC
Start: 2019-05-25 — End: ?
  Filled 2019-05-25: qty 2

## 2019-05-25 MED ORDER — DEXAMETHASONE SODIUM PHOSPHATE 4 MG/ML IJ SOLN
INTRAMUSCULAR | Status: AC
Start: 2019-05-25 — End: ?
  Filled 2019-05-25: qty 1

## 2019-05-25 MED ORDER — LIDOCAINE HCL (PF) 2 % IJ SOLN
INTRAMUSCULAR | Status: DC | PRN
Start: 2019-05-25 — End: 2019-05-25
  Administered 2019-05-25: 1 mL

## 2019-05-25 MED ORDER — CEFAZOLIN SODIUM 1 G IJ SOLR
INTRAMUSCULAR | Status: AC
Start: 2019-05-25 — End: ?
  Filled 2019-05-25: qty 1000

## 2019-05-25 MED ORDER — ATROPINE SULFATE 1 % OP SOLN
OPHTHALMIC | Status: DC | PRN
Start: 2019-05-25 — End: 2019-05-25
  Administered 2019-05-25: 1 [drp] via OPHTHALMIC

## 2019-05-25 MED ORDER — OFLOXACIN 0.3 % OP SOLN
OPHTHALMIC | Status: AC
Start: 2019-05-25 — End: ?
  Filled 2019-05-25: qty 5

## 2019-05-25 MED ORDER — FLUORESCEIN SODIUM 1 MG OP STRP
ORAL_STRIP | OPHTHALMIC | Status: AC
Start: 2019-05-25 — End: ?
  Filled 2019-05-25: qty 1

## 2019-05-25 MED ORDER — MIDAZOLAM HCL 1 MG/ML IJ SOLN (WRAP)
INTRAMUSCULAR | Status: DC | PRN
Start: 2019-05-25 — End: 2019-05-25
  Administered 2019-05-25: 2 mg via INTRAVENOUS

## 2019-05-25 MED ORDER — BUPIVACAINE HCL (PF) 0.75 % IJ SOLN
INTRAMUSCULAR | Status: AC
Start: 2019-05-25 — End: ?
  Filled 2019-05-25: qty 10

## 2019-05-25 MED ORDER — ACETAMINOPHEN 325 MG PO TABS
650.0000 mg | ORAL_TABLET | Freq: Once | ORAL | Status: AC
Start: 2019-05-25 — End: 2019-05-25

## 2019-05-25 MED ORDER — HYDROCODONE-ACETAMINOPHEN 5-325 MG PO TABS
1.0000 | ORAL_TABLET | Freq: Once | ORAL | Status: DC | PRN
Start: 2019-05-25 — End: 2019-05-25

## 2019-05-25 MED ORDER — SODIUM CHLORIDE (PF) 0.9 % IJ SOLN
INTRAMUSCULAR | Status: AC
Start: 2019-05-25 — End: ?
  Filled 2019-05-25: qty 10

## 2019-05-25 MED ORDER — TETRACAINE HCL 0.5 % OP SOLN
OPHTHALMIC | Status: AC
Start: 2019-05-25 — End: ?
  Filled 2019-05-25: qty 4

## 2019-05-25 MED ORDER — DEXAMETHASONE SOD PHOSPHATE PF 10 MG/ML IJ SOLN
INTRAMUSCULAR | Status: DC | PRN
Start: 2019-05-25 — End: 2019-05-25
  Administered 2019-05-25: 5 mg

## 2019-05-25 MED ORDER — NA CHONDROIT SULF-NA HYALURON 40-30 MG/ML IO SOLN
INTRAOCULAR | Status: DC | PRN
Start: 2019-05-25 — End: 2019-05-25
  Administered 2019-05-25: .5 mL via INTRAOCULAR

## 2019-05-25 MED ORDER — FENTANYL CITRATE (PF) 50 MCG/ML IJ SOLN (WRAP)
25.0000 ug | INTRAMUSCULAR | Status: DC | PRN
Start: 2019-05-25 — End: 2019-05-25
  Administered 2019-05-25 (×3): 25 ug via INTRAVENOUS
  Filled 2019-05-25: qty 2

## 2019-05-25 MED ORDER — PHENYLEPHRINE 100 MCG/ML IV SOSY (WRAP)
PREFILLED_SYRINGE | INTRAVENOUS | Status: DC | PRN
Start: 2019-05-25 — End: 2019-05-25
  Administered 2019-05-25 (×2): 100 ug via INTRAVENOUS

## 2019-05-25 MED ORDER — ATROPINE SULFATE 1 % OP SOLN
OPHTHALMIC | Status: AC
Start: 2019-05-25 — End: ?
  Filled 2019-05-25: qty 5

## 2019-05-25 MED ORDER — HYDROMORPHONE HCL 0.5 MG/0.5 ML IJ SOLN
0.5000 mg | INTRAMUSCULAR | Status: DC | PRN
Start: 2019-05-25 — End: 2019-05-25

## 2019-05-25 MED ORDER — LIDOCAINE-EPINEPHRINE 2 %-1:100000 IJ SOLN
INTRAMUSCULAR | Status: AC
Start: 2019-05-25 — End: ?
  Filled 2019-05-25: qty 1.7

## 2019-05-25 MED ORDER — ERYTHROMYCIN 5 MG/GM OP OINT
TOPICAL_OINTMENT | OPHTHALMIC | Status: DC | PRN
Start: 2019-05-25 — End: 2019-05-25
  Administered 2019-05-25: 1 g via OPHTHALMIC

## 2019-05-25 MED ORDER — LIDOCAINE HCL (PF) 2 % IJ SOLN
INTRAMUSCULAR | Status: AC
Start: 2019-05-25 — End: ?
  Filled 2019-05-25: qty 5

## 2019-05-25 MED ORDER — PREDNISOLONE ACETATE 1 % OP SUSP
OPHTHALMIC | Status: DC | PRN
Start: 2019-05-25 — End: 2019-05-25
  Administered 2019-05-25: 1 [drp] via OPHTHALMIC

## 2019-05-25 MED ORDER — BSS IO SOLN
INTRAOCULAR | Status: DC | PRN
Start: 2019-05-25 — End: 2019-05-25
  Administered 2019-05-25: 15 mL via TOPICAL

## 2019-05-25 MED ORDER — FENTANYL CITRATE (PF) 50 MCG/ML IJ SOLN (WRAP)
INTRAMUSCULAR | Status: DC | PRN
Start: 2019-05-25 — End: 2019-05-25
  Administered 2019-05-25 (×2): 25 ug via INTRAVENOUS
  Administered 2019-05-25: 50 ug via INTRAVENOUS

## 2019-05-25 MED ORDER — OFLOXACIN 0.3 % OP SOLN
OPHTHALMIC | Status: DC | PRN
Start: 2019-05-25 — End: 2019-05-25
  Administered 2019-05-25: 1 [drp]

## 2019-05-25 MED ORDER — ACETAMINOPHEN 325 MG PO TABS
ORAL_TABLET | ORAL | Status: AC
Start: 2019-05-25 — End: 2019-05-25
  Administered 2019-05-25: 12:00:00 650 mg via ORAL
  Filled 2019-05-25: qty 2

## 2019-05-25 MED ORDER — LACTATED RINGERS IV SOLN
INTRAVENOUS | Status: DC | PRN
Start: 2019-05-25 — End: 2019-05-25

## 2019-05-25 MED ORDER — CEFAZOLIN SODIUM 1 G IJ SOLR
INTRAMUSCULAR | Status: DC | PRN
Start: 2019-05-25 — End: 2019-05-25
  Administered 2019-05-25: 50 mg via SUBCONJUNCTIVAL

## 2019-05-25 MED ORDER — FLUORESCEIN SODIUM 1 MG OP STRP
ORAL_STRIP | OPHTHALMIC | Status: DC | PRN
Start: 2019-05-25 — End: 2019-05-25
  Administered 2019-05-25: 1 via OPHTHALMIC

## 2019-05-25 MED ORDER — MEPERIDINE HCL 25 MG/ML IJ SOLN
12.5000 mg | INTRAMUSCULAR | Status: DC | PRN
Start: 2019-05-25 — End: 2019-05-25

## 2019-05-25 SURGICAL SUPPLY — 24 items
DRAPE EQUIPMENT SHOWER CUP (Drape) ×1 IMPLANT
DRAPE FOR ZEISS RESIGHT (Drape) ×1
EYE PAD CURITY 1-5/8X2-5/8IN (Dressing) ×1
EYE SHIELD SOFT BLUE RUBBER (Personal Protection) ×1
GLOVE 7.5 PI ULTRA TOUCH (Glove) ×1
GLOVE SURGICAL 7 1/2 BIOGEL PI (Glove) ×1
GLOVE SURGICAL 7 1/2 BIOGEL PI ULTRATOUCH G POWDER FREE ROUGH BEAD (Glove) ×1 IMPLANT
PAD EYE L2 5/8 IN X W2 1/8 IN (Dressing) ×1 IMPLANT
RESTRAINT EXTREMITY L36 IN REGULAR SOFT (Patient Supply) ×1
RESTRAINT EXTREMITY L36 IN REGULAR SOFT QUILT CUFF QUICK RELEASE (Patient Supply) ×1 IMPLANT
RESTRAINT LIMB HOLDER (Patient Supply) ×1
SHIELD OPHTHALMIC UNIVERSAL FLEXIBLE (Personal Protection) ×1
SHIELD OPHTHALMIC UNIVERSAL FLEXIBLE EDGE SOFGUARD THERMOPLASTIC (Personal Protection) ×1 IMPLANT
SUTURE COATED VICRYL 7-0 TG160-8 L18 IN (Suture) ×1
SUTURE COATED VICRYL 7-0 TG160-8 L18 IN 2 ARM BRAID COATED VIOLET (Suture) ×1 IMPLANT
SUTURE ETHILON 10-0 TG16 12IN (Suture) ×2
SUTURE NYLON ETHILON BLACK 10-0 CS160-8 (Suture) ×2
SUTURE NYLON ETHILON BLCK 10-0 CS160-8 L12IN 2 ARM MONOFLMENT NNBSRBBL (Suture) ×2 IMPLANT
SUTURE VICRYL 7-0 TG1608 18IN (Suture) ×1
TAPE MICROPRE NLTX 1INX1.5YD (Tape) ×1
TAPE SURGICAL L1 1/2 YD X W1 IN (Tape) ×1
TAPE SURGICAL L1 1/2 YD X W1 IN HYPOALLERGENIC BREATHABLE MICROPORE (Tape) ×1 IMPLANT
WATER STERILE PLASTIC POUR BOTTLE 250 ML (Irrigation Solutions) ×1 IMPLANT
WATER STRL IRRIG 250ML BTL (Irrigation Solutions) ×1

## 2019-05-25 NOTE — Transfer of Care (Signed)
Anesthesia Transfer of Care Note    Patient: [name]    Procedures performed: Procedure(s):  REVISION, BLEB    Anesthesia type: General LMA    Patient location:Phase I PACU    Last vitals:   Vitals:    05/25/19 1131   BP: 143/85   Pulse: 85   Resp: 14   Temp:    SpO2: 100%       Post pain: Patient not complaining of pain, continue current therapy      Mental Status:awake    Respiratory Function: tolerating nasal cannula    Cardiovascular: stable    Nausea/Vomiting: patient not complaining of nausea or vomiting    Hydration Status: adequate    Post assessment: no apparent anesthetic complications    Signed by: Delena Serve  05/25/19 11:39 AM

## 2019-05-25 NOTE — Anesthesia Postprocedure Evaluation (Signed)
Anesthesia Post Evaluation    Patient: [name]    Procedure(s):  REVISION, BLEB    Anesthesia type: general    Last Vitals:   Vitals Value Taken Time   BP 124/71 05/25/19 1230   Temp 36.7 C (98 F) 05/25/19 1150   Pulse 79 05/25/19 1230   Resp 18 05/25/19 1230   SpO2 99 % 05/25/19 1230                 Anesthesia Post Evaluation:     Patient Evaluated: PACU  Patient Participation: complete - patient participated  Level of Consciousness: awake  Pain Score: 1  Pain Management: adequate    Airway Patency: patent    Anesthetic complications: No      PONV Status: none    Cardiovascular status: acceptable  Respiratory status: acceptable  Hydration status: acceptable        Signed by: Tye [place], 05/25/2019 1:49 PM

## 2019-05-25 NOTE — Op Note (Signed)
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  The Left eye was prepped and draped in usual sterile fashion for ophthalmic  surgery. A lid speculum was placed. A 7-0 vicryl clear corneal traction suture was placed superiorly. Paracentesis was created with a super sharp blade. Intracameral BSS was administered and a nice elevation of the bleb was noted nasally.  A total of 7 interrupted 10-0 nylon sutures were used in transconjunctival fashion to limit flow in radial fashion with episcleral bite taken during suture pass.  The middle suture was noted to limit the most flow and then nasal sutures were used to decrease the bleb height and to delimit the bleb.  Subconjunctival injection of Ancef and Decadron was given. The eye was returned to IOP in mid teens.  No wound leaks were found.  A drop of ofloxacin, prednisolone, Atropine and antibiotic ointment followed by shield.  Patient was escorted to the recovery room in stable condition.    Given patient's anxiety and request, LMA general anesthesia was given.    Estimated Blood Loss:        Specimens:       Complications:   * No complications entered in OR log *      Signed by Lamount Cranker

## 2019-05-25 NOTE — H&P (Signed)
HISTORY AND PHYSICAL      Today's Date: 05/25/2019   Patient Name: [name]  Attending Physician: Shela Leff I, MD    Chief Complaint: Chronic Hypotony Left eye     History of Present Illness:   [name] is a 58 y.o. female who  has a past medical history of Chronic ulcerative colitis, Claustrophobia, Clotting disorder, Deep venous thrombosis of calf (2012), GERD (gastroesophageal reflux disease), Glaucoma NEC, Hypertensive disorder, Ovarian cyst, right, Plantar fasciitis, and Seasonal allergies..  This patient presents to the hospital today to undergo elective surgery due to chronic hypotony left eye after prior filtration surgery.      Past Medical History:     Past Medical History:   Diagnosis Date    Chronic ulcerative colitis     nio issues for long time    Claustrophobia     Clotting disorder     DVT LLE 2012    Deep venous thrombosis of calf 2012    LLL/on xeralto//    GERD (gastroesophageal reflux disease)     Glaucoma NEC     bilat    Hypertensive disorder     controlled well with medication    Ovarian cyst, right     Plantar fasciitis     Seasonal allergies        Past Surgical History:     Past Surgical History:   Procedure Laterality Date    BUNIONECTOMY      left foot    EYE SURGERY Bilateral 2013    laser surgery for glaucoma, bilateral trabeculectomy    HYSTERECTOMY      removed both ovaries and tubes    revision of hysterectomyl suture line      TONSILLECTOMY      TRABECULECTOMY Left 03/22/2014    Procedure: TRABECULECTOMY;  Surgeon: Pecola Leisure, MD;  Location: [facility];  Service: Ophthalmology;  Laterality: Left;  TRABECULECTOMY REVISION, LEFT EYE  Q1=N  ANES=MAC  EQUIP=MITOMYCIN C, 0.4mg /cc, DISPENSE 5cc (ORDER TO FOLLOW)  MD REQ 30 MIN  ALLERGIES=COSOPT, AZOPT, ALPHAGAN  **Need RESIGHT SET from SPD    ureterectomy left         Family History:     Family History   Problem Relation Age of Onset    Hypertension Father     Vision loss Father      Vision loss Brother     Hypertension Paternal Aunt     Vision loss Paternal Uncle        Social History:     Social History     Socioeconomic History    Marital status: Married     Spouse name: Not on file    Number of children: Not on file    Years of education: Not on file    Highest education level: Not on file   Occupational History    Not on file   Social Needs    Financial resource strain: Not on file    Food insecurity     Worry: Not on file     Inability: Not on file    Transportation needs     Medical: Not on file     Non-medical: Not on file   Tobacco Use    Smoking status: Former Smoker     Quit date: 12/21/2010     Years since quitting: 8.4    Smokeless tobacco: Never Used   Substance and Sexual Activity    Alcohol use: Yes  Alcohol/week: 1.0 standard drinks     Types: 1 Cans of beer per week     Comment: 1/week    Drug use: No    Sexual activity: Yes     Partners: Male     Birth control/protection: Surgical   Lifestyle    Physical activity     Days per week: Not on file     Minutes per session: Not on file    Stress: Not on file   Relationships    Social connections     Talks on phone: Not on file     Gets together: Not on file     Attends religious service: Not on file     Active member of club or organization: Not on file     Attends meetings of clubs or organizations: Not on file     Relationship status: Not on file    Intimate partner violence     Fear of current or ex partner: Not on file     Emotionally abused: Not on file     Physically abused: Not on file     Forced sexual activity: Not on file   Other Topics Concern    Not on file   Social History Narrative    Not on file       Allergies:     Allergies   Allergen Reactions    Latex Itching    Azopt [Brinzolamide] Other (See Comments)     Not sure of reaction    Brimonidine Tartrate Other (See Comments)     Eyes get red       Medications:     Prior to Admission medications    Medication Sig Start Date End Date Taking?  Authorizing Provider   acetaminophen (TYLENOL) 500 MG tablet Take 1,000 mg by mouth.   Yes [provider]   Artificial Tear Ointment (ARTIFICIAL TEARS) ointment Place 2 application into both eyes nightly as needed      Yes [provider]   desloratadine (CLARINEX) 5 MG tablet Take 5 mg by mouth daily.   Yes [provider]   dexlansoprazole (Dexilant) 60 MG capsule Take 60 mg by mouth daily   Yes [provider]   diclofenac (VOLTAREN) 75 MG EC tablet Take 75 mg by mouth as needed   Yes [provider]   hydrocortisone 2.5 % cream APPLY TO AFFECTED AREA TWICE DAILY 10/31/18  Yes [provider]   metoprolol (LOPRESSOR) 100 MG tablet Take 100 mg by mouth daily.   Yes [provider]   rivaroxaban (Xarelto) 10 MG Tab Take 20 mg by mouth daily      Yes [provider]   ibuprofen (ADVIL) 800 MG tablet TAKE 1 TABLET BY MOUTH TWICE A DAY AFTER,MEALS 08/25/18   [provider]   InFLIXimab (REMICADE IV) Inject into the vein. Every three months last dose 02/18/12    [provider]   vitamin D, ergocalciferol, (DRISDOL) 50000 UNIT Cap TAKE 1 CAPSULE BY MOUTH ONE TIME PER WEEK 04/14/19   [provider]       Review of Systems:   As per the HPI.  The patient denies any additional changes to their otic, opthalmologic, dermatologic, pulmonary, cardiac, gastrointestinal, genitourinary, musculoskeletal, hematologic, constitutional, or psychiatric systems.    Physical Exam:     Vitals:    05/25/19 0814   BP: (!) 143/93   Pulse: 99   Resp: 16   Temp: 97.4  F (36.3 C)   SpO2: 99%     General appearance - alert, well appearing, and in no distress  Chest - clear to auscultation, no wheezes, rales or rhonchi, symmetric air entry  Heart - normal rate, regular rhythm, normal S1, S2, no murmurs, rubs, clicks or gallops  Abdomen - soft, nontender, nondistended, no masses or organomegaly  Extremities - peripheral pulses normal, no pedal  edema, no clubbing or cyanosis    Labs:     Results     ** No results found for the last 24 hours. **          Rads:     Radiology Results (24 Hour)     ** No results found for the last 24 hours. 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 RBA d/w patient including risk of no improvement in vision and increased IOP after surgery. She elects to proceed.  Given anxiety, patient elects for GA.       Signed by: Lamount Cranker, MD

## 2019-05-25 NOTE — Discharge Instr - AVS First Page (Addendum)
Discharge Instructions for Glaucoma Patients     Recommend Continuous Head Elevation: 45 degrees / 2-3 pillows to elevate head.   Rest today -You may experience dizziness or sleepiness.  Do not drive home. You should not operate a motor vehicle, machinery or power tools for at least 24 hours. DO NOT stay alone for 24 hours.    Continue using all drops that you were using in the un-operated eye before surgery.    Take oral acetazolamide today at 6pm (sooner if any signs of ocular pain and pressure) and can repeat every 6 hours if discomfort.  If no discomfort after initial dose this evening, you can take one dose tomorrow morning prior to seeing the doctor.     Remove the patch when you wake up tomorrow morning.  After removing the patch, place one drop of the antibiotic drop (tan top) in the operated eye followed by the Prednisolone Acetate eye drop (Pink Top) and then the Atropine drop (Red Top). Please remember to wait 5 minutes between each of these drops.    Use Acetaminophen (Tylenol) for pain if needed. Take 2 tablets (325mg  or 500mg ) every 6 hours as needed.   If you have severe pain that is not relieved with Acetaminophen, please call the doctor.  Please refrain from any exertion, including bending, straining, lifting greater than 10 pounds, hair salons, dental procedures and sexual activity for at least 3 weeks or as otherwise instructed by the Doctor.    The following are common symptoms after surgery:  ? Feeling as if something is in your eye  ? Blurred vision  ? Redness of the eye  ? Bruising of the skin of the eyelid.  ? A bubbleor clear area at the top of the colored part of eye (For glaucoma surgery)    Please bring all eye drops and eye shield with you to your post-op appointment.     If you have a question or an emergency, call our office / answering service:  [phone]       Post Anesthesia Discharge Instructions    Although you may be awake and alert in the recovery room, small amounts of  anesthetic remain in your system for about 24 hours.  You may feel tired and sleepy during this time.      You are advised to go directly home from the hospital.    Plan to stay at home and rest for the remainder of the day.    It is advisable to have someone with you at home for 24 hours after surgery.    Do not operate a motor vehicle, or any mechanical or electrical equipment for the next 24 hours.      Be careful when you are walking around, you may become dizzy.  The effects of anesthesia and/or medications are still present and drowsiness may occur    Do not consume alcohol, tranquilizers, sleeping medications, or any other non prescribed medication for the remainder of the day.    Diet:  begin with liquids, progress your diet as tolerated or as directed by your surgeon.  Nausea and vomiting may occur in the next 24 hours.

## 2019-05-26 ENCOUNTER — Encounter: Payer: Self-pay | Admitting: Ophthalmology

## 2019-12-13 ENCOUNTER — Other Ambulatory Visit: Payer: Self-pay | Admitting: Otolaryngic Allergy

## 2019-12-13 DIAGNOSIS — R221 Localized swelling, mass and lump, neck: Secondary | ICD-10-CM

## 2019-12-21 ENCOUNTER — Ambulatory Visit: Payer: Self-pay

## 2020-01-09 ENCOUNTER — Ambulatory Visit
Admission: RE | Admit: 2020-01-09 | Discharge: 2020-01-09 | Disposition: A | Discharge status: Home / Self Care | Payer: Self-pay | Source: Ambulatory Visit | Attending: Internal Medicine | Admitting: Internal Medicine

## 2020-01-09 ENCOUNTER — Other Ambulatory Visit: Payer: Self-pay | Admitting: Internal Medicine

## 2020-01-09 DIAGNOSIS — M7989 Other specified soft tissue disorders: Secondary | ICD-10-CM | POA: Insufficient documentation

## 2020-01-12 ENCOUNTER — Emergency Department
Admission: EM | Admit: 2020-01-12 | Discharge: 2020-01-12 | Disposition: A | Discharge status: Home / Self Care | Payer: HMO | Attending: Emergency Medical Services | Admitting: Emergency Medical Services

## 2020-01-12 ENCOUNTER — Emergency Department: Payer: HMO

## 2020-01-12 DIAGNOSIS — Z791 Long term (current) use of non-steroidal anti-inflammatories (NSAID): Secondary | ICD-10-CM | POA: Insufficient documentation

## 2020-01-12 DIAGNOSIS — L03116 Cellulitis of left lower limb: Secondary | ICD-10-CM | POA: Insufficient documentation

## 2020-01-12 DIAGNOSIS — I10 Essential (primary) hypertension: Secondary | ICD-10-CM | POA: Insufficient documentation

## 2020-01-12 DIAGNOSIS — Z86718 Personal history of other venous thrombosis and embolism: Secondary | ICD-10-CM | POA: Insufficient documentation

## 2020-01-12 DIAGNOSIS — Z87891 Personal history of nicotine dependence: Secondary | ICD-10-CM | POA: Insufficient documentation

## 2020-01-12 DIAGNOSIS — Z7901 Long term (current) use of anticoagulants: Secondary | ICD-10-CM | POA: Insufficient documentation

## 2020-01-12 LAB — CBC AND DIFFERENTIAL
Absolute NRBC: 0 10*3/uL (ref 0.00–0.00)
Basophils Absolute Automated: 0.02 10*3/uL (ref 0.00–0.08)
Basophils Automated: 0.3 %
Eosinophils Absolute Automated: 0.06 10*3/uL (ref 0.00–0.44)
Eosinophils Automated: 0.8 %
Hematocrit: 33.3 % — ABNORMAL LOW (ref 34.7–43.7)
Hgb: 11.3 g/dL — ABNORMAL LOW (ref 11.4–14.8)
Immature Granulocytes Absolute: 0.02 10*3/uL (ref 0.00–0.07)
Immature Granulocytes: 0.3 %
Lymphocytes Absolute Automated: 1.67 10*3/uL (ref 0.42–3.22)
Lymphocytes Automated: 23.2 %
MCH: 35.3 pg — ABNORMAL HIGH (ref 25.1–33.5)
MCHC: 33.9 g/dL (ref 31.5–35.8)
MCV: 104.1 fL — ABNORMAL HIGH (ref 78.0–96.0)
MPV: 9.6 fL (ref 8.9–12.5)
Monocytes Absolute Automated: 0.75 10*3/uL (ref 0.21–0.85)
Monocytes: 10.4 %
Neutrophils Absolute: 4.68 10*3/uL (ref 1.10–6.33)
Neutrophils: 65 %
Nucleated RBC: 0 /100 WBC (ref 0.0–0.0)
Platelets: 286 10*3/uL (ref 142–346)
RBC: 3.2 10*6/uL — ABNORMAL LOW (ref 3.90–5.10)
RDW: 13 % (ref 11–15)
WBC: 7.2 10*3/uL (ref 3.10–9.50)

## 2020-01-12 LAB — BASIC METABOLIC PANEL
Anion Gap: 12 (ref 5.0–15.0)
BUN: 9 mg/dL (ref 7–19)
CO2: 20 mEq/L — ABNORMAL LOW (ref 22–29)
Calcium: 9.1 mg/dL (ref 8.5–10.5)
Chloride: 107 mEq/L (ref 100–111)
Creatinine: 0.8 mg/dL (ref 0.6–1.0)
Glucose: 103 mg/dL — ABNORMAL HIGH (ref 70–100)
Potassium: 4.6 mEq/L (ref 3.5–5.1)
Sodium: 139 mEq/L (ref 136–145)

## 2020-01-12 LAB — GFR: EGFR: >60

## 2020-01-12 MED ORDER — AMOXICILLIN-POT CLAVULANATE 500-125 MG PO TABS
1.00 | ORAL_TABLET | Freq: Three times a day (TID) | ORAL | 0 refills | Status: AC
Start: 2020-01-12 — End: 2020-01-19

## 2020-01-12 MED ORDER — CLINDAMYCIN HCL 300 MG PO CAPS
300.00 mg | ORAL_CAPSULE | Freq: Three times a day (TID) | ORAL | 0 refills | Status: AC
Start: 2020-01-12 — End: 2020-01-19

## 2020-01-12 MED ORDER — KETOROLAC TROMETHAMINE 15 MG/ML IJ SOLN
15.00 mg | Freq: Once | INTRAMUSCULAR | Status: AC
Start: 2020-01-12 — End: 2020-01-12
  Administered 2020-01-12: 14:00:00 15 mg via INTRAVENOUS
  Filled 2020-01-12: qty 1

## 2020-01-12 MED ORDER — STERILE WATER FOR INJECTION IJ SOLN
4.50 g | Freq: Once | INTRAMUSCULAR | Status: AC
Start: 2020-01-12 — End: 2020-01-12
  Administered 2020-01-12: 15:00:00 4.5 g via INTRAVENOUS
  Filled 2020-01-12: qty 20

## 2020-01-12 NOTE — ED Provider Notes (Signed)
[facility]    EMERGENCY DEPARTMENT HISTORY AND PHYSICAL EXAM    Date: 01/12/2020  Patient Name: [name]  Attending Physician: Lucille Passy, MD  Patient DOB:  [birth year]  MRN:  [id number]  Room:  30/SS 30    Patient was evaluated by ED physician, Dr Lourdes Sledge, at 1:51 PM    History     Chief Complaint   Patient presents with    Leg Swelling          The patient [name], is a 59 y.o. female who presents with history of DVT in 2012 with a hypercoagulable state on Xarelto who had ultrasound on the 20th of the month 3 days ago that was negative for DVT who continues to have pain and swelling in the left lower extremity despite being on oral cefdinir written by her primary care doctor.  She was sent back to the emergency department by her primary care doctor as she continued to have swelling.  She has a rash to the lower extremity.  There is no fever or chills.  There is no nausea vomiting.  Her energy level is normal.  No chest pain.  PCP:  Daisey Must D, MD      Past Medical History       Past Medical History:   Diagnosis Date    Chronic ulcerative colitis     nio issues for long time    Claustrophobia     Clotting disorder     DVT LLE 2012    Deep venous thrombosis of calf 2012    LLL/on xeralto//    GERD (gastroesophageal reflux disease)     Glaucoma NEC     bilat    Hypertensive disorder     controlled well with medication    Ovarian cyst, right     Plantar fasciitis     Seasonal allergies          Past Surgical History       Past Surgical History:   Procedure Laterality Date    BUNIONECTOMY      left foot    CATARACT EXTRACTION      EYE SURGERY Bilateral 2013    laser surgery for glaucoma, bilateral trabeculectomy    GLAUCOMA SURGERY      HYSTERECTOMY      removed both ovaries and tubes    revision of hysterectomyl suture line      REVISION, BLEB, SCLERAL PATCH GRAFT Left 05/25/2019    Procedure: REVISION, BLEB;  Surgeon: Lamount Cranker, MD;   Location: [facility];  Service: Ophthalmology;  Laterality: Left;    TONSILLECTOMY      TRABECULECTOMY Left 03/22/2014    Procedure: TRABECULECTOMY;  Surgeon: Pecola Leisure, MD;  Location: [facility];  Service: Ophthalmology;  Laterality: Left;  TRABECULECTOMY REVISION, LEFT EYE  Q1=N  ANES=MAC  EQUIP=MITOMYCIN C, 0.4mg /cc, DISPENSE 5cc (ORDER TO FOLLOW)  MD REQ 30 MIN  ALLERGIES=COSOPT, AZOPT, ALPHAGAN  **Need RESIGHT SET from SPD    ureterectomy left           Family History    Family History   Problem Relation Age of Onset    Hypertension Father     Vision loss Father     Vision loss Brother     Hypertension Paternal Aunt     Vision loss Paternal Uncle        Social History    Social History  Socioeconomic History    Marital status: Married     Spouse name: None    Number of children: None    Years of education: None    Highest education level: None   Occupational History    None   Tobacco Use    Smoking status: Former Smoker     Quit date: 12/21/2010     Years since quitting: 9.0    Smokeless tobacco: Never Used   Vaping Use    Vaping Use: Never used   Substance and Sexual Activity    Alcohol use: Yes     Alcohol/week: 1.0 standard drinks     Types: 1 Cans of beer per week     Comment: 1/week    Drug use: No    Sexual activity: Yes     Partners: Male     Birth control/protection: Surgical   Other Topics Concern    None   Social History Narrative    None     Social Determinants of Health     Financial Resource Strain:     Difficulty of Paying Living Expenses:    Food Insecurity:     Worried About Programme researcher, broadcasting/film/video in the Last Year:     Barista in the Last Year:    Transportation Needs:     Freight forwarder (Medical):     Lack of Transportation (Non-Medical):    Physical Activity:     Days of Exercise per Week:     Minutes of Exercise per Session:    Stress:     Feeling of Stress :    Social Connections:     Frequency of Communication with Friends and  Family:     Frequency of Social Gatherings with Friends and Family:     Attends Religious Services:     Active Member of Clubs or Organizations:     Attends Banker Meetings:     Marital Status:    Intimate Partner Violence:     Fear of Current or Ex-Partner:     Emotionally Abused:     Physically Abused:     Sexually Abused:        Allergies    Allergies   Allergen Reactions    Latex Itching    Azopt [Brinzolamide] Other (See Comments)     Not sure of reaction    Brimonidine Tartrate Other (See Comments)     Eyes get red         Current/Home Medications    Current/Home Medications    ACETAMINOPHEN (TYLENOL) 500 MG TABLET    Take 1,000 mg by mouth.    ARTIFICIAL TEAR OINTMENT (ARTIFICIAL TEARS) OINTMENT    Place 2 application into both eyes nightly as needed       DESLORATADINE (CLARINEX) 5 MG TABLET    Take 5 mg by mouth daily.    DEXLANSOPRAZOLE (DEXILANT) 60 MG CAPSULE    Take 60 mg by mouth daily    DICLOFENAC (VOLTAREN) 75 MG EC TABLET    Take 75 mg by mouth as needed    HYDROCORTISONE 2.5 % CREAM    APPLY TO AFFECTED AREA TWICE DAILY    IBUPROFEN (ADVIL) 800 MG TABLET    TAKE 1 TABLET BY MOUTH TWICE A DAY AFTER,MEALS    INFLIXIMAB (REMICADE IV)    Inject into the vein. Every three months last dose 02/18/12    METOPROLOL (LOPRESSOR) 100 MG TABLET    Take  100 mg by mouth daily.    RIVAROXABAN (XARELTO) 10 MG TAB    Take 20 mg by mouth daily       VITAMIN D, ERGOCALCIFEROL, (DRISDOL) 50000 UNIT CAP    TAKE 1 CAPSULE BY MOUTH ONE TIME PER WEEK       Vital Signs     BP (!) 139/92    Pulse 81    Temp 98.4 F (36.9 C) (Oral)    Resp 18    Wt 63.6 kg    SpO2 99%    BMI 24.84 kg/m   Patient Vitals for the past 24 hrs:   BP Temp Temp src Pulse Resp SpO2 Weight   01/12/20 1510 (!) 139/92   81  99 %    01/12/20 1254 135/90 98.4 F (36.9 C) Oral 85 18 99 %    01/12/20 1252       63.6 kg         Review of Systems     Review of Systems   Constitutional: Negative for chills and fever.    Respiratory: Negative for shortness of breath.    Cardiovascular: Negative for chest pain.   Gastrointestinal: Negative for nausea and vomiting.   All other systems reviewed and are negative.      Physical Exam          CONSTITUTIONAL   Vital signs reviewed, Patient appears comfortable, Alert and oriented X 3.  HEAD   Atraumatic, Normocephalic.  EYES   Eyes are normal to inspection, Pupils equal, round and reactive to light, No discharge from eyes, Extraocular muscles intact, Sclera are normal, Conjunctiva normal.  ENT Mouth normal to Inspection.  NECK   Normal ROM. No jugular venous distention, No meningeal signs.  RESPIRATORY Chest is nontender, Breath sounds normal, No respiratory distress  CARDIOVASCULAR   RRR, No murmurs, Normal S1 S2, Rhythm is normal.  ABDOMEN   Abdomen is nontender, No masses, Bowel sounds normal, No distension, No peritoneal signs.  BACK  There is no CVA Tenderness, Normal inspection.  UPPER EXTREMITY   Inspection normal, No cyanosis, No edema.  LOWER EXTREMITY   Inspection normal, No cyanosis, there is 2+ pitting edema to left lower extremity.  There is erythema to the pretibial region and in the calf that extends up to the distal one quarter of the hamstrings.  The erythema is blanchable.  There is no areas of fluctuance.  NEURO   GCS is 15, Speech normal. 5/5 ms and Nl sensation B. No cerebellar deficits. Nl cranial exam  SKIN Skin is warm, Skin is dry, Skin is normal color.  LYMPHATIC   No adenopathy in neck.  PSYCHIATRIC Oriented X 3, Normal affect.         ED Medication Orders     ED Medication Orders (From admission, onward)    Start Ordered     Status Ordering Provider    01/12/20 1521 01/12/20 1521  piperacillin-tazobactam (ZOSYN) 4.5 g in sterile water (preservative free) 20 mL IV push injection  Once     Route: Intravenous  Ordered Dose: 4.5 g     Last MAR action: Given Alexxander Kurt A    01/12/20 1359 01/12/20 1358  ketorolac (TORADOL) injection 15 mg  Once     Route: Intravenous   Ordered Dose: 15 mg     Last MAR action: Given Nattie Lazenby A          Orders Placed During this Encounter     Orders Placed  This Encounter   Procedures    [place] VenoDopp Low Extremity Left    CBC and differential    Basic Metabolic Panel    GFR       Diagnostic Study Results     Labs     Results     Procedure Component Value Units Date/Time    Basic Metabolic Panel [[id number]]  (Abnormal) Collected: 01/12/20 1415    Specimen: Blood Updated: 01/12/20 1443     Glucose 103 mg/dL      BUN 9 mg/dL      Creatinine 0.8 mg/dL      Calcium 9.1 mg/dL      Sodium 621 mEq/L      Potassium 4.6 mEq/L      Chloride 107 mEq/L      CO2 20 mEq/L      Anion Gap 12.0    GFR [[id number]] Collected: 01/12/20 1415     Updated: 01/12/20 1443     EGFR >60.0    CBC and differential [[id number]]  (Abnormal) Collected: 01/12/20 1415    Specimen: Blood Updated: 01/12/20 1421     WBC 7.20 x10 3/uL      Hgb 11.3 g/dL      Hematocrit 32.4 %      Platelets 286 x10 3/uL      RBC 3.20 x10 6/uL      MCV 104.1 fL      MCH 35.3 pg      MCHC 33.9 g/dL      RDW 13 %      MPV 9.6 fL      Neutrophils 65.0 %      Lymphocytes Automated 23.2 %      Monocytes 10.4 %      Eosinophils Automated 0.8 %      Basophils Automated 0.3 %      Immature Granulocytes 0.3 %      Nucleated RBC 0.0 /100 WBC      Neutrophils Absolute 4.68 x10 3/uL      Lymphocytes Absolute Automated 1.67 x10 3/uL      Monocytes Absolute Automated 0.75 x10 3/uL      Eosinophils Absolute Automated 0.06 x10 3/uL      Basophils Absolute Automated 0.02 x10 3/uL      Immature Granulocytes Absolute 0.02 x10 3/uL      Absolute NRBC 0.00 x10 3/uL           Radiologic Studies  Radiology Results (24 Hour)     Procedure Component Value Units Date/Time    [place] VenoDopp Low Extremity Left [[id number]] Collected: 01/12/20 1557    Order Status: Completed Updated: 01/12/20 1600    Narrative:      CLINICAL HISTORY: Swelling..    COMPARISON:  01/09/2020.Marland Kitchen    Evaluation of the left lower extremity was performed  utilizing gray  scale, color and spectral waveforms.    The common femoral vein, femoral vein, popliteal vein, visualized  portions of the profunda vein, great saphenous vein, posterior tibial  and peroneal veins are patent and compressible. There is respiratory  variation throughout.    The contralateral common femoral vein at the great saphenous junction is  patent with flow in the appropriate direction.      Impression:          There is no evidence of deep venous thrombosis within the left lower  extremity in the above mentioned veins. No change compared to the prior  study.    Stephannie Peters, MD  01/12/2020 3:58 PM      .    Clinical Course / MDM       Notes:   Ultrasound was negative for DVT.  Labs are unremarkable except for CO2 of 20.  At 4:45 PM I discussed admission with the patient and she defers.  She received 1 dose of IV Zosyn in the emergency department.  She was given Augmentin and clindamycin to use in the outpatient setting for MSSA and MRSA respectively.  She will make follow-up early outpatient follow-up with her primary care doctor.  Due to return precautions given the patient.  Discussion of abnormal results/incidental findings:       Consults:      Data Review     Nursing records reviewed and agree: Yes    Pulse Oximetry Analysis - Normal  Laboratory results reviewed by EDP: Yes  Radiologic study results reviewed by EDP: Yes    Rendering Provider: Dr Lourdes Sledge    Monitors, EKG     Cardiac Monitor (interpreted by ED physician):      EKG (interpreted by ED physician):       Critical Care     Critical care exclusive of time spent performing procedures.    Total time:          Clinical Impression & Disposition     Clinical Impression:  1. Cellulitis of left lower extremity        Disposition  ED Disposition     ED Disposition Condition Date/Time Comment    Discharge  Fri Jan 12, 2020  4:58 PM Lacie Landry discharge to home/self care.    Condition at disposition: Stable          Prescriptions     New Prescriptions    AMOXICILLIN-CLAVULANATE (AUGMENTIN) 500-125 MG PER TABLET    Take 1 tablet by mouth 3 (three) times daily for 7 days    CLINDAMYCIN (CLEOCIN) 300 MG CAPSULE    Take 1 capsule (300 mg total) by mouth 3 (three) times daily for 7 days              Lucille Passy, MD  01/12/20 1704

## 2020-01-12 NOTE — ED Triage Notes (Signed)
Patient c/o left leg swelling and pain x 1 week. Patient seen in ED Tuesday with negative [place]. Symptoms not improving despite antibiotics started by PCP (Cefdenir) which she started yesterday.

## 2020-01-12 NOTE — Discharge Instructions (Signed)
Cellulitis    You were diagnosed with cellulitis.    This is a bacterial infection of the skin. Symptoms are usually redness, swelling, and warmth in the affected area. Some people get a fever (temperature higher than 100.4F / 38C) with this infection.    Keep the extremity (arm or leg) above your heart level if possible.    Cellulitis is treated with antibiotics. It is also treated by keeping the affected area elevated (up). Sometimes, antibiotics are given intravenously ("IV"). Other infections can be treated with oral (by mouth) medicines.    Redness, swelling, warmth, and fever should start to get better after 2-3 days of treatment. Come back here or go to the nearest Emergency Department or your primary doctor for a re-check as directed.    Return here or go to the nearest Emergency Department in 24 hours. This will be for another examination.    YOU SHOULD SEEK MEDICAL ATTENTION IMMEDIATELY, EITHER HERE OR AT THE NEAREST EMERGENCY DEPARTMENT, IF ANY OF THE FOLLOWING OCCURS:   Redness spreads even with treatment. You can mark the infection area with a pen. This will help watch for improvement or spreading.   Fever (temperature higher than 100.4F / 38C) doesn t go away or gets worse after 2-3 days of antibiotics.   Unusual or increasing pain in the infected area.   Lightheadedness.   Feeling sicker at any time or not getting better as expected.

## 2020-02-07 ENCOUNTER — Other Ambulatory Visit: Payer: Self-pay | Admitting: Otolaryngic Allergy

## 2020-02-21 ENCOUNTER — Encounter (INDEPENDENT_AMBULATORY_CARE_PROVIDER_SITE_OTHER): Payer: Self-pay

## 2020-02-21 NOTE — Progress Notes (Signed)
Appt Date/Time: 02/22/20 @ 11:00 am    59 y.o. female   PCP: Staci Righter, MD  Referring Physician:    New Patient   X Follow-Up Post-op   [place] [facility] Other Imaging  01/12/20 Left veno dopp  7/20/21left leg venous     Chief Complaint/HPI: LLE pain and swelling.  -ED  -Hx DVT  Date of Last Visit: N/A    Allergies:   Allergies   Allergen Reactions    Latex Itching    Azopt [Brinzolamide] Other (See Comments)     Not sure of reaction    Brimonidine Tartrate Other (See Comments)     Eyes get red      Selected Medications:    Coumadin Xarelto Eliquis Pradaxa Lovenox Other thinner:    Plavix Aspirin Brillinta Effient  Pletal Trental Fish Oil   Insulin Metformin Other Diabetes Atorvastatin Rosuvastatin Other Chol:    MHx:  Stroke / TIA / Seizures HTN / HLD  Diabetes   MI / CAD A-fib / Arrhythmia  COPD / Asthma / (other lung)   Kidney Disease Liver Disease DVT / Coagulopathy   Wounds Spine / other MSK  Cancer            Smoker   X        Former Smoker          Never Smoker    Vital Signs this Visit Pulses  HT  BP R BP L Temp   Rad Ulnar Brach Fem Pop DP PT        R          WT  Pulse R Pulse L SpO2       L          Imaging results:        Plan of Care:

## 2020-02-22 ENCOUNTER — Encounter (INDEPENDENT_AMBULATORY_CARE_PROVIDER_SITE_OTHER): Payer: Self-pay | Admitting: Vascular Surgery

## 2020-02-22 ENCOUNTER — Ambulatory Visit (INDEPENDENT_AMBULATORY_CARE_PROVIDER_SITE_OTHER): Payer: HMO | Admitting: Vascular Surgery

## 2020-02-22 VITALS — BP 129/85 | HR 91 | Temp 96.9°F | Ht 63.0 in | Wt 139.4 lb

## 2020-02-22 DIAGNOSIS — I871 Compression of vein: Secondary | ICD-10-CM

## 2020-02-22 DIAGNOSIS — I872 Venous insufficiency (chronic) (peripheral): Secondary | ICD-10-CM

## 2020-02-22 DIAGNOSIS — I87009 Postthrombotic syndrome without complications of unspecified extremity: Secondary | ICD-10-CM

## 2020-02-22 NOTE — Progress Notes (Signed)
OFFICE VISIT  [facility]    Patient Name: [name], [name]  Vascular Surgeon:  De Blanch, M.D.  Chief Complaint: New Visit (LLE pain and swelling.)      History of Present Illness:   We had the pleasure of seeing [name] in the office today.  The patient is a 59 y.o. female who presents for new patient evaluation regarding left lower extremity swelling and pain.  Patient has a history of cellulitis treated with oral cefdinir.  Duplex ultrasound was negative for DVT.  She continued to have lower extremity swelling and was seen in the ED on 07/23 and given 1 odse of IV Zosyn and discharged on oral Augmentin and clinidamycin.    Prior history involves DVT approximately 8 years ago involving the left lower extremity.  Pt has a known hypercoagulable state and is on Xarelto.  Since the initial DVT 8 years ago, she has had on and off swelling of the left leg however not as bad as the current swelling.  She has purchased compression stockings however what ever has not worn them yet.  Patient reports stinging pains in the left leg.  She often has early fatigue in that leg as well.    Past Medical History:     Past Medical History:   Diagnosis Date    Chronic ulcerative colitis     nio issues for long time    Claustrophobia     Clotting disorder     DVT LLE 2012    Deep venous thrombosis of calf 2012    LLL/on xeralto//    GERD (gastroesophageal reflux disease)     Glaucoma NEC     bilat    Hypertensive disorder     controlled well with medication    Ovarian cyst, right     Plantar fasciitis     Seasonal allergies      Past Surgical History:     Past Surgical History:   Procedure Laterality Date    BUNIONECTOMY      left foot    CATARACT EXTRACTION      EYE SURGERY Bilateral 2013    laser surgery for glaucoma, bilateral trabeculectomy    GLAUCOMA SURGERY      HYSTERECTOMY      removed both ovaries and tubes    revision of hysterectomyl suture line      REVISION, BLEB, SCLERAL PATCH GRAFT  Left 05/25/2019    Procedure: REVISION, BLEB;  Surgeon: Lamount Cranker, MD;  Location: [facility];  Service: Ophthalmology;  Laterality: Left;    TONSILLECTOMY      TRABECULECTOMY Left 03/22/2014    Procedure: TRABECULECTOMY;  Surgeon: Pecola Leisure, MD;  Location: [facility];  Service: Ophthalmology;  Laterality: Left;  TRABECULECTOMY REVISION, LEFT EYE  Q1=N  ANES=MAC  EQUIP=MITOMYCIN C, 0.4mg /cc, DISPENSE 5cc (ORDER TO FOLLOW)  MD REQ 30 MIN  ALLERGIES=COSOPT, AZOPT, ALPHAGAN  **Need RESIGHT SET from SPD    ureterectomy left       Family History:     Family History   Problem Relation Age of Onset    Hypertension Father     Vision loss Father     Vision loss Brother     Hypertension Paternal Aunt     Vision loss Paternal Uncle      Social History:     Social History     Tobacco Use    Smoking status: Former Smoker     Quit  date: 12/21/2010     Years since quitting: 9.1    Smokeless tobacco: Never Used   Vaping Use    Vaping Use: Never used   Substance Use Topics    Alcohol use: Yes     Alcohol/week: 1.0 standard drinks     Types: 1 Cans of beer per week     Comment: 1/week    Drug use: No     Allergies:     Allergies   Allergen Reactions    Latex Itching    Azopt [Brinzolamide] Other (See Comments)     Not sure of reaction    Brimonidine Tartrate Other (See Comments)     Eyes get red     Medications:     Current Outpatient Medications:     acetaminophen (TYLENOL) 500 MG tablet, Take 1,000 mg by mouth daily as needed  , Disp: , Rfl:     desloratadine (CLARINEX) 5 MG tablet, Take 5 mg by mouth daily., Disp: , Rfl:     dexlansoprazole (Dexilant) 60 MG capsule, Take 60 mg by mouth daily, Disp: , Rfl:     hydrocortisone 2.5 % cream, APPLY TO AFFECTED AREA TWICE DAILY, Disp: , Rfl:     metoprolol (LOPRESSOR) 100 MG tablet, Take 100 mg by mouth daily., Disp: , Rfl:     rivaroxaban (Xarelto) 10 MG Tab, Take 20 mg by mouth daily  , Disp: , Rfl:     UNABLE TO FIND, Timolol eyedrops  Prednisone eyedrops, Disp: , Rfl:   Review of Systems:   Constitutional: Negative for fevers and chills  Skin: No rash or lesions  Respiratory: Negative for cough, wheezing, or hemoptysis  Cardiovascular: Negative for chest pain and dyspnea  Gastrointestinal: Negative for abdominal pain, nausea, vomiting and diarrhea  Musculoskeletal:  No arthritic symptoms    All other systems were reviewed and are negative except what is stated in the HPI    Physical Exam:     Vitals:    02/22/20 1117 02/22/20 1118   BP: 119/81 129/85   BP Site: Right arm Left arm   Patient Position: Sitting Sitting   Cuff Size: Medium Medium   Pulse: 80 91   Temp: (!) 96.9 F (36.1 C)    TempSrc: Temporal    SpO2: 98%    Weight: 63.2 kg (139 lb 6.4 oz)    Height: 1.6 m (5\' 3" )      Body mass index is 24.69 kg/m.    General:  Patient appears their stated age, well-nourished.  Alert and in no apparent distress.  Lungs: Respiratory effort unlabored, chest expansion symmetric.  Cardiac: RRR, no carotid bruits, no JVD.  Abd: Soft, nondistended, nontender.  No guarding or rebound, No mid line pulsatile mass   Extremities: Full ROM, symmetric, warm  Veins: Varicose veins No, hyperpigmentation No, lipo-dermatosclerosis No    Skin: Swelling of LLE into foot  Neuro: Good insight and judgment, oriented to person, place, and time, gross motor and sensory intact       Pulse exam:   Radial Femoral Popliteal DP PT   Right Palpable Palpable Not assessed Palpable Not assessed   Left Palpable Palpable Not assessed Palpable Not assessed     Labs:   No new labs     Rads:   LLE venous duplex performed at [facility] on 01/12/2020:  IMPRESSION: There is no evidence of deep venous thrombosis within the left lower extremity in the above mentioned veins. No change compared to the prior  study.    LLE venous duplex performed at [facility] on 01/09/2020:  IMPRESSION:  No deep venous thrombus in the  left  lower extremity.    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 Venous insufficiency of both lower extremities    2. Postphlebitic syndrome    3. Iliac vein stenosis, left      Ms. Merkley is a 59 year old female who presents to the office for discussion of left lower extremity swelling in the setting of history of left lower extremity DVT.  Recent imaging demonstrated no recurrent DVT.  We discussed that there is potentially a significant opponent of post phlebitic syndrome which is best treated with compression stockings.  I would also like to obtain imaging of her ilio caval veins as well as a repeat ultrasound of the left lower extremity deep veins and superficial system to rule out reflux.  When she completes these ultrasounds, she will follow up in the office for further discussion.  In the meantime, she was given instructions on use for compression stockings and provided a prescription for compression grade stockings.  Patient will call the office should she have further questions or concerns.    Return for abdominal and bilateral lower extremity venous duplexes.    De Blanch, M.D.    Warrick Parisian, MSN, [facility]  Vascular Nurse Practitioner  [facility]  [address]  [place] [address]  T [phone]  F [phone]    Orders Placed This Encounter   Procedures    Compression stockings     20-30 mmHg compression stocking  May be open or closed toe per pt preference.  May be knee high, thigh high, or pantyhose length per pt preference.  Take off at night and rest with legs elevated  Please measure and help fit    US Aorta Ivc Iliac Art/Grft Duplex Dopp Comp     Standing Status:   Future     Standing Expiration Date:   08/21/2021     Order Specific Question:   Clinical info for radiologist     Answer:   Viceral venous/IVC iliac veins, eval for May Thurner     Order Specific Question:   Release to patient      Answer:   Immediate    US Arterial/ Graft Duplex Dopp Low Extrem Bil Comp     Standing Status:   Future     Standing Expiration Date:   08/21/2021     Order Specific Question:   Clinical info for radiologist     Answer:   ?post phlebotic syndrome?  History of DVT     Order Specific Question:   Release to patient     Answer:   Immediate

## 2020-03-21 ENCOUNTER — Other Ambulatory Visit (INDEPENDENT_AMBULATORY_CARE_PROVIDER_SITE_OTHER): Payer: HMO

## 2020-03-21 ENCOUNTER — Ambulatory Visit (INDEPENDENT_AMBULATORY_CARE_PROVIDER_SITE_OTHER): Payer: HMO | Admitting: Vascular Surgery

## 2020-04-04 ENCOUNTER — Encounter (INDEPENDENT_AMBULATORY_CARE_PROVIDER_SITE_OTHER): Payer: Self-pay

## 2020-04-04 ENCOUNTER — Ambulatory Visit (INDEPENDENT_AMBULATORY_CARE_PROVIDER_SITE_OTHER): Payer: HMO | Admitting: Vascular Surgery

## 2020-04-04 ENCOUNTER — Other Ambulatory Visit (INDEPENDENT_AMBULATORY_CARE_PROVIDER_SITE_OTHER): Payer: HMO

## 2020-04-04 NOTE — Progress Notes (Signed)
Room:         Appt Date/Time:10/21 1215pm    59 y.o. female   PCP: Staci Righter, MD  Referring Physician:    New Patient Follow-Up Post-op   [place] Today  -arterial & aorta/iliac  [place] [facility] Other Imaging     Chief Complaint/HPI: f/u- left lower extremity swelling and pain.   Date of Last Visit: 02/22/20    Allergies:   Allergies   Allergen Reactions    Latex Itching    Azopt [Brinzolamide] Other (See Comments)     Not sure of reaction    Brimonidine Tartrate Other (See Comments)     Eyes get red        Selected Medications:    Coumadin Xarelto Eliquis Pradaxa Lovenox Other thinner:    Plavix Aspirin Brillinta Effient  Pletal Trental Fish Oil   Insulin Metformin Other Diabetes Atorvastatin Rosuvastatin Other Chol:    MHx:  Stroke / TIA / Seizures HTN / HLD  Diabetes   MI / CAD A-fib / Arrhythmia  COPD / Asthma / (other lung)   Kidney Disease Liver Disease DVT / Coagulopathy   Wounds Spine / other MSK  Cancer            Smoker        x   Former Smoker          Never Smoker    Vital Signs this Visit Pulses  HT  BP R BP L Temp   Rad Ulnar Brach Fem Pop DP PT        R          WT  Pulse R Pulse L SpO2       L          Imaging results:        Plan of Care:

## 2020-04-09 NOTE — Addendum Note (Signed)
Addended by: Cherre Robins on: 04/09/2020 01:52 PM     Modules accepted: Orders

## 2020-04-09 NOTE — Progress Notes (Signed)
OFFICE VISIT  [facility]    Patient Name: [name], [name]  Vascular Surgeon:  De Blanch, M.D.  Chief Complaint: Swelling In Extremities (F/U LLE swelling/pain )      History of Present Illness:   We had the pleasure of seeing [name] in the office today.  The patient is a 59 y.o. female who presents for follow up regarding left lower extremity swelling and pain.  Patient has a history of cellulitis treated with oral cefdinir.  Duplex ultrasound was negative for DVT.  She continued to have lower extremity swelling and was seen in the ED on 07/23 and given 1 dose of IV Zosyn and discharged on oral Augmentin and clinidamycin.    Prior history involves DVT approximately 8 years ago involving the left lower extremity.  Pt has a known hypercoagulable state and is on Xarelto.  Since the initial DVT 8 years ago, she has had on and off swelling of the left leg however not as bad as the current swelling.  She has purchased compression stockings however what ever has not worn them yet.  Patient reports stinging pains in the left leg.  She often has early fatigue in that leg as well.    Patient reports symptoms are essentially unchanged since her last visit.  She has started using compression stockings and has noted some improvement in her symptoms with their use.  She continues to deny lower extremity nonhealing wounds.  She does note some skin changes involving the left lower extremity over the past several months.  She would like to see a hematologist again as it has been sometime since she was evaluated.  She cannot recall if she had genetic testing performed at the time of her initial evaluation.    Past Medical History:     Past Medical History:   Diagnosis Date    Chronic ulcerative colitis     nio issues for long time    Claustrophobia     Clotting disorder     DVT LLE 2012    Deep venous thrombosis of calf 2012    LLL/on xeralto//    GERD (gastroesophageal reflux disease)     Glaucoma NEC      bilat    Hypertensive disorder     controlled well with medication    Ovarian cyst, right     Plantar fasciitis     Seasonal allergies      Past Surgical History:     Past Surgical History:   Procedure Laterality Date    BUNIONECTOMY      left foot    CATARACT EXTRACTION      EYE SURGERY Bilateral 2013    laser surgery for glaucoma, bilateral trabeculectomy    GLAUCOMA SURGERY      HYSTERECTOMY      removed both ovaries and tubes    revision of hysterectomyl suture line      REVISION, BLEB, SCLERAL PATCH GRAFT Left 05/25/2019    Procedure: REVISION, BLEB;  Surgeon: Lamount Cranker, MD;  Location: [facility];  Service: Ophthalmology;  Laterality: Left;    TONSILLECTOMY      TRABECULECTOMY Left 03/22/2014    Procedure: TRABECULECTOMY;  Surgeon: Pecola Leisure, MD;  Location: [facility];  Service: Ophthalmology;  Laterality: Left;  TRABECULECTOMY REVISION, LEFT EYE  Q1=N  ANES=MAC  EQUIP=MITOMYCIN C, 0.4mg /cc, DISPENSE 5cc (ORDER TO FOLLOW)  MD REQ 30 MIN  ALLERGIES=COSOPT, AZOPT, ALPHAGAN  **Need RESIGHT SET from  SPD    ureterectomy left       Family History:     Family History   Problem Relation Age of Onset    Hypertension Father     Vision loss Father     Vision loss Brother     Hypertension Paternal Aunt     Vision loss Paternal Uncle      Social History:     Social History     Tobacco Use    Smoking status: Former Smoker     Quit date: 12/21/2010     Years since quitting: 9.3    Smokeless tobacco: Never Used   Vaping Use    Vaping Use: Never used   Substance Use Topics    Alcohol use: Yes     Alcohol/week: 1.0 standard drink     Types: 1 Cans of beer per week     Comment: 1/week    Drug use: No     Allergies:     Allergies   Allergen Reactions    Latex Itching    Azopt [Brinzolamide] Other (See Comments)     Not sure of reaction    Brimonidine Tartrate Other (See Comments)     Eyes get red     Medications:     Current Outpatient Medications:     acetaminophen (TYLENOL)  500 MG tablet, Take 1,000 mg by mouth daily as needed  , Disp: , Rfl:     desloratadine (CLARINEX) 5 MG tablet, Take 5 mg by mouth daily., Disp: , Rfl:     dexlansoprazole (Dexilant) 60 MG capsule, Take 60 mg by mouth daily, Disp: , Rfl:     hydrocortisone 2.5 % cream, APPLY TO AFFECTED AREA TWICE DAILY, Disp: , Rfl:     metoprolol (LOPRESSOR) 100 MG tablet, Take 100 mg by mouth daily., Disp: , Rfl:     rivaroxaban (Xarelto) 10 MG Tab, Take 10 mg by mouth daily  , Disp: , Rfl:     timolol (TIMOPTIC) 0.5 % ophthalmic solution, Place 1 drop into the left eye daily  , Disp: , Rfl:   Review of Systems:   Constitutional: Negative for fevers and chills  Skin: No rash or lesions  Respiratory: Negative for cough, wheezing, or hemoptysis  Cardiovascular: Negative for chest pain and dyspnea  Gastrointestinal: Negative for abdominal pain, nausea, vomiting and diarrhea  Musculoskeletal:  No arthritic symptoms    All other systems were reviewed and are negative except what is stated in the HPI    Physical Exam:     Vitals:    04/11/20 1138 04/11/20 1141   BP: (!) 142/91 137/90   BP Site: Left arm Right arm   Patient Position: Sitting Sitting   Cuff Size: Medium Medium   Pulse: 84 79   Temp: 97.3 F (36.3 C)    TempSrc: Temporal    SpO2: 99% 99%   Weight: 61.2 kg (135 lb)    Height: 1.6 m (5\' 3" )      Body mass index is 23.91 kg/m.    General:  Patient appears their stated age, well-nourished.  Alert and in no apparent distress.  Lungs: Respiratory effort unlabored, chest expansion symmetric.  Extremities: Full ROM, symmetric, warm  Veins: Varicose veins Yes, hyperpigmentation Yes, lipo-dermatosclerosis Yes  Skin: Small varicose veins left medial calf.  Spider and reticular veins in both lower extremities.  Thickening of the skin in the left lower leg  Neuro: Good insight and judgment, oriented to person, place,  and time, gross motor and sensory intact  Calf circumference L  35.5 cm R 34.5cm  Ankle circumference L 25.7 cm  R 23.0 cm    Labs:     CBC: No results found for requested labs within last 720 hours.    CMP: No results found for requested labs within last 720 hours.     Lipid Panel No results found for requested labs within last 720 hours.    Coags: No results found for requested labs within last 720 hours.    Labs reviewed  Rads:   Bilateral lower extremity venous insufficiency duplex performed in our vascular lab 04/11/2020:    1. No evidence of acute deep vein thrombosis seen bilaterally. Chronic scar tissue was seen in the left popliteal vein from previous DVT.   2. > 1 second reflux seen in the left FV, popliteal vein and PTV.   3. No evidence of reflux seen in the right great saphenous vein and bilateral small saphenous veins.   4. Focal > 0.5 second reflux seen in the left mid thigh GSV.   5. Right Baker's cyst measuring 1.4x1.8 cm in transverse.   6. Left Baker's cyst measuring 1.1x1.3 cm in transverse.     Visceral venous duplex performed in our vascular lab 04/11/2020:  Patent IVC and bilateral iliac veins. No evidence of thromboses or stenosis or compression was seen in the above mentioned veins.     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 Post-thrombotic syndrome of left lower extremity    2. History of DVT (deep vein thrombosis)        -The patient has deep and superficial venous reflux involving the left lower extremity.  She does not have any identifiable ileal caval compression on ultrasound.  Her superficial reflux in the left leg is confined to the mid thigh portion of the GSV and is focal in nature.  She has significant deep venous reflux involving the femoral popliteal and posterior tibial veins most likely secondary to her prior history of venous thromboembolism.  I think most likely her symptoms are post thrombotic in etiology.  She  has had some improvement with the use of compression stockings.  We discussed the importance of conservative management as there is no surgical intervention to cure this.  At this point the patient would like to continue with the use of compression stockings.  She will follow-up in 3 months to determine if her symptoms are being adequately controlled.  At that point if compression stockings are not adequate we may consider a pump.  She will call us in the interim with any questions or concerns.  -The patient would like to see a hematologist as it has been many years since she has been evaluated to determine if she needs to continue her oral anticoagulation.  She was given the name of local hematologist for evaluation.  Is unclear whether she had any prior genetic testing and she unfortunately does not have the results to bring with her to the future visit.    Return in about 3 months (around 07/12/2020) for symptom recheck, no imaging needed.      Warrick Parisian, FNP  [facility]  [address]  [place], [place] [address]  Karie Schwalbe  [phone]    F  [phone]      Orders Placed This Encounter   Procedures    Hematology Oncology Referral: Hennie Duos, MD [facility])     Referral Priority:   Routine     Referral Type:   Consultation     Referral Reason:   Specialty Services Required     Referred to Provider:   Tivis Ringer, MD     Requested Specialty:   Medical Oncology     Number of Visits Requested:   1

## 2020-04-11 ENCOUNTER — Ambulatory Visit (INDEPENDENT_AMBULATORY_CARE_PROVIDER_SITE_OTHER): Payer: HMO

## 2020-04-11 ENCOUNTER — Ambulatory Visit (INDEPENDENT_AMBULATORY_CARE_PROVIDER_SITE_OTHER): Payer: HMO | Admitting: Family

## 2020-04-11 ENCOUNTER — Encounter (INDEPENDENT_AMBULATORY_CARE_PROVIDER_SITE_OTHER): Payer: Self-pay | Admitting: Family

## 2020-04-11 VITALS — BP 137/90 | HR 79 | Temp 97.3°F | Ht 63.0 in | Wt 135.0 lb

## 2020-04-11 DIAGNOSIS — I871 Compression of vein: Secondary | ICD-10-CM

## 2020-04-11 DIAGNOSIS — I87002 Postthrombotic syndrome without complications of left lower extremity: Secondary | ICD-10-CM

## 2020-04-11 DIAGNOSIS — Z7901 Long term (current) use of anticoagulants: Secondary | ICD-10-CM

## 2020-04-11 DIAGNOSIS — D6859 Other primary thrombophilia: Secondary | ICD-10-CM

## 2020-04-11 DIAGNOSIS — I87009 Postthrombotic syndrome without complications of unspecified extremity: Secondary | ICD-10-CM

## 2020-04-11 DIAGNOSIS — I872 Venous insufficiency (chronic) (peripheral): Secondary | ICD-10-CM

## 2020-04-11 DIAGNOSIS — Z86718 Personal history of other venous thrombosis and embolism: Secondary | ICD-10-CM

## 2020-07-04 ENCOUNTER — Ambulatory Visit (INDEPENDENT_AMBULATORY_CARE_PROVIDER_SITE_OTHER): Payer: HMO | Admitting: Vascular Surgery

## 2020-09-19 ENCOUNTER — Ambulatory Visit (HOSPITAL_BASED_OUTPATIENT_CLINIC_OR_DEPARTMENT_OTHER): Payer: HMO | Admitting: Physician Assistant

## 2020-09-19 NOTE — Progress Notes (Addendum)
Patient comes in today complaining of thoracic pain.    Physician Referral: None    History of present illness:  60 y/o female, is a new patient and comes into office today for evaluation of T11 compression after a fall on 08/05/2020.     She states she tripped in her office at home and landed on her back. She has not been braced.     Her back pain is more left sided lower thoracic region.    She is currently seeing Dr. Karmen Bongo and had a cortisone injection on 09/12/2020 with some relief.  She states that her husband sees Dr. Chandra Batch for pain management which is why she went to see him.     For the pain, she is taking Tylenol with some relief. Does not take this every day.    She brings x-rays and an MRI of thoracic spine.     Denies any numbness, tingling sensation or weakness in the B/L UE or LE. Denies bowel/bladder dysfunction or saddle anesthesias.         Inciting event: Fall DOI: 08/05/2020.    Pain level rating: 7/10    Previous surgery: None    Associated pain: None    Other neurological symptoms: None      Associated problems: None, specifically no valsalva exacerbation pain or bowel/bladder dysfunction    Positions that worsen the symptoms: Bending and Going up/downstairs     Positions that improve the symptoms: None    Current treatment: Epidural injections Dr. Tammy Sours Fischer and Tylenol      Current Outpatient Medications:   .  acetaminophen (TYLENOL) 500 MG tablet, Take 1,000 mg by mouth daily as needed  , Disp: , Rfl:   .  desloratadine (CLARINEX) 5 MG tablet, Take 5 mg by mouth daily., Disp: , Rfl:   .  dexlansoprazole (Dexilant) 60 MG capsule, Take 60 mg by mouth daily, Disp: , Rfl:   .  hydrocortisone 2.5 % cream, APPLY TO AFFECTED AREA TWICE DAILY, Disp: , Rfl:   .  metoprolol (LOPRESSOR) 100 MG tablet, Take 100 mg by mouth daily., Disp: , Rfl:   .  rivaroxaban (Xarelto) 10 MG Tab, Take 10 mg by mouth daily  , Disp: , Rfl:   .  timolol (TIMOPTIC) 0.5 % ophthalmic solution, Place 1 drop into the  left eye daily  , Disp: , Rfl:      Allergies   Allergen Reactions   . Latex Itching   . Azopt [Brinzolamide] Other (See Comments)     Not sure of reaction   . Brimonidine Tartrate Other (See Comments)     Eyes get red        Past Medical History:   Diagnosis Date   . Chronic ulcerative colitis     nio issues for long time   . Claustrophobia    . Clotting disorder     DVT LLE 2012   . Deep venous thrombosis of calf 2012    LLL/on xeralto//   . GERD (gastroesophageal reflux disease)    . Glaucoma NEC     bilat   . Hypertensive disorder     controlled well with medication   . Ovarian cyst, right    . Plantar fasciitis    . Seasonal allergies         Past Surgical History:   Procedure Laterality Date   . BUNIONECTOMY      left foot   . CATARACT EXTRACTION     .  EYE SURGERY Bilateral 2013    laser surgery for glaucoma, bilateral trabeculectomy   . GLAUCOMA SURGERY     . HYSTERECTOMY      removed both ovaries and tubes   . revision of hysterectomyl suture line     . REVISION, BLEB, SCLERAL PATCH GRAFT Left 05/25/2019    Procedure: REVISION, BLEB;  Surgeon: Lamount Cranker, MD;  Location: [facility];  Service: Ophthalmology;  Laterality: Left;   . TONSILLECTOMY     . TRABECULECTOMY Left 03/22/2014    Procedure: TRABECULECTOMY;  Surgeon: Pecola Leisure, MD;  Location: [facility];  Service: Ophthalmology;  Laterality: Left;  TRABECULECTOMY REVISION, LEFT EYE  Q1=N  ANES=MAC  EQUIP=MITOMYCIN C, 0.4mg /cc, DISPENSE 5cc (ORDER TO FOLLOW)  MD REQ 30 MIN  ALLERGIES=COSOPT, AZOPT, ALPHAGAN  **Need RESIGHT SET from SPD   . ureterectomy left          12 Point Review of Systems:  noncontributory, except for what's listed in history.  Review of Systems   Constitutional: Negative.  Negative for chills and fever.   HENT: Negative.    Eyes: Negative.    Respiratory: Negative.    Cardiovascular: Negative.    Gastrointestinal: Negative.    Genitourinary: Negative.    Musculoskeletal: Positive for back pain.   Skin: Negative.     Neurological: Negative.  Negative for tingling and weakness.   Endo/Heme/Allergies: Negative.    Psychiatric/Behavioral: Negative.       Social History : No changes from listed in chart.             PHYSICAL EXAM:  Constitutional: Pt Looks comfortable, breathing well, in no distress.    Weight Status - Normal for age    Gait: Assistive devices - None    Psychiatric:  PT appears to be alert and orientated with stable psychiatric evaluation.  Waddell Signs:  Negative    Musculoskeletal:      Lumbar:   Range of Motion: not tested today  Flexion not tested today inches above floor  Extension not tested today degrees    Tenderness:   Midline Thoracic Spine: TTP lower thoracic region most significant T11, T12  Midline Lumbar Spine: None  Iliolumbar Region: None  SI joint Region: None  Greater Trochanteric Region: None    Straight Leg Raising: Negative  Crossed SLR:  Negative  Femoral Nerve Stretch Test:  Negative  FABER test: Negative bilaterally  Hip range of motion: Without pain, full bilaterally    Neurologic:    Motor function lower extremities:    Right:  Iliopsoas 5/5   Quadriceps 5/5  Anterior tibialis 5/5   EHL 4/5  Gastroc 5/5     Left:  Iliopsoas 5/5   Quadriceps 5/5  Anterior tibialis 5/5   EHL 5/5  Gastroc 5/5       Deep Tendon Reflexes:  2/2 Left knee, 2/2 Right Knee, 2/2 Left ankle,  2/2 Right ankle      Sensory Examination: Normal to light touch along bilateral lower extremities    Babinski:  Negative  Clonus: Negative    Skin:  Without rashes/ lesions    Vasculature:  Capillary refill intact UE's, LE's  Radial pulses intact.    Hematologic/Lymph:  No evidence of Pitting Edema in LE's, No UE lymphedema, no signs of DVT or Homans sign, no calf pain or swelling            Imaging Studies:  XR thoracolumbar spine from today 09/27/20 shows  T11 vertebra plana. Thoracic levocurvature. Spondylolisthesis L3-4. Severe disc space collapse at L4-5. Diffuse degenerative changes throughout thoracic and lumbar  spine.    MRI thoracic spine showing T11 compression fracture resulting in mild central stenosis. Chronic T6 compression deformity.             Assessment/Plan:    T11 compression fracture severe      Fall on 08/07/2020. Back pain following this. We discussed her imaging results as well as further treatment plan.    Dr. Tammy Sours Fischer had provided her with IR consult information to consider kyphoplasty. She has pretty severe collapse at T11 and is at this point already 6 weeks from injury. She is welcome to an opinion from IR on kyphoplasty but will likely benefit more from bracing treatment over the next 6 weeks, as there are risks associated with surgery general anesthesia with the kyphoplasty.    Again she is welcome to consider kyphoplasty and consultation with IR at her convenience.    Today we discussed bracing with TLSO for the next 6 weeks. She would like to pursue this. Pt was fit with TLSO. Wear at all times except she can remove when laying down to sleep and for bathing purposes.    Tylenol #3 sent to pharmacy to help with fracture related pain. [place] PMP checked today.    Follow up in 5 weeks for recheck. We will plan to repeat xray then and hopefully she can start to wean out of the brace shortly after this.    Of note, she does have right EHL weakness on exam. Unknown length of time. We may consider MRI lumbar spine once healed from the fracture to eval for reasoning.    F/U earlier with any questions/concerns.        Also discussed pt's dexa scan results of low back and left hip today at her request. Left hip osteopenia.

## 2020-09-27 ENCOUNTER — Ambulatory Visit (HOSPITAL_BASED_OUTPATIENT_CLINIC_OR_DEPARTMENT_OTHER): Payer: HMO | Admitting: Physician Assistant

## 2020-09-27 ENCOUNTER — Ambulatory Visit (HOSPITAL_BASED_OUTPATIENT_CLINIC_OR_DEPARTMENT_OTHER): Payer: HMO

## 2020-09-27 ENCOUNTER — Encounter (HOSPITAL_BASED_OUTPATIENT_CLINIC_OR_DEPARTMENT_OTHER): Payer: Self-pay | Admitting: Physician Assistant

## 2020-09-27 VITALS — BP 138/93 | HR 82 | Ht 61.42 in | Wt 137.0 lb

## 2020-09-27 DIAGNOSIS — M546 Pain in thoracic spine: Secondary | ICD-10-CM

## 2020-09-27 DIAGNOSIS — S22080D Wedge compression fracture of T11-T12 vertebra, subsequent encounter for fracture with routine healing: Secondary | ICD-10-CM

## 2020-09-27 DIAGNOSIS — M5134 Other intervertebral disc degeneration, thoracic region: Secondary | ICD-10-CM

## 2020-09-27 DIAGNOSIS — M5136 Other intervertebral disc degeneration, lumbar region: Secondary | ICD-10-CM

## 2020-09-27 DIAGNOSIS — M4316 Spondylolisthesis, lumbar region: Secondary | ICD-10-CM

## 2020-09-27 MED ORDER — ACETAMINOPHEN-CODEINE #3 300-30 MG PO TABS
1.00 | ORAL_TABLET | Freq: Four times a day (QID) | ORAL | 0 refills | Status: AC | PRN
Start: 2020-09-27 — End: 2020-10-09

## 2020-10-02 ENCOUNTER — Ambulatory Visit
Admission: RE | Admit: 2020-10-02 | Discharge: 2020-10-02 | Disposition: A | Discharge status: Home / Self Care | Payer: Self-pay | Source: Ambulatory Visit

## 2020-10-02 ENCOUNTER — Other Ambulatory Visit: Payer: Self-pay

## 2020-10-02 ENCOUNTER — Ambulatory Visit: Admission: RE | Admit: 2020-10-02 | Payer: Self-pay | Source: Ambulatory Visit

## 2020-10-24 NOTE — Progress Notes (Unsigned)
60 y/o female, comes into the office today for follow-up on T11 compression after a fall on 08/05/2020.     She was last seen 09/27/2020.                 60 y/o female, is a new patient and comes into office today for evaluation of T11 compression after a fall on 08/05/2020.     She states she tripped in her office at home and landed on her back. She has not been braced.     Her back pain is more left sided lower thoracic region.    She is currently seeing Dr. Karmen Bongo and had a cortisone injection on 09/12/2020 with some relief.  She states that her husband sees Dr. Chandra Batch for pain management which is why she went to see him.     For the pain, she is taking Tylenol with some relief. Does not take this every day.    She brings x-rays and an MRI of thoracic spine.     Denies any numbness, tingling sensation or weakness in the B/L UE or LE. Denies bowel/bladder dysfunction or saddle anesthesias.       Current Outpatient Medications:   .  acetaminophen (TYLENOL) 500 MG tablet, Take 1,000 mg by mouth daily as needed  , Disp: , Rfl:   .  desloratadine (CLARINEX) 5 MG tablet, Take 5 mg by mouth daily., Disp: , Rfl:   .  dexlansoprazole (Dexilant) 60 MG capsule, Take 60 mg by mouth daily, Disp: , Rfl:   .  hydrocortisone 2.5 % cream, APPLY TO AFFECTED AREA TWICE DAILY, Disp: , Rfl:   .  metoprolol (LOPRESSOR) 100 MG tablet, Take 100 mg by mouth daily., Disp: , Rfl:   .  rivaroxaban (XARELTO) 10 MG Tab, Take 10 mg by mouth daily  , Disp: , Rfl:   .  timolol (TIMOPTIC) 0.5 % ophthalmic solution, Place 1 drop into the left eye daily  , Disp: , Rfl:      Allergies   Allergen Reactions   . Latex Itching   . Azopt [Brinzolamide] Other (See Comments)     Not sure of reaction   . Brimonidine Tartrate Other (See Comments)     Eyes get red        Past Medical History:   Diagnosis Date   . Chronic ulcerative colitis     nio issues for long time   . Claustrophobia    . Clotting disorder     DVT LLE 2012   . Deep venous thrombosis  of calf 2012    LLL/on xeralto//   . GERD (gastroesophageal reflux disease)    . Glaucoma NEC     bilat   . Hypertensive disorder     controlled well with medication   . Ovarian cyst, right    . Plantar fasciitis    . Seasonal allergies         Past Surgical History:   Procedure Laterality Date   . BUNIONECTOMY      left foot   . CATARACT EXTRACTION     . EYE SURGERY Bilateral 2013    laser surgery for glaucoma, bilateral trabeculectomy   . GLAUCOMA SURGERY     . HYSTERECTOMY      removed both ovaries and tubes   . revision of hysterectomyl suture line     . REVISION, BLEB, SCLERAL PATCH GRAFT Left 05/25/2019    Procedure: REVISION, BLEB;  Surgeon: Shela Leff I,  MD;  Location: [facility];  Service: Ophthalmology;  Laterality: Left;   . TONSILLECTOMY     . TRABECULECTOMY Left 03/22/2014    Procedure: TRABECULECTOMY;  Surgeon: Pecola Leisure, MD;  Location: [facility];  Service: Ophthalmology;  Laterality: Left;  TRABECULECTOMY REVISION, LEFT EYE  Q1=N  ANES=MAC  EQUIP=MITOMYCIN C, 0.4mg /cc, DISPENSE 5cc (ORDER TO FOLLOW)  MD REQ 30 MIN  ALLERGIES=COSOPT, AZOPT, ALPHAGAN  **Need RESIGHT SET from SPD   . ureterectomy left          12 Point Review of Systems:  noncontributory, except for what's listed in history.  Review of Systems   Constitutional: Negative.  Negative for chills and fever.   HENT: Negative.    Eyes: Negative.    Respiratory: Negative.    Cardiovascular: Negative.    Gastrointestinal: Negative.    Genitourinary: Negative.    Musculoskeletal: Positive for back pain.   Skin: Negative.    Neurological: Negative.  Negative for tingling and weakness.   Endo/Heme/Allergies: Negative.    Psychiatric/Behavioral: Negative.       Social History : No changes from listed in chart.             PHYSICAL EXAM:  Constitutional: Pt Looks comfortable, breathing well, in no distress.    Weight Status - Normal for age    Gait: Assistive devices - None    Psychiatric:  PT appears to be alert and orientated  with stable psychiatric evaluation.  Waddell Signs:  Negative    Musculoskeletal:      Lumbar:   Range of Motion: not tested today  Flexion not tested today inches above floor  Extension not tested today degrees    Tenderness:   Midline Thoracic Spine: TTP lower thoracic region most significant T11, T12  Midline Lumbar Spine: None  Iliolumbar Region: None  SI joint Region: None  Greater Trochanteric Region: None    Straight Leg Raising: Negative  Crossed SLR:  Negative  Femoral Nerve Stretch Test:  Negative  FABER test: Negative bilaterally  Hip range of motion: Without pain, full bilaterally    Neurologic:    Motor function lower extremities:    Right:  Iliopsoas 5/5   Quadriceps 5/5  Anterior tibialis 5/5   EHL 4/5  Gastroc 5/5     Left:  Iliopsoas 5/5   Quadriceps 5/5  Anterior tibialis 5/5   EHL 5/5  Gastroc 5/5       Deep Tendon Reflexes:  2/2 Left knee, 2/2 Right Knee, 2/2 Left ankle,  2/2 Right ankle      Sensory Examination: Normal to light touch along bilateral lower extremities    Babinski:  Negative  Clonus: Negative    Skin:  Without rashes/ lesions    Vasculature:  Capillary refill intact UE's, LE's  Radial pulses intact.    Hematologic/Lymph:  No evidence of Pitting Edema in LE's, No UE lymphedema, no signs of DVT or Homans sign, no calf pain or swelling            Imaging Studies:  XR thoracolumbar spine from today 09/27/20 shows T11 vertebra plana. Thoracic levocurvature. Spondylolisthesis L3-4. Severe disc space collapse at L4-5. Diffuse degenerative changes throughout thoracic and lumbar spine.    MRI thoracic spine showing T11 compression fracture resulting in mild central stenosis. Chronic T6 compression deformity.             Assessment/Plan:    T11 compression fracture severe  Fall on 08/07/2020. Back pain following this. We discussed her imaging results as well as further treatment plan.    Dr. Tammy Sours Fischer had provided her with IR consult information to consider kyphoplasty. She has pretty  severe collapse at T11 and is at this point already 6 weeks from injury. She is welcome to an opinion from IR on kyphoplasty but will likely benefit more from bracing treatment over the next 6 weeks, as there are risks associated with surgery general anesthesia with the kyphoplasty.    Again she is welcome to consider kyphoplasty and consultation with IR at her convenience.    Today we discussed bracing with TLSO for the next 6 weeks. She would like to pursue this. Pt was fit with TLSO. Wear at all times except she can remove when laying down to sleep and for bathing purposes.    Tylenol #3 sent to pharmacy to help with fracture related pain. [place] PMP checked today.    Follow up in 5 weeks for recheck. We will plan to repeat xray then and hopefully she can start to wean out of the brace shortly after this.    Of note, she does have right EHL weakness on exam. Unknown length of time. We may consider MRI lumbar spine once healed from the fracture to eval for reasoning.    F/U earlier with any questions/concerns.        Also discussed pt's dexa scan results of low back and left hip today at her request. Left hip osteopenia.        There were no vitals filed for this visit.   Blood Pressure Screening - Preventive Care and Screening for High BP and Follow-Up  {QM MIPS HTN - Specialists:55620::"Pt was informed that the blood pressure measured at today's office visit was elevated above goal. ","Pt counseled on Lifestyle factors including those such as a heart healthy diet, weight management, alcohol intake, and/or exercise.","Advised patient to Follow-up with PCP for further monitoring and management of high blood pressure.":1}

## 2020-10-30 NOTE — Addendum Note (Signed)
Addended byIsaac Bliss, Evan Osburn on: 10/30/2020 01:13 PM     Modules accepted: Orders

## 2020-11-01 ENCOUNTER — Encounter (HOSPITAL_BASED_OUTPATIENT_CLINIC_OR_DEPARTMENT_OTHER): Payer: Self-pay | Admitting: Physician Assistant

## 2020-11-01 ENCOUNTER — Ambulatory Visit (HOSPITAL_BASED_OUTPATIENT_CLINIC_OR_DEPARTMENT_OTHER): Payer: HMO

## 2020-11-01 ENCOUNTER — Ambulatory Visit (HOSPITAL_BASED_OUTPATIENT_CLINIC_OR_DEPARTMENT_OTHER): Payer: HMO | Admitting: Physician Assistant

## 2020-11-01 VITALS — BP 144/96 | HR 78 | Ht 61.42 in | Wt 135.4 lb

## 2020-11-01 DIAGNOSIS — M5136 Other intervertebral disc degeneration, lumbar region: Secondary | ICD-10-CM

## 2020-11-01 DIAGNOSIS — S22080D Wedge compression fracture of T11-T12 vertebra, subsequent encounter for fracture with routine healing: Secondary | ICD-10-CM

## 2020-11-01 DIAGNOSIS — S22080S Wedge compression fracture of T11-T12 vertebra, sequela: Secondary | ICD-10-CM

## 2020-11-01 DIAGNOSIS — M25552 Pain in left hip: Secondary | ICD-10-CM

## 2020-11-01 DIAGNOSIS — M4316 Spondylolisthesis, lumbar region: Secondary | ICD-10-CM

## 2020-11-01 DIAGNOSIS — S32020A Wedge compression fracture of second lumbar vertebra, initial encounter for closed fracture: Secondary | ICD-10-CM

## 2020-12-04 ENCOUNTER — Ambulatory Visit (HOSPITAL_BASED_OUTPATIENT_CLINIC_OR_DEPARTMENT_OTHER): Payer: HMO | Admitting: Orthopaedic Surgery

## 2020-12-04 ENCOUNTER — Ambulatory Visit (INDEPENDENT_AMBULATORY_CARE_PROVIDER_SITE_OTHER): Payer: HMO | Admitting: Orthopaedic Surgery

## 2020-12-04 ENCOUNTER — Encounter (HOSPITAL_BASED_OUTPATIENT_CLINIC_OR_DEPARTMENT_OTHER): Payer: Self-pay | Admitting: Orthopaedic Surgery

## 2020-12-04 ENCOUNTER — Ambulatory Visit (HOSPITAL_BASED_OUTPATIENT_CLINIC_OR_DEPARTMENT_OTHER): Payer: HMO

## 2020-12-04 VITALS — BP 141/100 | HR 71 | Ht 61.5 in | Wt 135.0 lb

## 2020-12-04 DIAGNOSIS — S32020A Wedge compression fracture of second lumbar vertebra, initial encounter for closed fracture: Secondary | ICD-10-CM

## 2020-12-04 DIAGNOSIS — S22080S Wedge compression fracture of T11-T12 vertebra, sequela: Secondary | ICD-10-CM

## 2020-12-04 DIAGNOSIS — S32010A Wedge compression fracture of first lumbar vertebra, initial encounter for closed fracture: Secondary | ICD-10-CM

## 2020-12-04 NOTE — Progress Notes (Signed)
Patient last seen by Margret Chance, PA-C  5 13 2022     60 y/o female, comes into the office today for follow-up 16 weeks on T11 compression after a fall on 08/05/2020.  She also was found to have a superior endplate L2 fracture at that time.    Patient presents today for follow up evaluation and repeat x-rays for T11 and L2 compression fractures. Patient notes some ongoing pain in the pelvic region. She has been wearing the brace, as recommended. Overall, symptoms are mildly improving compared to when she was here for the previous office visit.  Patient states she continues to have significant pain along the back and midback area and low back/sacral area.        Previously seeing Dr. Karmen Bongo and had a cortisone injection on 09/12/2020 with some relief.  Dr. Chandra Batch provided her with a referral to IR for kyphoplasty but she decided not to pursue this.     For the pain, she is taking Tylenol with some relief. Does not take this every day.    Denies any numbness, tingling sensation or weakness in the B/L UE or LE. Denies bowel/bladder dysfunction or saddle anesthesias.       Current Outpatient Medications:   .  acetaminophen (TYLENOL) 500 MG tablet, Take 1,000 mg by mouth daily as needed  , Disp: , Rfl:   .  desloratadine (CLARINEX) 5 MG tablet, Take 5 mg by mouth daily., Disp: , Rfl:   .  dexlansoprazole (Dexilant) 60 MG capsule, Take 60 mg by mouth daily, Disp: , Rfl:   .  ergocalciferol (ERGOCALCIFEROL) 1.25 MG (50000 UT) capsule, 1 capsule, Disp: , Rfl:   .  hydrocortisone 2.5 % cream, APPLY TO AFFECTED AREA TWICE DAILY, Disp: , Rfl:   .  metoprolol (LOPRESSOR) 100 MG tablet, Take 100 mg by mouth daily., Disp: , Rfl:   .  rivaroxaban (XARELTO) 10 MG Tab, Take 10 mg by mouth daily  , Disp: , Rfl:   .  timolol (TIMOPTIC) 0.5 % ophthalmic solution, Place 1 drop into the left eye daily  , Disp: , Rfl:      Allergies   Allergen Reactions   . Latex Itching        Past Medical History:   Diagnosis Date   . Chronic  ulcerative colitis     nio issues for long time   . Claustrophobia    . Clotting disorder     DVT LLE 2012   . Deep venous thrombosis of calf 2012    LLL/on xeralto//   . GERD (gastroesophageal reflux disease)    . Glaucoma NEC     bilat   . Hypertensive disorder     controlled well with medication   . Ovarian cyst, right    . Plantar fasciitis    . Seasonal allergies         Past Surgical History:   Procedure Laterality Date   . BUNIONECTOMY      left foot   . CATARACT EXTRACTION     . EYE SURGERY Bilateral 2013    laser surgery for glaucoma, bilateral trabeculectomy   . GLAUCOMA SURGERY     . HYSTERECTOMY      removed both ovaries and tubes   . revision of hysterectomyl suture line     . REVISION, BLEB, SCLERAL PATCH GRAFT Left 05/25/2019    Procedure: REVISION, BLEB;  Surgeon: Lamount Cranker, MD;  Location: [facility];  Service: Ophthalmology;  Laterality: Left;   . TONSILLECTOMY     . TRABECULECTOMY Left 03/22/2014    Procedure: TRABECULECTOMY;  Surgeon: Pecola Leisure, MD;  Location: [facility];  Service: Ophthalmology;  Laterality: Left;  TRABECULECTOMY REVISION, LEFT EYE  Q1=N  ANES=MAC  EQUIP=MITOMYCIN C, 0.4mg /cc, DISPENSE 5cc (ORDER TO FOLLOW)  MD REQ 30 MIN  ALLERGIES=COSOPT, AZOPT, ALPHAGAN  **Need RESIGHT SET from SPD   . ureterectomy left          12 Point Review of Systems:  noncontributory, except for what's listed in history.  Review of Systems   Constitutional: Negative.  Negative for chills and fever.   HENT: Negative.    Eyes: Negative.    Respiratory: Negative.    Cardiovascular: Negative.    Gastrointestinal: Negative.    Genitourinary: Negative.    Musculoskeletal: Positive for back pain.   Skin: Negative.    Neurological: Negative.  Negative for tingling and weakness.   Endo/Heme/Allergies: Negative.    Psychiatric/Behavioral: Negative.       Social History : No changes from listed in chart.             PHYSICAL EXAM:  Constitutional: Pt Looks comfortable, breathing well, in no  distress.  Patient is not wearing the brace today    Weight Status - Normal for age    Gait: Assistive devices - None    Psychiatric:  PT appears to be alert and orientated with stable psychiatric evaluation.  Waddell Signs:  Negative    Musculoskeletal:      Lumbar:   Range of Motion: Limited  Flexion not tested due to new fracture inches above floor  Extension 0 degrees    Tenderness:   Midline Thoracic Spine: Nontender to palpation today  Midline Lumbar Spine: None  Iliolumbar Region: None  SI joint Region: None  Greater Trochanteric Region: None    Straight Leg Raising: Negative  Crossed SLR:  Negative  Femoral Nerve Stretch Test:  Negative  FABER test: Negative bilaterally  Hip range of motion: Without pain, full bilaterally    Neurologic:    Motor function lower extremities:    Right:  Iliopsoas 5/5   Quadriceps 5/5  Anterior tibialis 5/5   EHL 4/5  Gastroc 5/5     Left:  Iliopsoas 5/5   Quadriceps 5/5  Anterior tibialis 5/5   EHL 4/5(prior bunion sx)  Gastroc 5/5       Deep Tendon Reflexes:  2/2 Left knee, 2/2 Right Knee, 2/2 Left ankle,  2/2 Right ankle      Sensory Examination: Normal to light touch along bilateral lower extremities    Babinski:  Negative  Clonus: Negative    Skin:  Without rashes/ lesions    Vasculature:  Capillary refill intact UE's, LE's  Radial pulses intact.    Hematologic/Lymph:  No evidence of Pitting Edema in LE's, No UE lymphedema, no signs of DVT or Homans sign, no calf pain or swelling            Imaging Studies:  XR thoracolumbar spine 10-17-20 shows T11 vertebra plana. Thoracic levocurvature. Spondylolisthesis L3-4. Severe disc space collapse at L4-5. Diffuse degenerative changes throughout thoracic and lumbar spine.    MRI thoracic spine showing T11 compression fracture resulting in mild central stenosis. Chronic T6 compression deformity.     XR thoracolumbar spine 12/04/2020 shows T11 vertebra plana. Also superior endplate compression deformity at L2 today which did not appear  present on October 17, 2020 plain films.  XR left hip and pelvis 11/01/20 no obvious fractures or acute changes seen.    Lumbar spine x-rays 12/04/2020:Complete degenerative collapse L4-5, spondylolisthesis seen at L3-4, endplate superior fracture of L3 noted as well as L2 noted.  L1 superior endplate fracture noted.  Vertebral plana seen at T11.  It appears that the L1 fracture may be new since May.  Possibly a new osteoporotic fracture at L1 superior endplate noted.        Assessment/Plan:    T11 compression fracture severe  L2 compression fracture, new  Superior endplate L1 fracture new      Patient presents for follow-up T11 and L2 fracture (diagnosed last visit in may) compression fracture.      We need to wait for 3 months, into August, for healing of the L2 fracture as well.      Unsure of length of time of EHL weakness, and given her progression of fractures to L1 MRI lumbar spine to further eval.    Follow-up with medicine regarding osteoporosis.    Also discussed the importance of wearing her brace to prevent further collapse of L1 and L2.            Vitals:    12/04/20 1041   BP: (!) 141/100   Pulse: 71   SpO2: 100%   Hx of HTN.    Blood Pressure Screening - Preventive Care and Screening for High BP and Follow-Up    QM MIPS HTN - Specialists: Pt was informed that the blood pressure measured at today's office visit was elevated above goal.   Pt counseled on Lifestyle factors including those such as a heart healthy diet, weight management, alcohol intake, and/or exercise.  Advised patient to Follow-up with PCP for further monitoring and management of high blood pressure.

## 2020-12-24 NOTE — Progress Notes (Signed)
60 y/o female, comes into the office today for follow-up on lumbar, thoracic and sacral pain.    Hx of T11 compression fracture after a fall on 08/05/2020    Last seen in the office on 12/04/2020.    New appearing L1 compression deformity on xrays at last office visit. Pt was advised to continue to wear brace.     MRI lumbar spine order was provided and she is here to review.    She is not wearing the TLSO today in the office but states that she does wear it sometimes at home.    Continues to have pain in the right sacral region.  Continues to have low back pain.    Denies any injury after the fall that she had in February 2022.    Previously seeing Dr. Karmen Bongo and had a cortisone injection on 09/12/2020 with some relief.  Dr. Chandra Batch provided her with a referral to IR for kyphoplasty but she decided not to pursue this.     For the pain, she is taking Tylenol with some relief. Does not take this every day.    Denies any numbness, tingling sensation or weakness in the B/L UE or LE. Denies bowel/bladder dysfunction or saddle anesthesias.       Current Outpatient Medications:     acetaminophen (TYLENOL) 500 MG tablet, Take 1,000 mg by mouth daily as needed  , Disp: , Rfl:     desloratadine (CLARINEX) 5 MG tablet, Take 5 mg by mouth daily., Disp: , Rfl:     dexlansoprazole (Dexilant) 60 MG capsule, Take 60 mg by mouth daily, Disp: , Rfl:     ergocalciferol (ERGOCALCIFEROL) 1.25 MG (50000 UT) capsule, 1 capsule, Disp: , Rfl:     hydrocortisone 2.5 % cream, APPLY TO AFFECTED AREA TWICE DAILY, Disp: , Rfl:     metoprolol (LOPRESSOR) 100 MG tablet, Take 100 mg by mouth daily., Disp: , Rfl:     rivaroxaban (XARELTO) 10 MG Tab, Take 10 mg by mouth daily  , Disp: , Rfl:     timolol (TIMOPTIC) 0.5 % ophthalmic solution, Place 1 drop into the left eye daily  , Disp: , Rfl:      Allergies   Allergen Reactions    Latex Itching        Past Medical History:   Diagnosis Date    Chronic ulcerative colitis     nio issues for long  time    Claustrophobia     Clotting disorder     DVT LLE 2012    Deep venous thrombosis of calf 2012    LLL/on xeralto//    GERD (gastroesophageal reflux disease)     Glaucoma NEC     bilat    Hypertensive disorder     controlled well with medication    Ovarian cyst, right     Plantar fasciitis     Seasonal allergies         Past Surgical History:   Procedure Laterality Date    BUNIONECTOMY      left foot    CATARACT EXTRACTION      EYE SURGERY Bilateral 2013    laser surgery for glaucoma, bilateral trabeculectomy    GLAUCOMA SURGERY      HYSTERECTOMY      removed both ovaries and tubes    revision of hysterectomyl suture line      REVISION, BLEB, SCLERAL PATCH GRAFT Left 05/25/2019    Procedure: REVISION, BLEB;  Surgeon: Ninfa Linden, Hughie Closs  I, MD;  Location: [facility];  Service: Ophthalmology;  Laterality: Left;    TONSILLECTOMY      TRABECULECTOMY Left 03/22/2014    Procedure: TRABECULECTOMY;  Surgeon: Pecola Leisure, MD;  Location: [facility];  Service: Ophthalmology;  Laterality: Left;  TRABECULECTOMY REVISION, LEFT EYE  Q1=N  ANES=MAC  EQUIP=MITOMYCIN C, 0.4mg /cc, DISPENSE 5cc (ORDER TO FOLLOW)  MD REQ 30 MIN  ALLERGIES=COSOPT, AZOPT, ALPHAGAN  **Need RESIGHT SET from SPD    ureterectomy left          12 Point Review of Systems:  noncontributory, except for what's listed in history.  Review of Systems   Constitutional: Negative.  Negative for chills and fever.   HENT: Negative.     Eyes: Negative.    Respiratory: Negative.     Cardiovascular: Negative.    Gastrointestinal: Negative.    Genitourinary: Negative.    Musculoskeletal:  Positive for back pain.   Skin: Negative.    Neurological: Negative.  Negative for tingling and weakness.   Endo/Heme/Allergies: Negative.    Psychiatric/Behavioral: Negative.      Social History : No changes from listed in chart.             PHYSICAL EXAM:  Constitutional: Pt Looks comfortable, breathing well, in no distress.  Patient is not wearing the brace today    Weight  Status - Normal for age    Gait: Assistive devices - None    Psychiatric:  PT appears to be alert and orientated with stable psychiatric evaluation.  Waddell Signs:  Negative    Musculoskeletal:      Lumbar:   Range of Motion: Limited  Flexion not tested due to new fracture inches above floor  Extension 0 degrees    Tenderness:   Midline Thoracic Spine: Lower thoracic - upper lumbar region  Midline Lumbar Spine: Lower thoracic - upper lumbar region  Iliolumbar Region: None  SI joint Region: TTP bilateral sacral region  Greater Trochanteric Region: None    Straight Leg Raising: Negative  Hip range of motion: Without pain with ROM    Neurologic:    Motor function lower extremities:    Right:  Iliopsoas 5/5   Quadriceps 5/5  Anterior tibialis 5/5   EHL 4/5  Gastroc 5/5     Left:  Iliopsoas 5/5   Quadriceps 5/5  Anterior tibialis 5/5   EHL 4/5(prior bunion sx)  Gastroc 5/5     Deep Tendon Reflexes:  2/2 Left knee, 2/2 Right Knee, 2/2 Left ankle,  2/2 Right ankle    Sensory Examination: Normal to light touch along bilateral lower extremities    Babinski:  Negative  Clonus: Negative    Skin:  Without rashes/ lesions    Vasculature:  Capillary refill intact UE's, LE's    Hematologic/Lymph:  No evidence of Pitting Edema in LE's, No UE lymphedema, no signs of DVT or Homans sign, no calf pain or swelling            Imaging Studies:  MRI thoracic spine 09/06/20: T11 compression fracture resulting in mild central stenosis. Chronic T6 compression deformity.     XR thoracolumbar spine 09/27/20: T11 vertebra plana. Thoracic levocurvature. Spondylolisthesis L3-4. Severe disc space collapse at L4-5. Diffuse degenerative changes throughout thoracic and lumbar spine.    XR left hip and pelvis 11/01/20: No obvious fractures or acute changes seen.    XR thoracolumbar spine 12/04/2020 shows T11 vertebra plana. Also superior endplate compression deformity at L2  today which did not appear present on 09/27/20 plain films.    Lumbar spine x-rays  12/04/2020: Complete degenerative collapse L4-5, spondylolisthesis seen at L3-4, endplate superior fracture of L3 noted as well as L2 noted.  L1 superior endplate fracture noted.  Vertebral plana seen at T11.  It appears that the L1 fracture may be new since May.  Possibly a new osteoporotic fracture at L1 superior endplate noted.          MRI lumbar spine 12/26/2020: Subacute L1 compression fracture, subacute L2 compression fracture, age-indeterminate L3 compression fracture.  Right sacral fracture which appears more acute.    By MRI report heterogeneous bone marrow signal which may be related to edema related to the patient's fractures.  However there are ill-defined T1 hypodense areas.  Underlying malignancy and other pathology not excluded.              Assessment/Plan:    T11 compression fracture severe  L2 compression fracture, new  Superior endplate L1 fracture new  L3 compression fracture  Right sacral fracture    Patient denies any recent injury yet continues to have new compression fractures.  Also with a right sacral fracture without any known injury.  Concern for malignancy.  She denies any recent weight loss, fevers, chills.    Discussed her MRI in detail today.  Reviewed prior x-rays with the patient today.    She should continue to wear her TLSO at all times except when she is laying down to sleep.  She is not wearing this today.    Order for bone scan was provided.    Follow-up after bone scan.        Vitals:    12/27/20 1120   BP: 132/88   Pulse: 73   Hx of HTN.        Blood Pressure Screening - Preventive Care and Screening for High BP and Follow-Up  QM MIPS HTN - Specialists: Pt was informed that the blood pressure measured at today's office visit was elevated above goal.   Pt counseled on Lifestyle factors including those such as a heart healthy diet, weight management, alcohol intake, and/or exercise.  Advised patient to Follow-up with PCP for further monitoring and management of high blood  pressure.          *The review of the patient's medications does not in any way constitute an endorsement, by this clinician, of their use, dosage, indications, route, efficacy, interactions, or other clinical parameters.    This note was generated within the [employer] EMR using Dragon medical speech recognition software and may contain inherent errors or omissions not intended by the user. Grammatical and punctuation errors, random word insertions, deletions, pronoun errors and incomplete sentences are occasional consequences of this technology due to software limitations. Not all errors are caught or corrected.  Although every attempt is made to root out erroneous and incomplete transcription, the note may still not fully represent the intent or opinion of the author. If there are questions or concerns about the content of this note or information contained within the body of this dictation, they should be addressed directly with the author for clarification*

## 2020-12-27 ENCOUNTER — Encounter (HOSPITAL_BASED_OUTPATIENT_CLINIC_OR_DEPARTMENT_OTHER): Payer: Self-pay | Admitting: Orthopaedic Surgery

## 2020-12-27 ENCOUNTER — Ambulatory Visit (INDEPENDENT_AMBULATORY_CARE_PROVIDER_SITE_OTHER): Payer: HMO | Admitting: Physician Assistant

## 2020-12-27 ENCOUNTER — Encounter (HOSPITAL_BASED_OUTPATIENT_CLINIC_OR_DEPARTMENT_OTHER): Payer: Self-pay | Admitting: Physician Assistant

## 2020-12-27 VITALS — BP 132/88 | HR 73 | Ht 61.5 in | Wt 130.0 lb

## 2020-12-27 DIAGNOSIS — M4316 Spondylolisthesis, lumbar region: Secondary | ICD-10-CM

## 2020-12-27 DIAGNOSIS — S32010D Wedge compression fracture of first lumbar vertebra, subsequent encounter for fracture with routine healing: Secondary | ICD-10-CM

## 2020-12-27 DIAGNOSIS — M5136 Other intervertebral disc degeneration, lumbar region: Secondary | ICD-10-CM

## 2020-12-27 DIAGNOSIS — S3210XA Unspecified fracture of sacrum, initial encounter for closed fracture: Secondary | ICD-10-CM

## 2020-12-27 DIAGNOSIS — M5134 Other intervertebral disc degeneration, thoracic region: Secondary | ICD-10-CM

## 2020-12-27 DIAGNOSIS — S32020A Wedge compression fracture of second lumbar vertebra, initial encounter for closed fracture: Secondary | ICD-10-CM

## 2020-12-27 DIAGNOSIS — S22080D Wedge compression fracture of T11-T12 vertebra, subsequent encounter for fracture with routine healing: Secondary | ICD-10-CM

## 2021-01-13 ENCOUNTER — Telehealth: Payer: Self-pay | Admitting: Physician Assistant

## 2021-01-14 ENCOUNTER — Ambulatory Visit
Admission: RE | Admit: 2021-01-14 | Discharge: 2021-01-14 | Disposition: A | Discharge status: Home / Self Care | Payer: HMO | Source: Ambulatory Visit | Attending: Anesthesiology | Admitting: Anesthesiology

## 2021-01-14 VITALS — BP 114/79 | HR 86 | Temp 98.0°F | Ht 61.42 in | Wt 130.1 lb

## 2021-01-14 DIAGNOSIS — S22080S Wedge compression fracture of T11-T12 vertebra, sequela: Secondary | ICD-10-CM

## 2021-01-14 DIAGNOSIS — M545 Low back pain, unspecified: Secondary | ICD-10-CM

## 2021-01-14 DIAGNOSIS — M25552 Pain in left hip: Secondary | ICD-10-CM

## 2021-01-14 DIAGNOSIS — M7918 Myalgia, other site: Secondary | ICD-10-CM

## 2021-01-14 NOTE — Progress Notes (Addendum)
Patient Name: [name]     Age: 60 year(s) female    DOB: 21-Jan-1961     MRN: [id number]       CHIEF COMPLAINT:  Left inner thigh perineum pain after a reaching for something      INTERVAL HISTORY: [name] is a 60 year old female A. fib on Xarelto with history of multilevel compression fracture who is here today for a follow-up appointment.     Most recently I saw her 10/15/2020 for left thoracic MBBs at T10, T11 for thoracic compression fracture.   Since that visit, states that her mid-back has been doing a little better.  In the interim she was diagnosed with a L2 compression fracture.  She was seen by Dr. Georgette Shell on 6/15, found to have EHL weakness, he instructed her to wear a brace.  Repeat MRI lumbar spine obtained at [facility], no report available to [place].     She reports about a week ago, after MRI lumbar was obtained she was reaching into her cabinet and experienced excruciating pain in her left thigh radiating to her perineum.  Pain has been persistent and debilitating, she has a hard time ambulating.     She called our clinic yesterday for severe debilitating pain and was instructed to go to the emergency room but patient deferred. She is insistent on getting a " cortisone injection" to help with her pain.  Discussed with the patient that since we do not know origin of her pain we need additional imaging to ensure we havent missed anything.     The patient denies any changes in numbness, weakness, bowel or bladder function.     Patient is chroically anticoagulated on Xarelto, she tells me that her last dose was Saturday.  Discussed with the patient it is appropriate to hold anticoagulation without MD supervision or direction.      MEDICAL HISTORY:  Past Medical History:   Diagnosis Date    Chronic ulcerative colitis     nio issues for long time    Claustrophobia     Clotting disorder     DVT LLE 2012    Deep venous thrombosis of calf 2012    LLL/on xeralto//    GERD (gastroesophageal reflux  disease)     Glaucoma NEC     bilat    Hypertensive disorder     controlled well with medication    Ovarian cyst, right     Plantar fasciitis     Seasonal allergies         SURGICAL HISTORY:  Past Surgical History:   Procedure Laterality Date    BUNIONECTOMY      left foot    CATARACT EXTRACTION      EYE SURGERY Bilateral 2013    laser surgery for glaucoma, bilateral trabeculectomy    GLAUCOMA SURGERY      HYSTERECTOMY      removed both ovaries and tubes    revision of hysterectomyl suture line      REVISION, BLEB, SCLERAL PATCH GRAFT Left 05/25/2019    Procedure: REVISION, BLEB;  Surgeon: Lamount Cranker, MD;  Location: [facility];  Service: Ophthalmology;  Laterality: Left;    TONSILLECTOMY      TRABECULECTOMY Left 03/22/2014    Procedure: TRABECULECTOMY;  Surgeon: Pecola Leisure, MD;  Location: [facility];  Service: Ophthalmology;  Laterality: Left;  TRABECULECTOMY REVISION, LEFT EYE  Q1=N  ANES=MAC  EQUIP=MITOMYCIN C, 0.4mg /cc, DISPENSE 5cc (ORDER  TO FOLLOW)  MD REQ 30 MIN  ALLERGIES=COSOPT, AZOPT, ALPHAGAN  **Need RESIGHT SET from [facility]    ureterectomy left            SOCIAL HISTORY:  Alcohol - Occasionally  Drug Use - Denies  Employment: Part Time - [employer]  Marital Status: Married      ALLERGIES:   Allergies   Allergen Reactions    Latex Itching          MEDICATIONS:  Current Outpatient Medications on File Prior to Encounter   Medication Sig Dispense Refill    acetaminophen (TYLENOL) 500 MG tablet Take 1,000 mg by mouth daily as needed         desloratadine (CLARINEX) 5 MG tablet Take 5 mg by mouth daily.      dexlansoprazole (Dexilant) 60 MG capsule Take 60 mg by mouth daily      ergocalciferol (ERGOCALCIFEROL) 1.25 MG (50000 UT) capsule 1 capsule      hydrocortisone 2.5 % cream APPLY TO AFFECTED AREA TWICE DAILY      metoprolol (LOPRESSOR) 100 MG tablet Take 100 mg by mouth daily.      rivaroxaban (XARELTO) 10 MG Tab Take 10 mg by mouth daily         timolol (TIMOPTIC) 0.5 % ophthalmic  solution Place 1 drop into the left eye daily          No current facility-administered medications on file prior to encounter.            PHYSICAL EXAM:  VITALS:  Vitals:    01/14/21 1301   BP: 114/79   Pulse: 86   Temp: 98 F (36.7 C)   SpO2: 99%     GENERAL: This patient is well developed, well nourished and alert and oriented times three with intact speech, pattern, content, and tone.  Language, attention, cognition and memory were intact.     ABDOMEN: Non-distended   NEUROLOGICAL: Grossly intact   SPINE: Exaggerated thoracic kyphosis.  Decreased range of motion of the lumbar spine.  Tenderness to palpation of the lumbar paraspinal muscles at L2.   SKIN: No evidence of swelling, edema, rashes, or infection on exposed areas of skin.    PSYCHIATRIC:   The patient is alert and oriented x 4.  The patient's affect is appropriate.  The patient does not appear sedated or somnolent on exam.      Radiographic Findings: XR thoracolumbar spine 09/27/20 shows T11 vertebra plana. Thoracic levocurvature. Spondylolisthesis L3-4. Severe disc space collapse at L4-5. Diffuse degenerative changes throughout thoracic and lumbar spine.     XR thoracolumbar spine 11/01/20 shows T11 vertebra plana. Also superior endplate compression deformity at L2 today which did not appear present on 09/27/20 plain films.     XR left hip and pelvis 11/01/20 no obvious fractures or acute changes seen.    MRI thoracic spine 09/06/2020  T11 burst fracture with 75% loss of height centrally and moderate posterior bony retropulsion resulting in mild to moderate central canal stenosis and moderate bilateral foraminal canal stenosis.     Assessment:  [name] is a 60 year old female A. fib on Xarelto with history of multilevel compression fracture who is here today for a follow-up appointment. Most recently I saw her 10/15/2020 for left thoracic MBBs at T10, T11 for thoracic compression fracture. Repeat MRI lumbar spine obtained at [facility], no report  available to [place] at the visit today. Presenting to [place] with new complaint of inner thigh  pain w radiation into her perineum after reaching into her cabinet.  Pain has been persistent and debilitating, she has a hard time ambulating.  Reassuringly neuro exam intact.   Long discussion with patient about needing additional imaging prior to injection, since this is a new pain, possibly myofascial possibly L1 L2 nerve impingement. She is insistent on getting a " cortisone injection" to help with her pain.  Discussed with the patient that since we do not know origin of her pain we need additional imaging to ensure we havent missed anything.     diagnosis  1. Wedge compression fracture of t11-T12 vertebra, sequela  XR Lumbar Spine Including Flexion And Extension Min 6 Views      2. Myalgia, other site        3. Chronic bilateral low back pain, unspecified whether sciatica present        4. Pain in joint involving left pelvic region and thigh             PLAN:  Lumbar X ray flex ex ordered   Will [facility] lumbar [facility] pending report faxed over  Follow up with surgeon as scheduled.  Discussed using tylenol, ibuprofen, heat and ice therapy for myofascial pain  Return to clinic after imaging done.         30 min direct patient care

## 2021-01-14 NOTE — Telephone Encounter (Signed)
Patient left message.  States that she was bending to get something out of a cupboard and twisted.  Pulled a muscle in her left thigh.  Has had severe pain in her left buttock and thigh ever since.  Patient states she is unable to walk.  Patient has tried codeine with no benefit.  Saw ortho on 12/27/2020 and diagnosed with new compression fracture, T11, L1, L2, L3, right sacral.  Patient was sent for bone scan.  Patient has scheduled.  Recommended patient go to the ER since pain is severe.  Patient states she would prefer not to since her insurance does not pay for hospital visits.  Scheduled for appointment with Dr. Peggye Fothergill tomorrow, 01/14/2021.

## 2021-01-15 ENCOUNTER — Telehealth: Payer: Self-pay | Admitting: Physician Assistant

## 2021-01-15 ENCOUNTER — Ambulatory Visit: Payer: HMO

## 2021-01-15 DIAGNOSIS — M545 Low back pain, unspecified: Secondary | ICD-10-CM

## 2021-01-15 DIAGNOSIS — G8929 Other chronic pain: Secondary | ICD-10-CM

## 2021-01-15 MED ORDER — HYDROCODONE-ACETAMINOPHEN 5-325 MG PO TABS
1.00 | ORAL_TABLET | Freq: Three times a day (TID) | ORAL | 0 refills | Status: AC | PRN
Start: 2021-01-15 — End: 2021-01-20

## 2021-01-15 NOTE — Telephone Encounter (Signed)
Patient called.  Was seen for appointment yesterday, 01/14/2021.  Patient will try to go for x-ray tomorrow.  Would like to get a pain medication to help since she is unable to walk.  Told will send a short course of Norco to help with the pain.  Patient verbalized understanding.

## 2021-01-20 ENCOUNTER — Ambulatory Visit
Admission: RE | Admit: 2021-01-20 | Discharge: 2021-01-20 | Disposition: A | Discharge status: Home / Self Care | Payer: HMO | Source: Ambulatory Visit | Attending: Anesthesiology | Admitting: Anesthesiology

## 2021-01-20 DIAGNOSIS — S22080S Wedge compression fracture of T11-T12 vertebra, sequela: Secondary | ICD-10-CM | POA: Insufficient documentation

## 2021-01-21 ENCOUNTER — Ambulatory Visit: Payer: HMO | Admitting: Physician Assistant

## 2021-01-21 ENCOUNTER — Telehealth: Payer: Self-pay | Admitting: Physician Assistant

## 2021-01-21 DIAGNOSIS — G8929 Other chronic pain: Secondary | ICD-10-CM

## 2021-01-21 DIAGNOSIS — M545 Low back pain, unspecified: Secondary | ICD-10-CM

## 2021-01-21 DIAGNOSIS — S3210XD Unspecified fracture of sacrum, subsequent encounter for fracture with routine healing: Secondary | ICD-10-CM

## 2021-01-21 MED ORDER — OXYCODONE-ACETAMINOPHEN 5-325 MG PO TABS
1.00 | ORAL_TABLET | Freq: Four times a day (QID) | ORAL | 0 refills | Status: AC | PRN
Start: 2021-01-21 — End: 2021-01-26

## 2021-01-21 NOTE — Telephone Encounter (Signed)
Spoke with patient.  MRI report reviewed.  Shows areas of heterogeneous bone marrow which likely is edema, but can not rule out malignancy.  Patient has order for bone scan but states she is unable to get done.  Patient unable to walk due to pain.  Patient would like injection.  Told do not recommend injection at this time until malignancy ruled out since injection could cause possible spread.  Patient does not think she can get bone scan done in a timely manner.  Told will send order for MRI with contrast.  Patient agrees.  Will send order to [facility].  Patient states that Norco has not been very helpful.  Does make her sleepy.  Told can try oxycodone, but could also make tired.  Patient would like to try.  Will send short course of oxycodone to pharmacy.  Patient told to call after getting imaging done and will discuss results.  Patient told that if pain remains severe and still unable to walk, to go to the ER to get pain under control.

## 2021-01-22 ENCOUNTER — Ambulatory Visit: Payer: HMO

## 2021-01-23 ENCOUNTER — Ambulatory Visit: Payer: Self-pay | Admitting: Anesthesiology

## 2021-01-30 ENCOUNTER — Ambulatory Visit (HOSPITAL_BASED_OUTPATIENT_CLINIC_OR_DEPARTMENT_OTHER): Payer: HMO | Admitting: Physician Assistant

## 2021-02-04 ENCOUNTER — Telehealth: Payer: Self-pay

## 2021-02-04 NOTE — Telephone Encounter (Signed)
Pt reported that she is having her MRI done on the 02/06/2021. Pt is provided with the fax number for the clinic.

## 2021-02-06 ENCOUNTER — Encounter: Payer: Self-pay | Admitting: Surgery

## 2021-02-17 ENCOUNTER — Encounter: Payer: Self-pay | Admitting: Anesthesiology

## 2021-02-17 ENCOUNTER — Inpatient Hospital Stay: Admission: RE | Admit: 2021-02-17 | Payer: Self-pay | Source: Ambulatory Visit

## 2021-02-17 ENCOUNTER — Ambulatory Visit
Admission: RE | Admit: 2021-02-17 | Discharge: 2021-02-17 | Disposition: A | Discharge status: Home / Self Care | Payer: HMO | Source: Ambulatory Visit | Attending: Anesthesiology | Admitting: Anesthesiology

## 2021-02-17 VITALS — BP 112/81 | HR 79 | Temp 98.1°F | Ht 62.0 in | Wt 117.0 lb

## 2021-02-17 DIAGNOSIS — M1612 Unilateral primary osteoarthritis, left hip: Secondary | ICD-10-CM | POA: Insufficient documentation

## 2021-02-17 DIAGNOSIS — M25552 Pain in left hip: Secondary | ICD-10-CM

## 2021-02-17 DIAGNOSIS — Z7901 Long term (current) use of anticoagulants: Secondary | ICD-10-CM | POA: Insufficient documentation

## 2021-02-17 DIAGNOSIS — M48061 Spinal stenosis, lumbar region without neurogenic claudication: Secondary | ICD-10-CM | POA: Insufficient documentation

## 2021-02-17 DIAGNOSIS — Z79899 Other long term (current) drug therapy: Secondary | ICD-10-CM | POA: Insufficient documentation

## 2021-02-17 DIAGNOSIS — M47816 Spondylosis without myelopathy or radiculopathy, lumbar region: Secondary | ICD-10-CM | POA: Insufficient documentation

## 2021-02-17 MED ORDER — TRIAMCINOLONE ACETONIDE 40 MG/ML IJ SUSP
INTRAMUSCULAR | Status: AC
Start: 2021-02-17 — End: ?
  Filled 2021-02-17: qty 1

## 2021-02-17 MED ORDER — BUPIVACAINE HCL (PF) 0.25 % IJ SOLN
INTRAMUSCULAR | Status: AC
Start: 2021-02-17 — End: ?
  Filled 2021-02-17: qty 10

## 2021-02-17 MED ORDER — OXYCODONE HCL 5 MG PO TABS
5.00 mg | ORAL_TABLET | Freq: Three times a day (TID) | ORAL | 0 refills | Status: AC | PRN
Start: 2021-02-17 — End: 2021-02-21

## 2021-02-17 NOTE — Patient Instructions (Signed)
[facility]  [facility]  [facility]  [address], [address]  [place], [place] [address]  T: [phone]    What to expect    It is normal to feel improvement in pain for a few hours if local anesthetic used for the procedure.  If no local anesthetic used for the procedure, or once the local anesthetic wears off, you may feel worsening of your pain for a few days.  If you are tender at the injection site you may use an ice pack wrapped in a towel for twenty (20) minutes three to four times a day    The pain relief from the steroid typically takes 2 to 5 days to see an effect.    Instructions    No immersion in water for 24 hours (no bath, no hot tub, no Jacuzzi etc.)  May shower after 6 hours if desired    You may resume your normal diet and medication regimen unless directed otherwise by your provider.  If you are taking a blood thinner, ask when it is acceptable to restart as this will vary depending upon the medication and the procedure you underwent    You may resume regular activities as your comfort level allows, but do not do more than you would on a typical day    The skin in the area where your procedure was performed, will have a discoloration from the skin cleanser used for the procedure.  The current recommendation is not to wash this off immediately following the procedure, unless it is causing problems like itching or rash.  The disinfectant used is typically colored blue or orange.  This is normal and you may wash it off after 2 hours if desired.    You may remove bandage that we placed over the injection site after 2 hours, if desired    Potential Side effects    Temporary side effects of steroids could include:  Redness in the face  Swelling in the extremities  Insomnia  Increase in blood sugar level    If local anesthesia used for the procedure may have mild and temporary numbness or weakness.  This typically resolves in a few hours.  If  you notice severe numbness or weakness please call the office (or if after hours go to emergency room for evaluation).  If the numbness or weakness does not resolve after 12 hours please call the office.    When to call the office    Please call the office for the following:  Temperature greater than 100.3 °F    Severe hip pain that does not go away with over-the-counter medications  New onset numbness or weakness that is progressively getting worse  Redness at the injection site that is getting bigger, or discharge from the injection site

## 2021-02-17 NOTE — Procedures (Signed)
[facility]  [facility]  [facility]  [address]  [place], [place] [address]  T: [phone] F: [phone]    [name]   DOB: July 27, 1960   MRN: [id number]   Date of Service: February 17, 2021      Pain Management Physician: Karmen Bongo, MD  PCP: Daisey Must D, MD    Procedure Note:    Diagnosis:   Left Hip Pain  Osteoarthritis, Left Hip    Procedure: Left Intraarticular Hip Injection with Fluoroscopic Guidance    Anesthesia: Local    Indications:     [name] is a 60 yo female with severe and refractory mid & low back pain, but most bothersome is left groin pain.    Her pain has persisted despite medications, exercises and time.   She presents today for alternative treatment options.    Interval History:    [name] last was seen by my partner, Dr Peggye Fothergill, on January 14, 2021 for follow up visit.   She was referred for MRI of sacrum  and lumbar spine which she had done at [facility].  Findings showed degenerative changes in the lumbar spine, as well as the known compression fractures and stenosis.  Sacral MRI showed insufficiency fractures of the right ileum and arthritic changes in the SI joints.    Pain History:    Her pain began acutely after reaching for an object.  She denies numbness or weakness.  Pain worse with walking & better with rest.  No bowel or bladder dysfunction.  Pelvic x-rays showed mild degenerative changes.    Current Outpatient Medications   Medication Sig Dispense Refill    desloratadine (CLARINEX) 5 MG tablet Take 5 mg by mouth daily.      dexlansoprazole (Dexilant) 60 MG capsule Take 60 mg by mouth daily      ergocalciferol (ERGOCALCIFEROL) 1.25 MG (50000 UT) capsule 1 capsule      hydrocortisone 2.5 % cream APPLY TO AFFECTED AREA TWICE DAILY      metoprolol (LOPRESSOR) 100 MG tablet Take 100 mg by mouth daily.      oxyCODONE (ROXICODONE) 5 MG immediate release tablet Take 1 tablet (5 mg total) by mouth every  8 (eight) hours as needed for Pain 12 tablet 0    rivaroxaban (XARELTO) 10 MG Tab Take 10 mg by mouth daily         timolol (TIMOPTIC) 0.5 % ophthalmic solution Place 1 drop into the left eye daily          No current facility-administered medications for this encounter.        Allergies   Allergen Reactions    Latex Itching        Social History     Tobacco Use    Smoking status: Former     Types: Cigarettes     Quit date: 12/21/2010     Years since quitting: 10.1    Smokeless tobacco: Never   Vaping Use    Vaping Use: Never used   Substance Use Topics    Alcohol use: Yes     Alcohol/week: 1.0 standard drink     Types: 1 Cans of beer per week     Comment: 1/week    Drug use: No     Physical Exam   Visit Vitals  BP 112/81 (BP Site: Left arm)   Pulse 79   Temp 98.1 F (36.7 C) (Oral)   Ht  1.575 m (5\' 2" )   Wt 53.1 kg (117 lb)   SpO2 99%   BMI 21.40 kg/m      GENERAL: Well developed, well nourished, no acute distress.  Normal mood and affect.   ABDOMEN: Non-distended.   NEUROLOGIC: Sensory and Motor exam grossly intact; gait -- antalgic  SPINE/MUSCULOSKELETAL:   ROM right hip: decreased, painful   Spinal curvature: kyphotic   No palpable trigger points.   SKIN: No evidence of swelling, edema, rashes, or infection on exposed areas of skin.   PSYCHIATRIC: The patient is alert and oriented.  Affect is appropriate.  she does not appear sedated or somnolent on exam.     Description of Procedure:     The risks, benefits and alternatives to the procedure were reviewed, and the patient agreed to proceed.  She signed a written informed consent, and was taken to the procedure room and positioned supine.  I prepped her left groin region widely with chloraprep and allowed sufficient time for the chloraprep to dry.  I draped her left groin region in the usual sterile fashion.  I wore a mask and sterile gloves for the procedure.  Using fluoroscopic guidance, I identified the junction of the left femoral head and neck.  I  anesthetized the skin and underlying tissues above with 2 cc's 0.25% bupivacaine.  I then advanced under intermittent fluoroscopy, a 23g 3.5 inch quincke needle to the junction of the left femoral head and neck and into the left hip joint.   No contrast was used due to [employer].   After negative aspiration, I then injected a mixture of 5cc 0.25% bupivacaine and 20mg  triamcinolone.  I flushed the needle before removal and had a sterile bandage applied over the needle insertion site.    Fluoroscopy Data:   Refer to imaging section in Epic    Medications used for the procedure:  Lot numbers and amount used saved in Epic    Plan:     Home Exercise Program  Activity Modifications  Pain Medications as needed.  Prescription for oxycodone 5mg  #20 electronically prescribed  Follow up in one month  MRI left hip if no improvement      Glendell Docker, MD  [facility] Program Director for Pain Medicine  Assistant Professor, [employer] [facility]  Team Physician, Pain Medicine, [facility] [place] [facility] Football Team  [facility]  [address]  [place], [place] [address]  T [phone]          This note was generated in part by the Epic EMR system/ Dragon speech recognition and may contain errors or omissions not intended by the user. Grammatical errors, random word insertions, deletions, pronoun errors and incomplete sentences are occasional consequences of this technology due to software limitations. Not all errors are caught or corrected. If there are questions or concerns about the content of this note or information contained within the body of this dictation they should be addressed directly with the author for clarification

## 2021-02-20 ENCOUNTER — Encounter: Payer: Self-pay | Admitting: Surgery

## 2021-02-27 ENCOUNTER — Ambulatory Visit: Payer: HMO | Admitting: Surgery

## 2021-03-13 ENCOUNTER — Ambulatory Visit: Payer: HMO | Admitting: Surgery

## 2021-07-15 DIAGNOSIS — H33103 Unspecified retinoschisis, bilateral: Secondary | ICD-10-CM | POA: Diagnosis not present

## 2021-07-15 DIAGNOSIS — H35453 Secondary pigmentary degeneration, bilateral: Secondary | ICD-10-CM | POA: Diagnosis not present

## 2021-07-15 DIAGNOSIS — H35363 Drusen (degenerative) of macula, bilateral: Secondary | ICD-10-CM | POA: Diagnosis not present

## 2021-07-15 DIAGNOSIS — H31091 Other chorioretinal scars, right eye: Secondary | ICD-10-CM | POA: Diagnosis not present

## 2021-10-10 DIAGNOSIS — I10 Essential (primary) hypertension: Secondary | ICD-10-CM | POA: Diagnosis not present

## 2021-10-13 DIAGNOSIS — E782 Mixed hyperlipidemia: Secondary | ICD-10-CM | POA: Diagnosis not present

## 2021-10-13 DIAGNOSIS — I10 Essential (primary) hypertension: Secondary | ICD-10-CM | POA: Diagnosis not present

## 2022-01-19 DIAGNOSIS — H338 Other retinal detachments: Secondary | ICD-10-CM | POA: Diagnosis not present

## 2022-01-19 DIAGNOSIS — H31093 Other chorioretinal scars, bilateral: Secondary | ICD-10-CM | POA: Diagnosis not present

## 2022-01-19 DIAGNOSIS — H35363 Drusen (degenerative) of macula, bilateral: Secondary | ICD-10-CM | POA: Diagnosis not present

## 2022-01-29 DIAGNOSIS — H33011 Retinal detachment with single break, right eye: Secondary | ICD-10-CM | POA: Diagnosis not present

## 2022-04-14 DIAGNOSIS — E782 Mixed hyperlipidemia: Secondary | ICD-10-CM | POA: Diagnosis not present

## 2022-04-14 DIAGNOSIS — I10 Essential (primary) hypertension: Secondary | ICD-10-CM | POA: Diagnosis not present

## 2022-04-14 DIAGNOSIS — Z79899 Other long term (current) drug therapy: Secondary | ICD-10-CM | POA: Diagnosis not present

## 2022-04-14 DIAGNOSIS — Z23 Encounter for immunization: Secondary | ICD-10-CM | POA: Diagnosis not present

## 2022-04-30 DIAGNOSIS — H33011 Retinal detachment with single break, right eye: Secondary | ICD-10-CM | POA: Diagnosis not present

## 2022-04-30 DIAGNOSIS — H33102 Unspecified retinoschisis, left eye: Secondary | ICD-10-CM | POA: Diagnosis not present

## 2022-04-30 DIAGNOSIS — H35363 Drusen (degenerative) of macula, bilateral: Secondary | ICD-10-CM | POA: Diagnosis not present

## 2022-04-30 DIAGNOSIS — H31091 Other chorioretinal scars, right eye: Secondary | ICD-10-CM | POA: Diagnosis not present

## 2022-11-19 ENCOUNTER — Encounter (INDEPENDENT_AMBULATORY_CARE_PROVIDER_SITE_OTHER): Payer: Self-pay | Admitting: Physician Assistant

## 2022-11-19 ENCOUNTER — Ambulatory Visit (INDEPENDENT_AMBULATORY_CARE_PROVIDER_SITE_OTHER): Payer: BLUE CROSS/BLUE SHIELD | Admitting: Physician Assistant

## 2022-11-19 ENCOUNTER — Telehealth (INDEPENDENT_AMBULATORY_CARE_PROVIDER_SITE_OTHER): Payer: Self-pay

## 2022-11-19 ENCOUNTER — Ambulatory Visit (INDEPENDENT_AMBULATORY_CARE_PROVIDER_SITE_OTHER): Payer: BLUE CROSS/BLUE SHIELD

## 2022-11-19 VITALS — BP 115/83 | HR 104

## 2022-11-19 DIAGNOSIS — M25559 Pain in unspecified hip: Secondary | ICD-10-CM

## 2022-11-19 DIAGNOSIS — M25552 Pain in left hip: Secondary | ICD-10-CM

## 2022-11-19 MED ORDER — ACETAMINOPHEN-CODEINE 300-30 MG PO TABS
1.0000 | ORAL_TABLET | Freq: Four times a day (QID) | ORAL | 0 refills | Status: DC | PRN
Start: 2022-11-19 — End: 2022-12-08

## 2022-11-19 MED ORDER — ACETAMINOPHEN-CODEINE 300-30 MG PO TABS
1.0000 | ORAL_TABLET | Freq: Four times a day (QID) | ORAL | 0 refills | Status: DC | PRN
Start: 2022-11-19 — End: 2022-11-19

## 2022-11-19 NOTE — Progress Notes (Signed)
[facility]        Patient: [name]  DOB: 06-19-1961  AGE: 62 y.o.  MR#:  [id number]      CHIEF COMPLAINT: left Hip Pain.    HISTORY OF PRESENT ILLNESS: [name] is a 62 y.o. year old female who presents with a chief complaint of left hip pain that began 2 months ago after walking into a door frame on that side. She denies numbness or radicular symptoms to the extremities. She reports pain with ambulation. She denies groin pain. She points laterally to the location of her pain. No issues on the right hip. Has taken tylenol without benefit. Limited on medications as she is on Xarelto and cannot take NSAIDs.  Has taken tylenol #3 in the past without issue or negative reaction or side effect.    Past Medical History:   Diagnosis Date    Chronic ulcerative colitis     nio issues for long time    Claustrophobia     Clotting disorder     DVT LLE 2012    Deep venous thrombosis of calf 2012    LLL/on xeralto//    GERD (gastroesophageal reflux disease)     Glaucoma NEC     bilat    Hypertensive disorder     controlled well with medication    Ovarian cyst, right     Plantar fasciitis     Seasonal allergies        Past Surgical History:   Procedure Laterality Date    BUNIONECTOMY      left foot    CATARACT EXTRACTION      EYE SURGERY Bilateral 2013    laser surgery for glaucoma, bilateral trabeculectomy    GLAUCOMA SURGERY      HYSTERECTOMY      removed both ovaries and tubes    revision of hysterectomyl suture line      REVISION, BLEB, SCLERAL PATCH GRAFT Left 05/25/2019    Procedure: REVISION, BLEB;  Surgeon: Lamount Cranker, MD;  Location: [place] [facility];  Service: Ophthalmology;  Laterality: Left;    TONSILLECTOMY      TRABECULECTOMY Left 03/22/2014    Procedure: TRABECULECTOMY;  Surgeon: Pecola Leisure, MD;  Location: [place] [facility];  Service: Ophthalmology;  Laterality: Left;  TRABECULECTOMY REVISION, LEFT EYE  Q1=N  ANES=MAC  EQUIP=MITOMYCIN C, 0.4mg /cc, DISPENSE  5cc (ORDER TO FOLLOW)  MD REQ 30 MIN  ALLERGIES=COSOPT, AZOPT, ALPHAGAN  **Need RESIGHT SET from SPD    ureterectomy left         Current Outpatient Medications on File Prior to Visit   Medication Sig Dispense Refill    desloratadine (CLARINEX) 5 MG tablet Take 1 tablet (5 mg) by mouth daily      dexlansoprazole (Dexilant) 60 MG capsule Take 1 capsule (60 mg) by mouth daily      ergocalciferol (ERGOCALCIFEROL) 1.25 MG (50000 UT) capsule 1 capsule (50,000 Units)      hydrocortisone 2.5 % cream APPLY TO AFFECTED AREA TWICE DAILY      metoprolol (LOPRESSOR) 100 MG tablet Take 1 tablet (100 mg) by mouth daily      rivaroxaban (XARELTO) 10 MG Tab Take 1 tablet (10 mg) by mouth daily      timolol (TIMOPTIC) 0.5 % ophthalmic solution Place 1 drop into the left eye daily       No current facility-administered medications on file prior to visit.  Allergies   Allergen Reactions    Latex Itching       PHYSICAL EXAM    Vitals:    11/19/22 1126   BP: 115/83   Pulse: (!) 104   SpO2: 98%       General: The patient is a well appearing female who appears the stated age and is in no acute distress. She presents with her husband.    Left Hip Exam:    Antalgic gait    Skin intact  Erythema: None  Swelling: None  Effusion: None  Deformities: None  Leg Length Discrepancy: Negative  ROM: FF: 120  ABD: 60 ER: 60  IR: 60  IP TTP: Negative  Greater Trochanter TTP: Positive  Other TTP: None    Log Roll: Negative  Stinchfield (Resisted SLR): Negative  FADIR (impingement Test): Negative  FABER Luisa Hart): Positive  EXT/ER Posterior Impingement: Negative    Ober Test: Negative    Straight Leg Raise: Negative  Crossed Straight Leg Raise: Negative    Strength: WNL    Other areas of Tenderness: None  Sensation Intact to light touch all distributions of leg and foot  DF, PF, EHL motor intact  Foot Perfused with CR <2 sec  DP/PT pulse 2+    Right Hip Exam:    Skin intact  Erythema: None  Swelling: None  Effusion: None  Deformities: None  Leg  Length Discrepancy: Negative  ROM: Full    Log Roll: Negative  Straight Leg Raise: Negative  Crossed Straight Leg Raise: Negative    Strength: WNL  Sensation Intact to light touch all distributions of leg and foot  DF, PF, EHL motor intact  Foot Perfused with CR <2 sec  DP/PT pulse 2+    IMAGING STUDIES:  Xrays of the left hip obtained today was reviewed by me personally and by attending surgeon, Dr. Cherly Hensen and demonstrates No obvious fracture, degenerative changes or other osseous abnormalities.    IMPRESSION:      Diagnosis ICD-10-CM Associated Order   1. Left hip pain  M25.552 XR Hip left 2-3 vw with pelvis     Referral to Orthopaedic Sports Med ([place])     Ambulatory referral to Physical Therapy      2. Greater trochanteric pain syndrome  M25.559 Referral to Orthopaedic Sports Med ([place])     Ambulatory referral to Physical Therapy          PLAN:  Case reviewed and discussed with attending surgeon, Dr. Cherly Hensen. Patient with historical and clinical evidence of left greater trochanteric pain syndrome.  The patient's diagnosis and treatment options were discussed in detail at today's visit.  I have asked the patient to avoid high impact activities. We reviewed the standard conservative and surgical management as well as the use of medications; patient unable to take NSAIDs given anticoagulation. A short supply of tylenol #3 sent to patient's desired pharmacy; PMP pulled, has not had narcotic prescriptions since 2022. She is to transition to OTC tylenol. I have taken this opportunity to refer patient to a formal course of physical therapy.   We discussed the limited role of injection therapy in management and its potential role in diagnosis and short term pain relief.  Patient will schedule for a follow up and possible ultrasound guided left greater trochanteric steroid injection at the next available slot. She was also recommended to obtain a cane for ambulation assistance. All of the patient's questions and concerns  were asked and answered. The patient will contact the  clinic with any questions, concerns, updates or changes and verbalized understanding and agreement to the treatment plan.     Ayelet Gruenewald [facility], PA-C      ORDERS PLACED    Orders Placed This Encounter    XR Hip left 2-3 vw with pelvis    Referral to Orthopaedic Sports Med ([place])    Ambulatory referral to Physical Therapy    DISCONTD: acetaminophen-codeine (TYLENOL #3) 300-30 MG per tablet    acetaminophen-codeine (TYLENOL #3) 300-30 MG per tablet     The review of the patient's medications does not in any way constitute an endorsement, by this clinician, of their use, dosage, indications, route, efficacy, interactions, or other clinical parameters.     This note was generated within the [facility] EMR and may contain inherent errors or omissions not intended by the user. Grammatical and punctuation errors, random word insertions, deletions, pronoun errors and incomplete sentences are occasional consequences of this technology due to software limitations. Not all errors are caught or corrected.  Although every attempt is made to root out erroneus and incomplete transcription, the note may still not fully represent the intent or opinion of the author. If there are questions or concerns about the content of this note or information contained within the body of this dictation they should be addressed directly with the author for clarification.

## 2022-11-19 NOTE — Telephone Encounter (Signed)
Confirmed with pharmacy that Tylenol 3 is in stock.  Spoke with patient and confirmed she may pick this up this evening.

## 2022-11-20 ENCOUNTER — Other Ambulatory Visit (INDEPENDENT_AMBULATORY_CARE_PROVIDER_SITE_OTHER): Payer: Self-pay | Admitting: Orthopaedic Surgery

## 2022-11-20 ENCOUNTER — Telehealth: Payer: Self-pay | Admitting: Physician Assistant

## 2022-11-20 ENCOUNTER — Telehealth (INDEPENDENT_AMBULATORY_CARE_PROVIDER_SITE_OTHER): Payer: Self-pay

## 2022-11-20 MED ORDER — ACETAMINOPHEN-CODEINE 300-30 MG PO TABS
1.0000 | ORAL_TABLET | Freq: Four times a day (QID) | ORAL | 0 refills | Status: AC | PRN
Start: 2022-11-20 — End: 2022-12-02

## 2022-11-20 NOTE — Telephone Encounter (Signed)
[facility] - TRIAGE NURSE INQUIRY    Contact source (ins/patient/Home Health): Patient    Phone#:  [phone]    Spoke to:  [place]    Additional notes:  Patient states husband went to pick up Tylenol 3 script today at [facility] on [address] and was told that medication was out of stock.  Patient would like to try and send script to:    [id number]  [address] [place], [place] [address]  [phone]    Patient also requested nurse to call, she doesn't understand what happened since nurse confirmed with [facility] that pharmacy was in stock.    NEXT ACTION NEEDED:  Inbasket message sent Eileen/managers.

## 2022-11-20 NOTE — Telephone Encounter (Signed)
Per Patient, send Tylenol 3 to [facility] on [address] in [place].  [facility] and they have not [facility] since Nov, they have no plans to get this in.  [facility] the [facility].  Message to Melody and Dr. Cherly Hensen for assistance.

## 2022-11-24 ENCOUNTER — Ambulatory Visit (INDEPENDENT_AMBULATORY_CARE_PROVIDER_SITE_OTHER): Payer: BLUE CROSS/BLUE SHIELD | Admitting: Family Medicine

## 2022-12-08 ENCOUNTER — Encounter (INDEPENDENT_AMBULATORY_CARE_PROVIDER_SITE_OTHER): Payer: Self-pay | Admitting: Family Medicine

## 2022-12-08 ENCOUNTER — Ambulatory Visit (INDEPENDENT_AMBULATORY_CARE_PROVIDER_SITE_OTHER): Payer: BLUE CROSS/BLUE SHIELD | Admitting: Family Medicine

## 2022-12-08 ENCOUNTER — Ambulatory Visit (INDEPENDENT_AMBULATORY_CARE_PROVIDER_SITE_OTHER): Payer: BLUE CROSS/BLUE SHIELD

## 2022-12-08 VITALS — BP 110/82 | HR 99

## 2022-12-08 DIAGNOSIS — M25552 Pain in left hip: Secondary | ICD-10-CM

## 2022-12-08 DIAGNOSIS — M25559 Pain in unspecified hip: Secondary | ICD-10-CM

## 2022-12-08 DIAGNOSIS — M533 Sacrococcygeal disorders, not elsewhere classified: Secondary | ICD-10-CM

## 2022-12-08 DIAGNOSIS — M25551 Pain in right hip: Secondary | ICD-10-CM

## 2022-12-08 MED ORDER — ACETAMINOPHEN-CODEINE 300-30 MG PO TABS
1.0000 | ORAL_TABLET | Freq: Four times a day (QID) | ORAL | 0 refills | Status: DC | PRN
Start: 2022-12-08 — End: 2023-01-22

## 2022-12-08 MED ORDER — PREDNISONE 20 MG PO TABS
20.0000 mg | ORAL_TABLET | Freq: Two times a day (BID) | ORAL | 0 refills | Status: AC
Start: 2022-12-08 — End: 2022-12-13

## 2022-12-08 NOTE — Progress Notes (Signed)
[facility]                  Date of Exam: 12/08/2022 1:49 PM      Patient ID: [name] is a 62 y.o. female.  Attending Physician: Hanley Ben, DO      Chief Complaint:   Chief Complaint   Patient presents with    Hip Pain     F/u: L hip pain, hip CSI             HPI:   HPI  Comes in today for evaluation of b/ll hip pain.  Seen by Wynelle Beckmann and referred for potential cortisone injections for GTPS.  The patient reports no significant changes in her pain since being seen by Melody but reports her pain as being primarily over the posterior hips over the SI joints and the pain radiates laterally from there.       ROS:   As per HPI.  Otherwise as below.  Review of Systems   Constitutional:  Negative for activity change and chills.   HENT:  Negative for congestion.    Respiratory:  Negative for chest tightness and shortness of breath.    Cardiovascular:  Negative for chest pain and palpitations.   Gastrointestinal:  Negative for abdominal pain and constipation.   Genitourinary:  Negative for difficulty urinating and dysuria.           Problem List:   Patient Active Problem List   Diagnosis    Low tension glaucoma    Hypertensive disorder        Current Meds:   Outpatient Medications Marked as Taking for the 12/08/22 encounter (Office Visit) with Hanley Ben, DO   Medication Sig Dispense Refill    desloratadine (CLARINEX) 5 MG tablet Take 1 tablet (5 mg) by mouth daily      dexlansoprazole (Dexilant) 60 MG capsule Take 1 capsule (60 mg) by mouth daily      ergocalciferol (ERGOCALCIFEROL) 1.25 MG (50000 UT) capsule 1 capsule (50,000 Units)      hydrocortisone 2.5 % cream APPLY TO AFFECTED AREA TWICE DAILY      metoprolol (LOPRESSOR) 100 MG tablet Take 1 tablet (100 mg) by mouth daily      rivaroxaban (XARELTO) 10 MG Tab Take 1 tablet (10 mg) by mouth daily      timolol (TIMOPTIC) 0.5 % ophthalmic solution Place 1 drop into the left eye daily      [DISCONTINUED]  acetaminophen-codeine (TYLENOL #3) 300-30 MG per tablet Take 1 tablet by mouth every 6 (six) hours as needed for Pain 12 tablet 0         Allergies:   Allergies   Allergen Reactions    Latex Itching        Past Surgical History:   Past Surgical History:   Procedure Laterality Date    BUNIONECTOMY      left foot    CATARACT EXTRACTION      EYE SURGERY Bilateral 2013    laser surgery for glaucoma, bilateral trabeculectomy    GLAUCOMA SURGERY      HYSTERECTOMY      removed both ovaries and tubes    revision of hysterectomyl suture line      REVISION, BLEB, SCLERAL PATCH GRAFT Left 05/25/2019    Procedure: REVISION, BLEB;  Surgeon: Lamount Cranker, MD;  Location: [place];  Service: Ophthalmology;  Laterality: Left;    TONSILLECTOMY  TRABECULECTOMY Left 03/22/2014    Procedure: TRABECULECTOMY;  Surgeon: Pecola Leisure, MD;  Location: [facility];  Service: Ophthalmology;  Laterality: Left;  TRABECULECTOMY REVISION, LEFT EYE  Q1=N  ANES=MAC  EQUIP=MITOMYCIN C, 0.4mg /cc, DISPENSE 5cc (ORDER TO FOLLOW)  MD REQ 30 MIN  ALLERGIES=COSOPT, AZOPT, ALPHAGAN  **Need RESIGHT SET from SPD    ureterectomy left            Family History:   Family History   Problem Relation Age of Onset    Hypertension Father     Vision loss Father     Vision loss Brother     Hypertension Paternal Aunt     Vision loss Paternal Uncle           Social History:   Social History     Tobacco Use    Smoking status: Former     Current packs/day: 0.00     Types: Cigarettes     Quit date: 12/21/2010     Years since quitting: 11.9    Smokeless tobacco: Never   Vaping Use    Vaping status: Never Used   Substance Use Topics    Alcohol use: Yes     Alcohol/week: 1.0 standard drink of alcohol     Types: 1 Cans of beer per week     Comment: 1/week    Drug use: No          The following sections were reviewed this encounter by the provider:             Vital Signs:   BP 110/82   Pulse 99   SpO2 100%           Physical Exam:   Physical Exam  Vitals  and nursing note reviewed.   Constitutional:       General: She is not in acute distress.     Appearance: Normal appearance. She is not ill-appearing.   HENT:      Head: Normocephalic and atraumatic.      Right Ear: External ear normal.      Left Ear: External ear normal.      Nose: Nose normal. No congestion.      Mouth/Throat:      Mouth: Mucous membranes are moist.   Eyes:      Pupils: Pupils are equal, round, and reactive to light.   Musculoskeletal:      Comments: Examination reveals negative log roll and stinchfield.  Negative b/l FADIR.  Positive FABER and SI drawer b/l.  B/l SI tenderness   Neurological:      Mental Status: She is alert.   Psychiatric:         Mood and Affect: Mood normal.               Procedure(s):   Procedures        Radiology:   X-rays obtained today and reviewed.  Additional views of the R hip compared to L hip views dated 11/19/22.  Mild to moderate b/l hip DJD.  Moderate b/l SI DJD         Assessment:   1. Left hip pain  - Referral to Orthopaedic Sports Med ([place])  - acetaminophen-codeine (TYLENOL #3) 300-30 MG per tablet; Take 1 tablet by mouth every 6 (six) hours as needed for Pain  Dispense: 30 tablet; Refill: 0  - Interventional Pain Management Referral: Cathie Beams, MD ([facility]); Future  - predniSONE (DELTASONE) 20  MG tablet; Take 1 tablet (20 mg) by mouth 2 (two) times daily for 5 days  Dispense: 10 tablet; Refill: 0    2. Greater trochanteric pain syndrome  - Referral to Orthopaedic Sports Med ([place])  - acetaminophen-codeine (TYLENOL #3) 300-30 MG per tablet; Take 1 tablet by mouth every 6 (six) hours as needed for Pain  Dispense: 30 tablet; Refill: 0  - Interventional Pain Management Referral: Cathie Beams, MD ([facility] [place]); Future  - predniSONE (DELTASONE) 20 MG tablet; Take 1 tablet (20 mg) by mouth 2 (two) times daily for 5 days  Dispense: 10 tablet; Refill: 0    3. Right hip pain  - XR Hip right 2-3 vw with  pelvis  - acetaminophen-codeine (TYLENOL #3) 300-30 MG per tablet; Take 1 tablet by mouth every 6 (six) hours as needed for Pain  Dispense: 30 tablet; Refill: 0  - Interventional Pain Management Referral: Cathie Beams, MD ([facility] [place] [facility] [place]); Future  - predniSONE (DELTASONE) 20 MG tablet; Take 1 tablet (20 mg) by mouth 2 (two) times daily for 5 days  Dispense: 10 tablet; Refill: 0    4. SI (sacroiliac) pain  - acetaminophen-codeine (TYLENOL #3) 300-30 MG per tablet; Take 1 tablet by mouth every 6 (six) hours as needed for Pain  Dispense: 30 tablet; Refill: 0  - Interventional Pain Management Referral: Cathie Beams, MD ([facility]); Future  - predniSONE (DELTASONE) 20 MG tablet; Take 1 tablet (20 mg) by mouth 2 (two) times daily for 5 days  Dispense: 10 tablet; Refill: 0           Plan:   1. Right hip pain    2. Left hip pain    3. Greater trochanteric pain syndrome    4. SI (sacroiliac) pain       Based on physical exam today, her pain appears to be primarily from her SI joints.  Referral to pain management for further evaluation and management and for consideration of SI cortisone injections under CT/fluro guidance.  Tylenol #3 refilled and 5 days course of prednisone given       Follow-up:   No follow-ups on file.          Lonell Face II, DO

## 2023-01-01 ENCOUNTER — Encounter (INDEPENDENT_AMBULATORY_CARE_PROVIDER_SITE_OTHER): Payer: Self-pay | Admitting: Family Medicine

## 2023-01-13 ENCOUNTER — Emergency Department: Payer: BLUE CROSS/BLUE SHIELD

## 2023-01-13 ENCOUNTER — Inpatient Hospital Stay
Admission: EM | Admit: 2023-01-13 | Discharge: 2023-01-22 | DRG: 299 | Disposition: A | Discharge status: Skilled Nursing Facility | Payer: BLUE CROSS/BLUE SHIELD | Attending: Internal Medicine | Admitting: Internal Medicine

## 2023-01-13 DIAGNOSIS — M48061 Spinal stenosis, lumbar region without neurogenic claudication: Secondary | ICD-10-CM | POA: Diagnosis present

## 2023-01-13 DIAGNOSIS — Z8719 Personal history of other diseases of the digestive system: Secondary | ICD-10-CM

## 2023-01-13 DIAGNOSIS — K219 Gastro-esophageal reflux disease without esophagitis: Secondary | ICD-10-CM | POA: Diagnosis present

## 2023-01-13 DIAGNOSIS — E43 Unspecified severe protein-calorie malnutrition: Secondary | ICD-10-CM | POA: Diagnosis present

## 2023-01-13 DIAGNOSIS — I82412 Acute embolism and thrombosis of left femoral vein: Secondary | ICD-10-CM | POA: Diagnosis present

## 2023-01-13 DIAGNOSIS — Z8673 Personal history of transient ischemic attack (TIA), and cerebral infarction without residual deficits: Secondary | ICD-10-CM

## 2023-01-13 DIAGNOSIS — Z86718 Personal history of other venous thrombosis and embolism: Secondary | ICD-10-CM

## 2023-01-13 DIAGNOSIS — Z87891 Personal history of nicotine dependence: Secondary | ICD-10-CM

## 2023-01-13 DIAGNOSIS — I82433 Acute embolism and thrombosis of popliteal vein, bilateral: Principal | ICD-10-CM | POA: Diagnosis present

## 2023-01-13 DIAGNOSIS — W1830XA Fall on same level, unspecified, initial encounter: Secondary | ICD-10-CM | POA: Diagnosis present

## 2023-01-13 DIAGNOSIS — M4856XA Collapsed vertebra, not elsewhere classified, lumbar region, initial encounter for fracture: Secondary | ICD-10-CM | POA: Diagnosis present

## 2023-01-13 DIAGNOSIS — R32 Unspecified urinary incontinence: Secondary | ICD-10-CM | POA: Diagnosis present

## 2023-01-13 DIAGNOSIS — D649 Anemia, unspecified: Secondary | ICD-10-CM | POA: Diagnosis present

## 2023-01-13 DIAGNOSIS — S22071A Stable burst fracture of T9-T10 vertebra, initial encounter for closed fracture: Secondary | ICD-10-CM | POA: Diagnosis present

## 2023-01-13 DIAGNOSIS — L89153 Pressure ulcer of sacral region, stage 3: Secondary | ICD-10-CM | POA: Diagnosis present

## 2023-01-13 DIAGNOSIS — Z79899 Other long term (current) drug therapy: Secondary | ICD-10-CM

## 2023-01-13 DIAGNOSIS — I2693 Single subsegmental pulmonary embolism without acute cor pulmonale: Secondary | ICD-10-CM | POA: Diagnosis present

## 2023-01-13 DIAGNOSIS — I8290 Acute embolism and thrombosis of unspecified vein: Secondary | ICD-10-CM | POA: Insufficient documentation

## 2023-01-13 DIAGNOSIS — Z90710 Acquired absence of both cervix and uterus: Secondary | ICD-10-CM

## 2023-01-13 DIAGNOSIS — Z91040 Latex allergy status: Secondary | ICD-10-CM

## 2023-01-13 DIAGNOSIS — F40240 Claustrophobia: Secondary | ICD-10-CM | POA: Diagnosis present

## 2023-01-13 DIAGNOSIS — L89896 Pressure-induced deep tissue damage of other site: Secondary | ICD-10-CM | POA: Diagnosis present

## 2023-01-13 DIAGNOSIS — I82462 Acute embolism and thrombosis of left calf muscular vein: Secondary | ICD-10-CM | POA: Diagnosis present

## 2023-01-13 DIAGNOSIS — I2699 Other pulmonary embolism without acute cor pulmonale: Secondary | ICD-10-CM | POA: Diagnosis present

## 2023-01-13 DIAGNOSIS — I10 Essential (primary) hypertension: Secondary | ICD-10-CM | POA: Diagnosis present

## 2023-01-13 DIAGNOSIS — I82812 Embolism and thrombosis of superficial veins of left lower extremities: Secondary | ICD-10-CM | POA: Diagnosis present

## 2023-01-13 DIAGNOSIS — M4854XA Collapsed vertebra, not elsewhere classified, thoracic region, initial encounter for fracture: Secondary | ICD-10-CM | POA: Diagnosis present

## 2023-01-13 DIAGNOSIS — Z7901 Long term (current) use of anticoagulants: Secondary | ICD-10-CM

## 2023-01-13 DIAGNOSIS — T45516A Underdosing of anticoagulants, initial encounter: Secondary | ICD-10-CM | POA: Diagnosis present

## 2023-01-13 DIAGNOSIS — M4807 Spinal stenosis, lumbosacral region: Principal | ICD-10-CM | POA: Diagnosis present

## 2023-01-13 DIAGNOSIS — Y92099 Unspecified place in other non-institutional residence as the place of occurrence of the external cause: Secondary | ICD-10-CM

## 2023-01-13 DIAGNOSIS — Z6821 Body mass index (BMI) 21.0-21.9, adult: Secondary | ICD-10-CM

## 2023-01-13 DIAGNOSIS — Z91138 Patient's unintentional underdosing of medication regimen for other reason: Secondary | ICD-10-CM

## 2023-01-13 LAB — COMPREHENSIVE METABOLIC PANEL
ALT: 30 U/L (ref 0–55)
AST (SGOT): 41 U/L (ref 5–41)
Albumin/Globulin Ratio: 0.6 — ABNORMAL LOW (ref 0.9–2.2)
Albumin: 2.2 g/dL — ABNORMAL LOW (ref 3.5–5.0)
Alkaline Phosphatase: 214 U/L — ABNORMAL HIGH (ref 37–117)
Anion Gap: 9 (ref 5.0–15.0)
BUN: 11 mg/dL (ref 7–21)
Bilirubin, Total: 0.8 mg/dL (ref 0.2–1.2)
CO2: 25 mEq/L (ref 17–29)
Calcium: 8.8 mg/dL (ref 8.5–10.5)
Chloride: 106 mEq/L (ref 99–111)
Creatinine: 0.7 mg/dL (ref 0.4–1.0)
GFR: >60 mL/min/{1.73_m2} (ref >=60.0)
Globulin: 3.8 g/dL — ABNORMAL HIGH (ref 2.0–3.6)
Glucose: 80 mg/dL (ref 70–100)
Potassium: 4.4 mEq/L (ref 3.5–5.3)
Protein, Total: 6 g/dL (ref 6.0–8.3)
Sodium: 140 mEq/L (ref 135–145)

## 2023-01-13 LAB — LAB USE ONLY - CBC WITH DIFFERENTIAL
Absolute Basophils: 0.02 10*3/uL (ref 0.00–0.08)
Absolute Eosinophils: 0.16 10*3/uL (ref 0.00–0.44)
Absolute Immature Granulocytes: 0.03 10*3/uL (ref 0.00–0.07)
Absolute Lymphocytes: 1.87 10*3/uL (ref 0.42–3.22)
Absolute Monocytes: 0.71 10*3/uL (ref 0.21–0.85)
Absolute Neutrophils: 7.24 10*3/uL — ABNORMAL HIGH (ref 1.10–6.33)
Absolute nRBC: 0 10*3/uL (ref <=0.00)
Basophils %: 0.2 %
Eosinophils %: 1.6 %
Hematocrit: 35.3 % (ref 34.7–43.7)
Hemoglobin: 11.9 g/dL (ref 11.4–14.8)
Immature Granulocytes %: 0.3 %
Lymphocytes %: 18.6 %
MCH: 34.8 pg — ABNORMAL HIGH (ref 25.1–33.5)
MCHC: 33.7 g/dL (ref 31.5–35.8)
MCV: 103.2 fL — ABNORMAL HIGH (ref 78.0–96.0)
MPV: 9.6 fL (ref 8.9–12.5)
Monocytes %: 7.1 %
Neutrophils %: 72.2 %
Platelet Count: 243 10*3/uL (ref 142–346)
Preliminary Absolute Neutrophil Count: 7.24 10*3/uL — ABNORMAL HIGH (ref 1.10–6.33)
RBC: 3.42 10*6/uL — ABNORMAL LOW (ref 3.90–5.10)
RDW: 16 % — ABNORMAL HIGH (ref 11–15)
WBC: 10.03 10*3/uL — ABNORMAL HIGH (ref 3.10–9.50)
nRBC %: 0 /100 WBC (ref <=0.0)

## 2023-01-13 LAB — MAGNESIUM: Magnesium: 1.9 mg/dL (ref 1.6–2.6)

## 2023-01-13 LAB — CREATINE KINASE (CK): Creatine Kinase (CK): 19 U/L — ABNORMAL LOW (ref 29–233)

## 2023-01-13 LAB — C-REACTIVE PROTEIN: C-Reactive Protein: 2.1 mg/dL — ABNORMAL HIGH (ref 0.0–1.1)

## 2023-01-13 LAB — SEDIMENTATION RATE: Sed Rate: 25 mm/Hr — ABNORMAL HIGH (ref <=20)

## 2023-01-13 MED ORDER — MORPHINE SULFATE 4 MG/ML IJ/IV SOLN (WRAP)
4.0000 mg | Freq: Once | Status: AC
Start: 2023-01-13 — End: 2023-01-13
  Administered 2023-01-13: 4 mg via INTRAVENOUS
  Filled 2023-01-13: qty 1

## 2023-01-13 MED ORDER — LOPERAMIDE HCL 2 MG PO CAPS
2.0000 mg | ORAL_CAPSULE | Freq: Four times a day (QID) | ORAL | Status: DC | PRN
Start: 2023-01-13 — End: 2023-01-22

## 2023-01-13 NOTE — ED Triage Notes (Signed)
This is a 62 y/o F presenting to the ED for L sided L pain starting this AM. Endorsing slight numbness. Pt hx of DVT. +thinners. Denies CP/SOB. Pt ambulatory at home. Bilateral lower leg swelling baseline per pt. A&Ox4, breathing even and unlabored, skin warm and dry.

## 2023-01-13 NOTE — ED Provider Notes (Signed)
CC: Leg Pain  .         HPI:   [name] is a 62 y.o. female who was BIB EMS for bilateral lower extremity weakness worsening today. Pt reports she has had this weakness for around 3 days, worsening today prompting visit to ED. Her weakness feels equal to both sides, and notes it feels the worst in her upper legs. Pt notes she has had few episodes of urinary incontinence d/t inability to make it to bathroom in time with her weakness.     Denies hx of DM, neck pain, recent falls. Pt is taking blood thinners.     Home Medications       Med List Status: Complete Set By: Billey Chang, RN at 01/13/2023 11:24 PM              acetaminophen-codeine (TYLENOL #3) 300-30 MG per tablet     Take 1 tablet by mouth every 6 (six) hours as needed for Pain     desloratadine (CLARINEX) 5 MG tablet     Take 1 tablet (5 mg) by mouth daily     dexlansoprazole (Dexilant) 60 MG capsule     Take 1 capsule (60 mg) by mouth daily     ergocalciferol (ERGOCALCIFEROL) 1.25 MG (50000 UT) capsule     1 capsule (50,000 Units)     hydrocortisone 2.5 % cream     APPLY TO AFFECTED AREA TWICE DAILY     metoprolol (LOPRESSOR) 100 MG tablet     Take 1 tablet (100 mg) by mouth daily     rivaroxaban (XARELTO) 10 MG Tab     Take 1 tablet (10 mg) by mouth daily     timolol (TIMOPTIC) 0.5 % ophthalmic solution     Place 1 drop into the left eye daily           She is allergic to latex.    Past Medical History: GERD, DVT of calf, ovarian cyst, ulcerative colitis, HTN, hysterectomy    Social History:  She reports that she quit smoking about 12 years ago. Her smoking use included cigarettes. She has never used smokeless tobacco. She reports current alcohol use of about 1.0 standard drink of alcohol per week. She reports that she does not use drugs.    Physical Exam:     Pulse (!) 105  BP (!) 170/116  Resp 18  SpO2 99 %  Temp 98.1 F (36.7 C)    Gen: No acute distress, awake/alert  HEENT: Atraumatic, normocephalic, conjunctiva anicteric, EOMI. Pale  conjunctiva  Neck: Supple, no obvious masses  CV: Regular rate, rhythm  Resp: Nonlabored, equal chest rise  Abd: Nondistended, nontender  Extremities: wwp, no swelling  Skin: No lesions, no rashes  Neuro: Awake, alert, 4/5 knee extension b/l, 3/5 big toe extension b/l, down going toes b/l     ED Medications Given:     Medications   morphine injection 4 mg (has no administration in time range)   loperamide (IMODIUM) capsule 2 mg (has no administration in time range)           MDM:  Medical Decision Making  Amount and/or Complexity of Data Reviewed  External Data Reviewed: labs and notes.  Labs: ordered. Decision-making details documented in ED Course.  Radiology: ordered. Decision-making details documented in ED Course.  Discussion of management or test interpretation with external provider(s): Neurosurgery    Risk  Prescription drug management.        61y  woman p/w b/l upper leg pain and weakness. C/f cauda equina vs. Epidrual abscess vs. Progressive spinal stenosis vs. Compression fx vs. Rhabdo/myopathy.     CPK nml  Labs sent, mild leukocytosis  CT obtained w/ subacute T12 fx and spinal stenosis. Neurosurgery consulted given weakness. Recommended MRI. Will get tonight and admit to neuro hospitalist.         Diagnosis:   Final diagnoses:   Spinal stenosis of lumbosacral region       Disposition: Admitted obs      Burt Knack MD                 Dellis Anes, MD  01/13/23 2355

## 2023-01-13 NOTE — ED to IP RN Note (Signed)
[facility] HOSPITAL EMERGENCY DEPT  ED NURSING NOTE FOR THE RECEIVING INPATIENT NURSE   ED [place]   [place] [address]   ED CHARGE RN Isaiah Serge   ADMISSION INFORMATION   [name] is a 62 y.o. female admitted with an ED diagnosis of:    1. Spinal stenosis of lumbosacral region         Isolation: None   Allergies: Latex   Holding Orders confirmed? N/A   Belongings Documented? N/A   Home medications sent to pharmacy confirmed? N/A   NURSING CARE   Patient Comes From:   Mental Status: Home Independent  alert and oriented   ADL: Independent with all ADLs   Ambulation: mild difficulty   Pertinent Information  and Safety Concerns:     Broset Violence Risk Level: Low   Pt BIBA from home for bilateral upper leg pain that began one week ago and has been getting worse. Pt was not able to get out of bed this morning and has not been able to eat due to weakness with walking. Pt had a DVT in her leg 6 years ago and is currently taking xarelto. Pt denies any chest pain or shortness of breath. Pt has a hx of high BP and ran out of her metoprolol two days ago. Denies any fall or injury. EMS dexi was 98.        CT / NIH   CT Head ordered on this patient?  N/A   NIH/Dysphagia assessment done prior to admission? N/A   VITAL SIGNS (at the time of this note)      Vitals:    01/13/23 1909   BP: (!) 170/116   Pulse: (!) 105   Resp: 18   Temp: 98.1 F (36.7 C)   SpO2: 99%

## 2023-01-14 ENCOUNTER — Observation Stay: Payer: BLUE CROSS/BLUE SHIELD

## 2023-01-14 LAB — PT AND APTT
INR: 1 (ref 0.9–1.1)
PT: 10.8 s (ref 10.1–12.9)
PTT: 25 s — ABNORMAL LOW (ref 27–39)

## 2023-01-14 LAB — CBC
Absolute nRBC: 0 10*3/uL (ref <=0.00)
Hematocrit: 32.6 % — ABNORMAL LOW (ref 34.7–43.7)
Hemoglobin: 11.1 g/dL — ABNORMAL LOW (ref 11.4–14.8)
MCH: 35.5 pg — ABNORMAL HIGH (ref 25.1–33.5)
MCHC: 34 g/dL (ref 31.5–35.8)
MCV: 104.2 fL — ABNORMAL HIGH (ref 78.0–96.0)
MPV: 9.8 fL (ref 8.9–12.5)
Platelet Count: 250 10*3/uL (ref 142–346)
RBC: 3.13 10*6/uL — ABNORMAL LOW (ref 3.90–5.10)
RDW: 16 % — ABNORMAL HIGH (ref 11–15)
WBC: 8.3 10*3/uL (ref 3.10–9.50)
nRBC %: 0 /100 WBC (ref <=0.0)

## 2023-01-14 LAB — BASIC METABOLIC PANEL
Anion Gap: 9 (ref 5.0–15.0)
BUN: 10 mg/dL (ref 7–21)
CO2: 23 mEq/L (ref 17–29)
Calcium: 8.4 mg/dL — ABNORMAL LOW (ref 8.5–10.5)
Chloride: 106 mEq/L (ref 99–111)
Creatinine: 0.6 mg/dL (ref 0.4–1.0)
GFR: >60 mL/min/{1.73_m2} (ref >=60.0)
Glucose: 79 mg/dL (ref 70–100)
Potassium: 4.3 mEq/L (ref 3.5–5.3)
Sodium: 138 mEq/L (ref 135–145)

## 2023-01-14 LAB — VITAMIN B12: Vitamin B-12: 493 pg/mL (ref 211–911)

## 2023-01-14 MED ORDER — NALOXONE HCL 0.4 MG/ML IJ SOLN (WRAP)
0.2000 mg | INTRAMUSCULAR | Status: DC | PRN
Start: 2023-01-14 — End: 2023-01-22

## 2023-01-14 MED ORDER — ACETAMINOPHEN 325 MG PO TABS
650.0000 mg | ORAL_TABLET | ORAL | Status: DC | PRN
Start: 2023-01-14 — End: 2023-01-22
  Administered 2023-01-15: 650 mg via ORAL
  Filled 2023-01-14: qty 2

## 2023-01-14 MED ORDER — HYDRALAZINE HCL 20 MG/ML IJ SOLN
10.0000 mg | INTRAMUSCULAR | Status: DC | PRN
Start: 2023-01-14 — End: 2023-01-22

## 2023-01-14 MED ORDER — CALCIUM CARBONATE ANTACID 500 MG PO CHEW
1000.0000 mg | CHEWABLE_TABLET | Freq: Four times a day (QID) | ORAL | Status: DC | PRN
Start: 2023-01-14 — End: 2023-01-22

## 2023-01-14 MED ORDER — LIDOCAINE 5 % EX PTCH
1.0000 | MEDICATED_PATCH | CUTANEOUS | Status: DC
Start: 2023-01-14 — End: 2023-01-22
  Administered 2023-01-14 – 2023-01-22 (×9): 1 via TRANSDERMAL
  Filled 2023-01-14 (×9): qty 1

## 2023-01-14 MED ORDER — DIAZEPAM 5 MG/ML IJ SOLN
2.5000 mg | INTRAMUSCULAR | Status: AC | PRN
Start: 2023-01-14 — End: 2023-01-17
  Administered 2023-01-14 – 2023-01-17 (×2): 2.5 mg via INTRAVENOUS
  Filled 2023-01-14 (×2): qty 2

## 2023-01-14 MED ORDER — CETIRIZINE HCL 10 MG PO TABS
10.0000 mg | ORAL_TABLET | Freq: Every day | ORAL | Status: DC | PRN
Start: 2023-01-14 — End: 2023-01-22

## 2023-01-14 MED ORDER — ONDANSETRON HCL 4 MG/2ML IJ SOLN
4.0000 mg | Freq: Four times a day (QID) | INTRAMUSCULAR | Status: DC | PRN
Start: 2023-01-14 — End: 2023-01-22

## 2023-01-14 MED ORDER — MELATONIN 3 MG PO TABS
6.0000 mg | ORAL_TABLET | Freq: Every evening | ORAL | Status: DC | PRN
Start: 2023-01-14 — End: 2023-01-22
  Administered 2023-01-16 – 2023-01-21 (×6): 6 mg via ORAL
  Filled 2023-01-14 (×6): qty 2

## 2023-01-14 MED ORDER — PANTOPRAZOLE SODIUM 40 MG PO TBEC
40.0000 mg | DELAYED_RELEASE_TABLET | Freq: Every day | ORAL | Status: DC
Start: 2023-01-14 — End: 2023-01-22
  Administered 2023-01-14 – 2023-01-22 (×8): 40 mg via ORAL
  Filled 2023-01-14 (×8): qty 1

## 2023-01-14 MED ORDER — GADOTERATE MEGLUMINE 7.5 MMOL/15ML IV SOLN (CLARISCAN)
0.2000 mL/kg | Freq: Once | INTRAVENOUS | Status: AC | PRN
Start: 2023-01-14 — End: 2023-01-14
  Administered 2023-01-14: 10.4 mL via INTRAVENOUS

## 2023-01-14 MED ORDER — SENNOSIDES-DOCUSATE SODIUM 8.6-50 MG PO TABS
2.0000 | ORAL_TABLET | Freq: Two times a day (BID) | ORAL | Status: DC | PRN
Start: 2023-01-14 — End: 2023-01-22

## 2023-01-14 MED ORDER — TIMOLOL MALEATE 0.5 % OP SOLN
1.0000 [drp] | Freq: Every day | OPHTHALMIC | Status: DC
Start: 2023-01-14 — End: 2023-01-22
  Administered 2023-01-14 – 2023-01-22 (×9): 1 [drp] via OPHTHALMIC
  Filled 2023-01-14 (×2): qty 5

## 2023-01-14 MED ORDER — MAGNESIUM SULFATE IN D5W 1-5 GM/100ML-% IV SOLN
1.0000 g | INTRAVENOUS | Status: DC | PRN
Start: 2023-01-14 — End: 2023-01-22

## 2023-01-14 MED ORDER — ACETAMINOPHEN 325 MG PO TABS
650.0000 mg | ORAL_TABLET | Freq: Four times a day (QID) | ORAL | Status: DC | PRN
Start: 2023-01-14 — End: 2023-01-14
  Administered 2023-01-14: 650 mg via ORAL
  Filled 2023-01-14: qty 2

## 2023-01-14 MED ORDER — POTASSIUM CHLORIDE 20 MEQ PO PACK
0.0000 meq | PACK | ORAL | Status: DC | PRN
Start: 2023-01-14 — End: 2023-01-22
  Administered 2023-01-15: 20 meq via ORAL
  Filled 2023-01-14: qty 1

## 2023-01-14 MED ORDER — MORPHINE SULFATE 2 MG/ML IJ/IV SOLN (WRAP)
2.0000 mg | Status: DC | PRN
Start: 2023-01-14 — End: 2023-01-22

## 2023-01-14 MED ORDER — ONDANSETRON 4 MG PO TBDP
4.0000 mg | ORAL_TABLET | Freq: Four times a day (QID) | ORAL | Status: DC | PRN
Start: 2023-01-14 — End: 2023-01-22

## 2023-01-14 MED ORDER — OXYCODONE HCL 5 MG PO TABS
5.0000 mg | ORAL_TABLET | Freq: Four times a day (QID) | ORAL | Status: DC | PRN
Start: 2023-01-14 — End: 2023-01-22
  Administered 2023-01-18 – 2023-01-22 (×9): 5 mg via ORAL
  Filled 2023-01-14 (×9): qty 1

## 2023-01-14 MED ORDER — METHOCARBAMOL 500 MG PO TABS
500.0000 mg | ORAL_TABLET | Freq: Four times a day (QID) | ORAL | Status: DC
Start: 2023-01-14 — End: 2023-01-22
  Administered 2023-01-14 – 2023-01-22 (×31): 500 mg via ORAL
  Filled 2023-01-14 (×32): qty 1

## 2023-01-14 MED ORDER — POTASSIUM CHLORIDE CRYS ER 20 MEQ PO TBCR
0.0000 meq | EXTENDED_RELEASE_TABLET | ORAL | Status: DC | PRN
Start: 2023-01-14 — End: 2023-01-22

## 2023-01-14 MED ORDER — METOPROLOL TARTRATE 25 MG PO TABS
50.0000 mg | ORAL_TABLET | Freq: Two times a day (BID) | ORAL | Status: DC
Start: 2023-01-14 — End: 2023-01-22
  Administered 2023-01-14 – 2023-01-22 (×16): 50 mg via ORAL
  Filled 2023-01-14: qty 2
  Filled 2023-01-14: qty 1
  Filled 2023-01-14: qty 2
  Filled 2023-01-14: qty 1
  Filled 2023-01-14 (×10): qty 2
  Filled 2023-01-14: qty 1
  Filled 2023-01-14: qty 2

## 2023-01-14 MED ORDER — ENOXAPARIN SODIUM 40 MG/0.4ML IJ SOSY
40.0000 mg | PREFILLED_SYRINGE | Freq: Every day | INTRAMUSCULAR | Status: DC
Start: 2023-01-15 — End: 2023-01-14

## 2023-01-14 NOTE — Progress Notes (Signed)
Pt delivered to room 726 via 6NT Rns at 1415. Oriented to room. CHG bath completed. Pt assessed. Skin check complete with Corrie Dandy, RN. Awaiting orders from [facility] RN who saw her on 6NT d/t injury to sacrum. Q2 hour turns. Pt alert and oriented xs 3-4 but forgetful.  1700: fitted for TLSO brace. OOB with TLSO brace. Educated pt. Pt agreeable but forgetful. Pt up to bedside commode. Feels like she has to urinate but could not. Returned to bed with ex cath, bladder scanned for .    Bed alarm on. Call light within reach.     4 eyes in 4 hours pressure injury assessment note:      Completed with: Corrie Dandy, RN   Unit & Time admitted: 1415 - 7 NT             Bony Prominences: Check appropriate box; if wound is present enter wound assessment in LDA     Occiput:                 [x] WNL  []  Wound present  Face:                     [x] WNL  []  Wound present  Ears:                      [x] WNL  []  Wound present  Spine:                    [x] WNL  []  Wound present  Shoulders:             [x] WNL  []  Wound present  Elbows:                  [x] WNL  []  Wound present  Sacrum/coccyx:     [] WNL  [x]  Wound present  Ischial Tuberosity:  [x] WNL  []  Wound present  Trochanter/Hip:      [x] WNL  []  Wound present  Knees:                   [x] WNL  []  Wound present  Ankles:                   [x] WNL  []  Wound present  Heels:                     WNL  []  Wound present  Other pressure areas:  []  Wound location       Device related: []  Device name:         LDA completed if wound present:Yes .  Consult WOCN if necessary; WOC RN saw pt on 6 NT before transfer. Picture in chart.     Other skin related issues, ie tears, rash, etc, document in Integumentary flowsheet

## 2023-01-14 NOTE — Consults (Signed)
NEUROSURGERY CONSULTATION    Date Time: 01/14/23 4:56 AM  Patient Name: [name]  Requesting Physician: Emeline Darling, MD    Consulting Physician: Dr. Chauncy Lean  Covered By: Team B: [phone]  Time consult called: 410  Time examined: 415     Reason for Consultation:   Spinal stenosis    Assessment:   [name] is a 62 y.o. female with PMH with deep vein thrombosis (DVT) in 2012, prior ulcerative colitis, and hypertension presenting with symptoms of lumbar stenosis with severe L3-L4 canal stenosis seen on CT. Exam with mild R LE DF EHL weakness otherwise non focal full strength.  Plan:   - Primary medical management per primary  - q4h neurochecks  - SBP goal normotension  - Na goal normonatremia  - Maintain normothermia, normoglycemia  - Imaging:  - MRI L spine wo  - Stat CTH for neuro decline.  - Activity/Positioning:  - ad lib  - Medications:  - Multimodal pain control with QD bowel regimen.  - No therapeutic anticoagulants/antiplatelets/NSAIDs  - Consults:  - PT/OT eval and treat  - DVT Prophylaxis:  - SCDs  - Chemo DVT PPx: hold  - Disposition:   - floor    Further neurosurgical planning pending MRI L spine.    Plan formulated and discussed with Dr. August Luz.      History:   [name] is a 62 year old female with a history of deep vein thrombosis (DVT) in 2012, prior ulcerative colitis, and hypertension who presents with worsening low back pain and bilateral leg pain. The patient reports that the pain started recently, about a month ago, and has significantly worsened over the last few days, prompting her to seek medical attention. The pain is localized to the middle to lower back and radiates to the front of both legs, reaching down to the knees.    The pain worsens when standing for long periods and improves when lying flat or leaning forward on something like a shopping cart. She denies any difficulties with fine motor movements, pain in the upper extremities, numbness, or tingling. There  is no numbness in the buttocks, but she does report some urinary urge incontinence, which has been more recent, starting a few months ago. She denies any bowel incontinence, numbness, or tingling in the legs.  Past Medical History:     Past Medical History:   Diagnosis Date    Chronic ulcerative colitis     nio issues for long time    Claustrophobia     Clotting disorder     DVT LLE 2012    Deep venous thrombosis of calf 2012    LLL/on xeralto//    GERD (gastroesophageal reflux disease)     Glaucoma NEC     bilat    Hypertensive disorder     controlled well with medication    Ovarian cyst, right     Plantar fasciitis     Seasonal allergies      I reviewed the past medical history myself.   Past Surgical History:     Past Surgical History:   Procedure Laterality Date    BUNIONECTOMY      left foot    CATARACT EXTRACTION      EYE SURGERY Bilateral 2013    laser surgery for glaucoma, bilateral trabeculectomy    GLAUCOMA SURGERY      HYSTERECTOMY      removed both ovaries and tubes    revision of hysterectomyl suture line  REVISION, BLEB, SCLERAL PATCH GRAFT Left 05/25/2019    Procedure: REVISION, BLEB;  Surgeon: Lamount Cranker, MD;  Location: [facility];  Service: Ophthalmology;  Laterality: Left;    TONSILLECTOMY      TRABECULECTOMY Left 03/22/2014    Procedure: TRABECULECTOMY;  Surgeon: Pecola Leisure, MD;  Location:  MAIN OR;  Service: Ophthalmology;  Laterality: Left;  TRABECULECTOMY REVISION, LEFT EYE  Q1=N  ANES=MAC  EQUIP=MITOMYCIN C, 0.4mg /cc, DISPENSE 5cc (ORDER TO FOLLOW)  MD REQ 30 MIN  ALLERGIES=COSOPT, AZOPT, ALPHAGAN  **Need RESIGHT SET from SPD    ureterectomy left       I reviewed the past surgical history myself.  Family History:     Family History   Problem Relation Age of Onset    Hypertension Father     Vision loss Father     Vision loss Brother     Hypertension Paternal Aunt     Vision loss Paternal Uncle      I reviewed the past family history myself.   Social History:      Social History     Socioeconomic History    Marital status: Married     Spouse name: Not on file    Number of children: Not on file    Years of education: Not on file    Highest education level: Not on file   Occupational History    Not on file   Tobacco Use    Smoking status: Former     Current packs/day: 0.00     Types: Cigarettes     Quit date: 12/21/2010     Years since quitting: 12.0    Smokeless tobacco: Never   Vaping Use    Vaping status: Never Used   Substance and Sexual Activity    Alcohol use: Yes     Alcohol/week: 1.0 standard drink of alcohol     Types: 1 Cans of beer per week     Comment: 1/week    Drug use: No    Sexual activity: Yes     Partners: Male     Birth control/protection: Surgical   Other Topics Concern    Not on file   Social History Narrative    Not on file     Social Determinants of Health     Financial Resource Strain: Not on file   Food Insecurity: No Food Insecurity (01/13/2023)    Hunger Vital Sign     Worried About Running Out of Food in the Last Year: Never true     Ran Out of Food in the Last Year: Never true   Transportation Needs: No Transportation Needs (01/13/2023)    PRAPARE - Therapist, art (Medical): No     Lack of Transportation (Non-Medical): No   Physical Activity: Not on file   Stress: Not on file   Social Connections: Not on file   Intimate Partner Violence: Not At Risk (01/13/2023)    Humiliation, Afraid, Rape, and Kick questionnaire     Fear of Current or Ex-Partner: No     Emotionally Abused: No     Physically Abused: No     Sexually Abused: No   Housing Stability: Low Risk  (01/13/2023)    Housing Stability Vital Sign     Unable to Pay for Housing in the Last Year: No     Number of Times Moved in the Last Year: 1     Homeless in  the Last Year: No     I reviewed the social history myself.   Allergies:   Allergies[1]  Reviewed in chart.    Medications:     Current Facility-Administered Medications   Medication Dose Route Frequency    [START ON  01/15/2023] enoxaparin  40 mg Subcutaneous Daily    metoprolol tartrate  50 mg Oral Q12H SCH    pantoprazole  40 mg Oral Daily    timolol  1 drop Left Eye Daily     Reviewed in chart.    Review of Systems:   A comprehensive review of systems was reviewed and noted as per HPI. All other systems were reviewed and are negative.    Physical Exam:   GCS E4 - spontaneous/V5 - alert/oriented/M6 - obeys commands = 15  Pulmonary: Normal respiratory effort, no audible wheezing  Cardiovascular: No pedal edema, pulses 2+ in bilat lower extremities    Mental Status:   A&O x 4  Follows commands  Recent and remote memory are intact.  Speech is clear without evidence of aphasia    Cranial Nerves:  -CN II: Visual fields full to bedside confrontation  -CN III, IV, VI: Pupils equal, round, and reactive to light 3mm; extraocular movements intact; no ptosis, Conjugate gaze  -CN V: Facial sensation intact in V1 through V3 distributions  -CN VII: Face symmetric  -CN VIII: Hearing intact to conversational speech  -CN IX, X: Palate elevates symmetrically; normal phonation.   -CN XI: Symmetric full strength of sternocleidomastoid and trapezius muscles  -CN XII: Tongue protrudes midline Motor: Muscle tone normal without spasticity or flaccidity. No atrophy.    Pronator drift none                UE's:    Deltoid  C5 Bicep  C5-6 Tricep  C6-7 Wrist Ext  C6-7 Grip  C8 IO  C8-T1   Right 5 5 5 5 5 5    Left 5 5 5 5 5 5      LE's:    HF  L2-3 KE  L3-4 DF  L4-5 EHL  L5 PF  S1   Right 5 5 5 4 5    Left 5 5 4+ 4+ 5      Sensory: Light touch intact throughout     Reflexes:  2+ DTR    No Hoffmann's sign bilaterally  No Clonus bilaterally     Patient declines rectal exam at this time. If there are findings on MRI the suggest possible surgery she would be willing.    Coordination: FTN intact. No tremors.       Labs Reviewed:     Lab Results   Component Value Date    WBC 8.30 01/14/2023    HGB 11.1 (L) 01/14/2023    HCT 32.6 (L) 01/14/2023    MCV 104.2 (H)  01/14/2023    PLT 250 01/14/2023     Lab Results   Component Value Date    NA 138 01/14/2023    K 4.3 01/14/2023    CL 106 01/14/2023    CO2 23 01/14/2023     Lab Results   Component Value Date    INR 1.0 01/14/2023    INR 1.4 (H) 01/02/2011    INR 1.1 01/01/2011    PT 10.8 01/14/2023    PT 16.7 (H) 01/02/2011    PT 13.7 01/01/2011       Rads:   Neuroimaging independently reviewed by me. Agree with radiology reports below.  Radiology Results (24 Hour)       Procedure Component Value Units Date/Time    CT Lumbar Spine without Contrast [[id number]] Collected: 01/13/23 2151    Order Status: Completed Updated: 01/13/23 2202    Narrative:      HISTORY: weak in lower extremities.     COMPARISON: 09/06/2020 and 01/20/2021    TECHNIQUE: CT of the lumbar spine performed without intravenous contrast.  Multiplanar reformatted images were created and reviewed. The following  dose reduction techniques were utilized: automated exposure control and/or  adjustment of the mA and/or KV according to patient size, and the use of an  iterative reconstruction technique.    CONTRAST: None.    FINDINGS:   Transitional lumbosacral anatomy with partial sacralization of L5. There is  an enlarged left L5 transverse process with pseudoarticulation with the  sacrum.  Diffuse bony demineralization.  There is 3 mm anterolisthesis of L3 on L4. Otherwise, sagittal alignment is  preserved. Severe height loss of T11 vertebral body, similar to prior  study.  Moderate height loss of T12 vertebral body with vertebral body  fragmentation, new since prior study.  Moderate height loss of L1, L2, and L3 vertebral bodies, similar to prior  study.  There is severe loss of disc height at L4-5 level.  Severe spinal canal stenosis at L3-L4 level due to combination of disc  bulge and ligamentum flavum thickening.  Partially visualized left pleural effusion. Fatty liver. There are  gallstones.      Impression:         1.Compared to prior exam of 01/20/2021, there  is new moderate compression  deformity of T12 vertebral body with associated bony fragmentation.  Although this is new from the prior exam of 2022, this is probably a  chronic fracture. Recommend correlation with history and examination.  2.Redemonstrated severe T11, moderate L1, L2, L3 compression deformities.  3.Disc bulge and ligamentum flavum thickening resulting in severe spinal  canal stenosis at L3-L4 level.  4.3-mm anterolisthesis of L3 on L4.  5.Diffuse bony demineralization.  6.Partially visualized left pleural effusion.    Vara Guardian, MD  01/13/2023 9:59 PM            Signed by: Anabel Halon, MD  Neurosurgery PGY-2  01/14/23  4:56 AM    Attestation/Disclaimers:     Signed by: Anabel Halon, MD         [1]   Allergies  Allergen Reactions    Latex Itching

## 2023-01-14 NOTE — Progress Notes (Signed)
DISPO-Home with family.  Transport-family     01/14/23 1441   Patient Type   Within 30 Days of Previous Admission? No   Healthcare Decisions   Interviewed: Spouse   Name of interviewee if other than the pt: Prochnow,Richard (Spouse)  [phone] (Mobile)   Interviewee Contact Information: [phone] (Mobile)   Orientation/Decision Making Abilities of Patient Alert and Oriented x3, able to make decisions   Advance Directive Patient does not have advance directive   Healthcare Agent Appointed No  (Petillo,Richard (Spouse)  [phone] (Mobile))   Prior to admission   Prior level of function Independent with ADLs  (PT DRIVES SELF FOR ALL NEEDS)   Type of Residence Private residence  (PT LIVES WITH SPOUSE IN ASFH)   Home Layout Two level  (2STE 1FOS TO BDRM)   Have running water, electricity, heat, etc? Yes   Living Arrangements Spouse/significant other   How do you get to your MD appointments? SELF   How do you get your groceries? SELF   Who fixes your meals? SELF   Who does your laundry? SELF   Who picks up your prescriptions? SELF   Dressing Independent   Grooming Independent   Feeding Independent   Bathing Independent   Toileting Independent   DME Currently at Home Other (Comment)  (NONE)   Home Care/Community Services None   Prior SNF admission? (Detail) NO   Prior Rehab admission? (Detail) NO   Discharge Planning   Support Systems Spouse/significant other   Anticipated [place] plan discussed with: Same as interviewed   Mode of transportation: Private car (family member)   Does the patient have perscription coverage? Yes   Financial Resource Strain   How hard is it for you to pay for the very basics like food, housing, medical care, and heating? Not hard   Housing Stability   In the last 12 months, was there a time when you were not able to pay the mortgage or rent on time? N   At any time in the past 12 months, were you homeless or living in a shelter (including now)? N   Transportation Needs   In the past 12 months,  has lack of transportation kept you from medical appointments or from getting medications? no   In the past 12 months, has lack of transportation kept you from meetings, work, or from getting things needed for daily living? No   Consults/Providers   PT Evaluation Needed 2   OT Evalulation Needed 2   SLP Evaluation Needed 2   Correct PCP listed in Epic? Yes   Family and PCP   PCP on file was verified as the current PCP? Yes   In case you are admitted, transferred or discharged, would like family notified? Yes   Name of family member to be notified Wessels,Richard (Spouse)  [phone] (Mobile)   In case you are admitted, transferred or discharged, would like your PCP notified? Yes   Important Message from Medicare Notice   Patient received 1st IMM Letter? n/a     Tivis Ringer, LMSW  [facility]  Social Worker/Care Management  [address]  [place], [place] [address]  Phone: [phone]

## 2023-01-14 NOTE — Plan of Care (Signed)
Neuro checks changed to q 4 hrs per NSGY recs.

## 2023-01-14 NOTE — Plan of Care (Signed)
Problem: Moderate/High Fall Risk Score >5  Goal: Patient will remain free of falls  Outcome: Progressing  Flowsheets  Taken 01/14/2023 1100 by Jennette Kettle, RN  High (Greater than 13):   HIGH-Visual cue at entrance to patient's room   HIGH-Bed alarm on at all times while patient in bed   HIGH-Utilize chair pad alarm for patient while in the chair   HIGH-Apply yellow "Fall Risk" arm band   HIGH-Pharmacy to initiate evaluation and intervention per protocol   HIGH-Initiate use of floor mats as appropriate   HIGH-Consider use of low bed  Taken 01/14/2023 0012 by Billey Chang, RN  VH High Risk (Greater than 13):   ALL REQUIRED MODERATE INTERVENTIONS   ALL REQUIRED LOW INTERVENTIONS   RED "HIGH FALL RISK" SIGNAGE   BED ALARM WILL BE ACTIVATED WHEN THE PATEINT IS IN BED WITH SIGNAGE "RESET BED ALARM"   A CHAIR PAD ALARM WILL BE USED WHEN PATIENT IS UP SITTING IN A CHAIR   PATIENT IS TO BE SUPERVISED FOR ALL TOILETING ACTIVITIES     Problem: Pain interferes with ability to perform ADL  Goal: Pain at adequate level as identified by patient  Outcome: Progressing  Flowsheets (Taken 01/14/2023 0012 by Billey Chang, RN)  Pain at adequate level as identified by patient:   Identify patient comfort function goal   Assess for risk of opioid induced respiratory depression, including snoring/sleep apnea. Alert healthcare team of risk factors identified.   Assess pain on admission, during daily assessment and/or before any "as needed" intervention(s)   Reassess pain within 30-60 minutes of any procedure/intervention, per Pain Assessment, Intervention, Reassessment (AIR) Cycle     Problem: Side Effects from Pain Analgesia  Goal: Patient will experience minimal side effects of analgesic therapy  Outcome: Progressing  Flowsheets (Taken 01/14/2023 0012 by Billey Chang, RN)  Patient will experience minimal side effects of analgesic therapy:   Monitor/assess patient's respiratory status (RR depth, effort, breath sounds)   Prevent/manage  side effects per LIP orders (i.e. nausea, vomiting, pruritus, constipation, urinary retention, etc.)   Assess for changes in cognitive function     Problem: Impaired Mobility  Goal: Mobility/Activity is maintained at optimal level for patient  Outcome: Progressing  Flowsheets (Taken 01/14/2023 0012 by Billey Chang, RN)  Mobility/activity is maintained at optimal level for patient: Encourage independent activity per ability     Problem: Peripheral Neurovascular Impairment  Goal: Extremity color, movement, sensation are maintained or improved  Outcome: Progressing  Flowsheets (Taken 01/14/2023 0012 by Billey Chang, RN)  Extremity color, movement, sensation are maintained or improved:   Assess and monitor application of corrective devices (cast, brace, splint), check skin integrity   Assess extremity for proper alignment     Problem: Compromised skin integrity  Goal: Skin integrity is maintained or improved  Outcome: Progressing  Flowsheets (Taken 01/14/2023 0012 by Billey Chang, RN)  Skin integrity is maintained or improved:   Assess Braden Scale every shift   Increase activity as tolerated/progressive mobility   Relieve pressure to bony prominences   Avoid shearing   Keep skin clean and dry   Encourage use of lotion/moisturizer on skin   Monitor patient's hygiene practices     Problem: Nutrition  Goal: Nutritional intake is adequate  Outcome: Progressing  Flowsheets (Taken 01/14/2023 0012 by Billey Chang, RN)  Nutritional intake is adequate: Consult/collaborate with Clinical Nutritionist     Problem: Neurological Deficit  Goal: Neurological status is stable or improving  Outcome: Progressing  Flowsheets (Taken  01/14/2023 0012 by Billey Chang, RN)  Neurological status is stable or improving:   Monitor/assess NIH Stroke Scale   Observe for seizure activity and initiate seizure precautions if indicated   Perform CAM Assessment     Problem: Potential for Aspiration  Goal: Risk of aspiration will be minimized  Outcome:  Progressing  Flowsheets (Taken 01/14/2023 0012 by Billey Chang, RN)  Risk of aspiration will be minimized:   Assess/monitor ability to swallow using dysphagia screen: Keep patient NPO if patient fails screening   Place swallow precaution signage above bed and supervise patient during oral intake     Problem: Communication Impairment  Goal: Will be able to express needs and understand communication  Outcome: Progressing  Flowsheets (Taken 01/14/2023 0012 by Billey Chang, RN)  Able to express needs and understand communication:   Provide alternative method of communication if needed   Consult/collaborate with Case Management/Social Work   Consult/collaborate with Speech Language Pathology (SLP)   Include patient care companion in decisions related to communication     Problem: Compromised Sensory Perception  Goal: Sensory Perception Interventions  Outcome: Progressing  Flowsheets (Taken 01/14/2023 0800)  Sensory Perception Interventions: Offload heels, Pad bony prominences, Reposition q 2hrs/turn Clock, Q2 hour skin assessment under devices if present     Problem: Compromised Moisture  Goal: Moisture level Interventions  Outcome: Progressing  Flowsheets (Taken 01/14/2023 0800)  Moisture level Interventions: Moisture wicking products, Moisture barrier cream     Problem: Compromised Activity/Mobility  Goal: Activity/Mobility Interventions  Outcome: Progressing  Flowsheets (Taken 01/14/2023 1135)  Activity/Mobility Interventions: Pad bony prominences, TAP Seated positioning system when OOB, Promote PMP, Reposition q 2 hrs / turn clock, Offload heels     Problem: Compromised Nutrition  Goal: Nutrition Interventions  Outcome: Progressing  Flowsheets (Taken 01/14/2023 1135)  Nutrition Interventions: Discuss nutrition at Rounds, I&Os, Document % meal eaten, Daily weights     Problem: Compromised Friction/Shear  Goal: Friction and Shear Interventions  Outcome: Progressing  Flowsheets (Taken 01/14/2023 1135)  Friction and Shear  Interventions: Pad bony prominences, Off load heels, HOB 30 degrees or less unless contraindicated, Consider: TAP seated positioning, Heel foams

## 2023-01-14 NOTE — Plan of Care (Signed)
Problem: Moderate/High Fall Risk Score >5  Goal: Patient will remain free of falls  01/14/2023 0012 by Billey Chang, RN  Outcome: Progressing  Flowsheets (Taken 01/14/2023 0012)  VH High Risk (Greater than 13):   ALL REQUIRED MODERATE INTERVENTIONS   ALL REQUIRED LOW INTERVENTIONS   RED "HIGH FALL RISK" SIGNAGE   BED ALARM WILL BE ACTIVATED WHEN THE PATEINT IS IN BED WITH SIGNAGE "RESET BED ALARM"   A CHAIR PAD ALARM WILL BE USED WHEN PATIENT IS UP SITTING IN A CHAIR   PATIENT IS TO BE SUPERVISED FOR ALL TOILETING ACTIVITIES  01/14/2023 0012 by Billey Chang, RN  Outcome: Progressing     Problem: Pain interferes with ability to perform ADL  Goal: Pain at adequate level as identified by patient  01/14/2023 0012 by Billey Chang, RN  Outcome: Progressing  Flowsheets (Taken 01/14/2023 0012)  Pain at adequate level as identified by patient:   Identify patient comfort function goal   Assess for risk of opioid induced respiratory depression, including snoring/sleep apnea. Alert healthcare team of risk factors identified.   Assess pain on admission, during daily assessment and/or before any "as needed" intervention(s)   Reassess pain within 30-60 minutes of any procedure/intervention, per Pain Assessment, Intervention, Reassessment (AIR) Cycle  01/14/2023 0012 by Billey Chang, RN  Outcome: Progressing     Problem: Side Effects from Pain Analgesia  Goal: Patient will experience minimal side effects of analgesic therapy  01/14/2023 0012 by Billey Chang, RN  Outcome: Progressing  Flowsheets (Taken 01/14/2023 0012)  Patient will experience minimal side effects of analgesic therapy:   Monitor/assess patient's respiratory status (RR depth, effort, breath sounds)   Prevent/manage side effects per LIP orders (i.e. nausea, vomiting, pruritus, constipation, urinary retention, etc.)   Assess for changes in cognitive function  01/14/2023 0012 by Billey Chang, RN  Outcome: Progressing     Problem: Impaired Mobility  Goal: Mobility/Activity is maintained at  optimal level for patient  01/14/2023 0012 by Billey Chang, RN  Outcome: Progressing  Flowsheets (Taken 01/14/2023 0012)  Mobility/activity is maintained at optimal level for patient: Encourage independent activity per ability  01/14/2023 0012 by Billey Chang, RN  Outcome: Progressing     Problem: Peripheral Neurovascular Impairment  Goal: Extremity color, movement, sensation are maintained or improved  01/14/2023 0012 by Billey Chang, RN  Outcome: Progressing  Flowsheets (Taken 01/14/2023 0012)  Extremity color, movement, sensation are maintained or improved:   Assess and monitor application of corrective devices (cast, brace, splint), check skin integrity   Assess extremity for proper alignment  01/14/2023 0012 by Billey Chang, RN  Outcome: Progressing     Problem: Compromised skin integrity  Goal: Skin integrity is maintained or improved  01/14/2023 0012 by Billey Chang, RN  Outcome: Progressing  Flowsheets (Taken 01/14/2023 0012)  Skin integrity is maintained or improved:   Assess Braden Scale every shift   Increase activity as tolerated/progressive mobility   Relieve pressure to bony prominences   Avoid shearing   Keep skin clean and dry   Encourage use of lotion/moisturizer on skin   Monitor patient's hygiene practices  01/14/2023 0012 by Billey Chang, RN  Outcome: Progressing     Problem: Nutrition  Goal: Nutritional intake is adequate  01/14/2023 0012 by Billey Chang, RN  Outcome: Progressing  Flowsheets (Taken 01/14/2023 0012)  Nutritional intake is adequate: Consult/collaborate with Clinical Nutritionist  01/14/2023 0012 by Billey Chang, RN  Outcome: Progressing     Problem: Neurological Deficit  Goal: Neurological status is  stable or improving  Outcome: Progressing  Flowsheets (Taken 01/14/2023 0012)  Neurological status is stable or improving:   Monitor/assess NIH Stroke Scale   Observe for seizure activity and initiate seizure precautions if indicated   Perform CAM Assessment     Problem: Potential for Aspiration  Goal: Risk of  aspiration will be minimized  Outcome: Progressing  Flowsheets (Taken 01/14/2023 0012)  Risk of aspiration will be minimized:   Assess/monitor ability to swallow using dysphagia screen: Keep patient NPO if patient fails screening   Place swallow precaution signage above bed and supervise patient during oral intake     Problem: Communication Impairment  Goal: Will be able to express needs and understand communication  Outcome: Progressing  Flowsheets (Taken 01/14/2023 0012)  Able to express needs and understand communication:   Provide alternative method of communication if needed   Consult/collaborate with Case Management/Social Work   Consult/collaborate with Speech Language Pathology (SLP)   Include patient care companion in decisions related to communication

## 2023-01-14 NOTE — Progress Notes (Addendum)
Adult Observation Progress Note        Shift Note:  Introduced self to patient and updated whiteboard during bedside shift report with off going RN  General:   Neuro: AAOx4, Weakness to BLE.   Cardio: Not on tele  Resp: Clear on RA   Integ: Redness/rash/excoriation to buttocks and scarum. Scattered scratches on arms.  MSK: High fall risk, weakness in BLE. Stand and pivot to [facility] with 1x assist  GI: Continent LBM 7/24  GU: Continent  IV Access: 18g LAC     Events during shift:  MRI Lumbar Spine  Neuro checks  Pain management-Tylenol  BM during shift:   No    Social/Family Visits:  Husband and sister in law at bedside, Updated with POC.     Patient transferring to Room 726, report called to [facility] RN

## 2023-01-14 NOTE — Consults (Addendum)
Wound Ostomy Continence Nurse Consult Note:    Date Time: 01/14/23 4:14 PM  Patient Name: [name], [name]  Consulting Service: [facility] Day: 2     Admitted with Spinal stenosis of lumbosacral region.      Reason for Consult / Follow Up   POA pressure injury     Assessment & Plan       Wound 01/13/23 Pressure Injury Sacrum (Active)   Date First Assessed: 01/13/23   Primary Wound Type: Pressure Injury  Location: Sacrum  Wound Description: pale open wound  Present on Original Admission: Yes      Assessments 01/14/2023  4:00 PM   Wound Base Description Slick   Pressure Injury Stage Stage 3   Pressure injury stage(Read Only) Stage 3   WOCN verified Pressure Staging Yes   Wound Length (cm) 4 cm   Wound Width (cm) 5 cm   Wound Surface Area (cm^2) 20 cm^2   Drainage Amount Small   Drainage Description Serous   Topical Hydrophillic Wound Paste   Dressing Foam       Wound 01/13/23 Pressure Injury (Active)   Date First Assessed: 01/13/23   Primary Wound Type: Pressure Injury  Wound Description: upside down V shape along gluteal crease edge  Present on Original Admission: Yes      Assessments 01/14/2023  4:00 PM   Pressure Injury Stage Deep Tissue   Pressure injury stage(Read Only) Deep Tissue   WOCN verified Pressure Staging Yes   Wound Length (cm) 12 cm   Wound Width (cm) 12 cm   Wound Surface Area (cm^2) 144 cm^2   Topical Balsam of [place]/Caster Oil        Wound Photography:     DTI POA      Patient Education: must turn frequently to allow wound to heal.      Specialty Bed: Centrella max      Past Medical History     Medical History[1]  Past Surgical History:   Procedure Laterality Date    BUNIONECTOMY      left foot    CATARACT EXTRACTION      EYE SURGERY Bilateral 2013    laser surgery for glaucoma, bilateral trabeculectomy    GLAUCOMA SURGERY      HYSTERECTOMY      removed both ovaries and tubes    revision of hysterectomyl suture line      REVISION, BLEB, SCLERAL PATCH GRAFT Left 05/25/2019    Procedure:  REVISION, BLEB;  Surgeon: Lamount Cranker, MD;  Location: [facility] OR;  Service: Ophthalmology;  Laterality: Left;    TONSILLECTOMY      TRABECULECTOMY Left 03/22/2014    Procedure: TRABECULECTOMY;  Surgeon: Pecola Leisure, MD;  Location: [facility];  Service: Ophthalmology;  Laterality: Left;  TRABECULECTOMY REVISION, LEFT EYE  Q1=N  ANES=MAC  EQUIP=MITOMYCIN C, 0.4mg /cc, DISPENSE 5cc (ORDER TO FOLLOW)  MD REQ 30 MIN  ALLERGIES=COSOPT, AZOPT, ALPHAGAN  **Need RESIGHT SET from SPD    ureterectomy left         Plan/Follow Up:   Patient must be kept clean and dry.  She can assist with turns.  Triad  cream will promote dryness and protect wound while promoting wound healing. It can be used with or without sacral foam dressing.  WOC nurse will continue to follow    Rip Harbour MSN, RN, Rober Minion  [phone]           [1]   Past  Medical History:  Diagnosis Date    Chronic ulcerative colitis     nio issues for long time    Claustrophobia     Clotting disorder     DVT LLE 2012    Deep venous thrombosis of calf 2012    LLL/on xeralto//    GERD (gastroesophageal reflux disease)     Glaucoma NEC     bilat    Hypertensive disorder     controlled well with medication    Ovarian cyst, right     Plantar fasciitis     Seasonal allergies

## 2023-01-14 NOTE — Progress Notes (Signed)
Involvement - Spine     Reason for visit - Delivery      Ordered by Mammie Russian, Enis Slipper, PA                                                                          Rx - TLSO     Clinical plan - DAW     Justification if custom - TLSO sleeq max     Clinical outcome - Brace meets specifications of order and is free from defects.     Procedure - Delivered     Goal - Provide support and limit range of motion     Patient caretaker is able to properly don and doff device - YES

## 2023-01-14 NOTE — UM Notes (Signed)
01/13/23 2237  Adult Admit to Observation  Once         01/13/23 2238       Patient Name: [name]       DOB: 1961/02/02  MRN: [id number]    January 14, 2023     62 year old female with a history of deep vein thrombosis (DVT) in 2012, prior ulcerative colitis, and hypertension who presents with worsening low back pain and bilateral leg pain. The patient reports that the pain started recently, about a month ago, and has significantly worsened over the last few days, prompting her to seek medical attention. The pain is localized to the middle to lower back and radiates to the front of both legs, reaching down to the knees.     The pain worsens when standing for long periods and improves when lying flat or leaning forward on something like a shopping cart. She denies any difficulties with fine motor movements, pain in the upper extremities, numbness, or tingling. There is no numbness in the buttocks, but she does report some urinary urge incontinence, which has been more recent, starting a few months ago. She denies any bowel incontinence, numbness, or tingling in the legs.    Temp:  [97.4 F (36.3 C)-98.3 F (36.8 C)]   Heart Rate:  [71-108]   Resp Rate:  [16-18]   BP: (122-170)/(82-116)   SpO2:  [98 %-100 %]   Height:  [157.5 cm (5\' 2" )]   Weight:  [52 kg (114 lb 10.2 oz)]   BMI (calculated):  [21]     RA    Last recorded pain score:  Pain Scale Used: Numeric Scale (0-10)  Pain Score: 5-moderate pain       Recent Labs   Lab 01/14/23  0330 01/13/23  2203   WBC 8.30 10.03*   Hemoglobin 11.1* 11.9   Hematocrit 32.6* 35.3   Platelet Count 250 243       Recent Labs   Lab 01/14/23  0330 01/13/23  2111   Sodium 138 140   Potassium 4.3 4.4   Chloride 106 106   CO2 23 25   BUN 10 11   Creatinine 0.6 0.7   GFR >60.0 >60.0   Glucose 79 80   Calcium 8.4* 8.8     CT Lumbar Spine without Contrast   1.Compared to prior exam of 01/20/2021, there is new moderate compression  deformity of T12 vertebral body with associated bony  fragmentation.  Although this is new from the prior exam of 2022, this is probably a  chronic fracture. Recommend correlation with history and examination.  2.Redemonstrated severe T11, moderate L1, L2, L3 compression deformities.  3.Disc bulge and ligamentum flavum thickening resulting in severe spinal  canal stenosis at L3-L4 level.  4.3-mm anterolisthesis of L3 on L4.  5.Diffuse bony demineralization.  6.Partially visualized left pleural effusion.      MRI Lumbar  1.T10 vertebral body superior endplate incomplete burst fracture, with 2 mm  retropulsion, with marrow edema consistent with recent likely acute  fracture.  2.Focal abnormal fluid collection measuring 6 mm centered at T12 vertebrae  extending from mid inferior T11 to mid L1 level indenting the anterior  thecal sac displacing the thecal sac nonspecific but likely representing  small hematoma.   3.T11 vertebral body subacute compression fracture, unchanged in alignment.     4.T12 vertebral body superior endplate subacute compression fracture,  unchanged alignment.  5.Old superior endplate L1 L3 compression fractures, unchanged.  Scheduled Meds:  Current Facility-Administered Medications   Medication Dose Route Frequency    metoprolol tartrate  50 mg Oral Q12H SCH    pantoprazole  40 mg Oral Daily    timolol  1 drop Left Eye Daily     Continuous Infusions:  PRN Meds:.acetaminophen, calcium carbonate, cetirizine, diazePAM, hydrALAZINE, loperamide, magnesium sulfate, melatonin, naloxone, ondansetron **OR** ondansetron, potassium chloride **OR** potassium chloride, senna-docusate  Plan:      Primary medical management per primary  - q4h neurochecks  - SBP goal normotension  - Na goal normonatremia  - Maintain normothermia, normoglycemia  - Imaging:  - MRI L spine wo  - Stat CTH for neuro decline.  - Activity/Positioning:  - ad lib  - Medications:  - Multimodal pain control with QD bowel regimen.  - No therapeutic anticoagulants/antiplatelets/NSAIDs  -  Consults:  - PT/OT eval and treat  - DVT Prophylaxis:  - SCDs  - Chemo DVT PPx: hold    Westly Pam, RN, BSN   Utilization Review (PRN)  [facility]  [address]  [place], [place] [address]  T [phone] (VM only)  F [phone]  [email or web address]

## 2023-01-14 NOTE — H&P (Signed)
[facility] ADMISSION HISTORY AND PHYSICAL EXAM    Date Time: 01/14/23 12:05 AM  Patient Name: [name]  Attending Physician: Alejandra Darling, MD  Primary Care Physician: Alejandra Must D, MD    CC: bilateral leg weakness, urinary incontinence      History of Presenting Illness:   [name] is a 62 y.o. female hx DVT 2012, prior ulcerative colitis, HTN who presents with bilateral leg weakness, urinary incontinence. This has been for 3 days. She has difficulty making to the bathroom so had urinary incontinence. She also notes low back pain. She is supposed to be on Xarelto but has not taken it for 2 days as she ran out. These symptoms are sudden onset, moderate intensity, without alleviating factors.    Past Medical History:     Past Medical History:   Diagnosis Date    Chronic ulcerative colitis     nio issues for long time    Claustrophobia     Clotting disorder     DVT LLE 2012    Deep venous thrombosis of calf 2012    LLL/on xeralto//    GERD (gastroesophageal reflux disease)     Glaucoma NEC     bilat    Hypertensive disorder     controlled well with medication    Ovarian cyst, right     Plantar fasciitis     Seasonal allergies        Past Surgical History:     Past Surgical History:   Procedure Laterality Date    BUNIONECTOMY      left foot    CATARACT EXTRACTION      EYE SURGERY Bilateral 2013    laser surgery for glaucoma, bilateral trabeculectomy    GLAUCOMA SURGERY      HYSTERECTOMY      removed both ovaries and tubes    revision of hysterectomyl suture line      REVISION, BLEB, SCLERAL PATCH GRAFT Left 05/25/2019    Procedure: REVISION, BLEB;  Surgeon: Lamount Cranker, MD;  Location: [facility];  Service: Ophthalmology;  Laterality: Left;    TONSILLECTOMY      TRABECULECTOMY Left 03/22/2014    Procedure: TRABECULECTOMY;  Surgeon: Pecola Leisure, MD;  Location: [facility];  Service: Ophthalmology;  Laterality: Left;  TRABECULECTOMY REVISION, LEFT EYE  Q1=N  ANES=MAC   EQUIP=MITOMYCIN C, 0.4mg /cc, DISPENSE 5cc (ORDER TO FOLLOW)  MD REQ 30 MIN  ALLERGIES=COSOPT, AZOPT, ALPHAGAN  **Need RESIGHT SET from SPD    ureterectomy left         Family History:     Family History   Problem Relation Age of Onset    Hypertension Father     Vision loss Father     Vision loss Brother     Hypertension Paternal Aunt     Vision loss Paternal Uncle        Social History:     Social History     Socioeconomic History    Marital status: Married     Spouse name: Not on file    Number of children: Not on file    Years of education: Not on file    Highest education level: Not on file   Occupational History    Not on file   Tobacco Use    Smoking status: Former     Current packs/day: 0.00     Types: Cigarettes     Quit date: 12/21/2010  Years since quitting: 12.0    Smokeless tobacco: Never   Vaping Use    Vaping status: Never Used   Substance and Sexual Activity    Alcohol use: Yes     Alcohol/week: 1.0 standard drink of alcohol     Types: 1 Cans of beer per week     Comment: 1/week    Drug use: No    Sexual activity: Yes     Partners: Male     Birth control/protection: Surgical   Other Topics Concern    Not on file   Social History Narrative    Not on file     Social Determinants of Health     Financial Resource Strain: Not on file   Food Insecurity: No Food Insecurity (01/13/2023)    Hunger Vital Sign     Worried About Running Out of Food in the Last Year: Never true     Ran Out of Food in the Last Year: Never true   Transportation Needs: No Transportation Needs (01/13/2023)    PRAPARE - Therapist, art (Medical): No     Lack of Transportation (Non-Medical): No   Physical Activity: Not on file   Stress: Not on file   Social Connections: Not on file   Intimate Partner Violence: Not At Risk (01/13/2023)    Humiliation, Afraid, Rape, and Kick questionnaire     Fear of Current or Ex-Partner: No     Emotionally Abused: No     Physically Abused: No     Sexually Abused: No   Housing  Stability: Low Risk  (01/13/2023)    Housing Stability Vital Sign     Unable to Pay for Housing in the Last Year: No     Number of Times Moved in the Last Year: 1     Homeless in the Last Year: No       Allergies:   Allergies[1]    Medications:     Medications Prior to Admission   Medication Sig    desloratadine (CLARINEX) 5 MG tablet Take 1 tablet (5 mg) by mouth daily    dexlansoprazole (Dexilant) 60 MG capsule Take 1 capsule (60 mg) by mouth daily    ergocalciferol (ERGOCALCIFEROL) 1.25 MG (50000 UT) capsule 1 capsule (50,000 Units)    metoprolol (LOPRESSOR) 100 MG tablet Take 1 tablet (100 mg) by mouth daily    rivaroxaban (XARELTO) 10 MG Tab Take 1 tablet (10 mg) by mouth daily    timolol (TIMOPTIC) 0.5 % ophthalmic solution Place 1 drop into the left eye daily    acetaminophen-codeine (TYLENOL #3) 300-30 MG per tablet Take 1 tablet by mouth every 6 (six) hours as needed for Pain    hydrocortisone 2.5 % cream APPLY TO AFFECTED AREA TWICE DAILY       Current Facility-Administered Medications   Medication Dose Route Frequency       Review of Systems:   All other systems were reviewed and are negative except: as above    Physical Exam:     Vitals:    01/13/23 2325   BP: (!) 160/99   Pulse: (!) 108   Resp: 16   Temp: 97.9 F (36.6 C)   SpO2: 99%       Intake and Output Summary (Last 24 hours) at Date Time  No intake or output data in the 24 hours ending 01/14/23 0005    General: awake, alert, oriented x 3; no acute distress.  HEENT:  Normocephalic, atraumatic. MMM  Neck: supple  Cardiovascular: regular rate and rhythm, no murmurs, rubs or gallops  Lungs: clear to auscultation bilaterally, without wheezing, rhonchi, or rales  Abdomen: soft, non-tender, non-distended; no palpable masses, no hepatosplenomegaly, normoactive bowel sounds, no rebound or guarding  Extremities: no clubbing, cyanosis, or edema  Neuro: cranial nerves grossly intact, strength 5/5 in upper and 3/5 hip flexion and knee extension and 4+/5  dorsiflexion and plantarflexion, sensation intact, knee DTRs 2/4, follows commands  Skin: no rashes or lesions noted      Labs:     Results       Procedure Component Value Units Date/Time    Vitamin B12 [[id number]]  (Normal) Collected: 01/13/23 2111    Specimen: Blood, Venous Updated: 01/14/23 0004     Vitamin B-12 493 pg/mL     Sedimentation Rate (ESR) [[id number]]  (Abnormal) Collected: 01/13/23 2203    Specimen: Blood, Venous Updated: 01/13/23 2242     Sed Rate 25 mm/Hr     CBC with Differential (Order) [[id number]]  (Abnormal) Collected: 01/13/23 2203    Specimen: Blood, Venous Updated: 01/13/23 2225    Narrative:      The following orders were created for panel order CBC with Differential (Order).  Procedure                               Abnormality         Status                     ---------                               -----------         ------                     CBC with Differential (C.Marland KitchenMarland Kitchen[[id number]]  Abnormal            Final result                 Please view results for these tests on the individual orders.    CBC with Differential (Component) [[id number]]  (Abnormal) Collected: 01/13/23 2203    Specimen: Blood, Venous Updated: 01/13/23 2225     WBC 10.03 x10 3/uL      Hemoglobin 11.9 g/dL      Hematocrit 24.4 %      Platelet Count 243 x10 3/uL      MPV 9.6 fL      RBC 3.42 x10 6/uL      MCV 103.2 fL      MCH 34.8 pg      MCHC 33.7 g/dL      RDW 16 %      nRBC % 0.0 /100 WBC      Absolute nRBC 0.00 x10 3/uL      Preliminary Absolute Neutrophil Count 7.24 x10 3/uL      Neutrophils % 72.2 %      Lymphocytes % 18.6 %      Monocytes % 7.1 %      Eosinophils % 1.6 %      Basophils % 0.2 %      Immature Granulocytes % 0.3 %      Absolute Neutrophils 7.24 x10 3/uL      Absolute Lymphocytes 1.87 x10 3/uL      Absolute Monocytes  0.71 x10 3/uL      Absolute Eosinophils 0.16 x10 3/uL      Absolute Basophils 0.02 x10 3/uL      Absolute Immature Granulocytes 0.03 x10 3/uL     Comprehensive Metabolic Panel [[id number]]   (Abnormal) Collected: 01/13/23 2111    Specimen: Blood, Venous Updated: 01/13/23 2154     Glucose 80 mg/dL      BUN 11 mg/dL      Creatinine 0.7 mg/dL      Sodium 638 mEq/L      Potassium 4.4 mEq/L      Chloride 106 mEq/L      CO2 25 mEq/L      Calcium 8.8 mg/dL      Anion Gap 9.0     GFR >60.0 mL/min/1.73 m2      AST (SGOT) 41 U/L      ALT 30 U/L      Alkaline Phosphatase 214 U/L      Albumin 2.2 g/dL      Protein, Total 6.0 g/dL      Globulin 3.8 g/dL      Albumin/Globulin Ratio 0.6     Bilirubin, Total 0.8 mg/dL     Magnesium [[id number]]  (Normal) Collected: 01/13/23 2111    Specimen: Blood, Venous Updated: 01/13/23 2154     Magnesium 1.9 mg/dL     Creatine Kinase (CK) [[id number]]  (Abnormal) Collected: 01/13/23 2111    Specimen: Blood, Venous Updated: 01/13/23 2154     Creatine Kinase (CK) 19 U/L     C Reactive Protein [[id number]]  (Abnormal) Collected: 01/13/23 2111    Specimen: Blood, Venous Updated: 01/13/23 2154     C-Reactive Protein 2.1 mg/dL             Radiology Results (24 Hour)       Procedure Component Value Units Date/Time    CT Lumbar Spine without Contrast [[id number]] Collected: 01/13/23 2151    Order Status: Completed Updated: 01/13/23 2202    Narrative:      HISTORY: weak in lower extremities.     COMPARISON: 09/06/2020 and 01/20/2021    TECHNIQUE: CT of the lumbar spine performed without intravenous contrast.  Multiplanar reformatted images were created and reviewed. The following  dose reduction techniques were utilized: automated exposure control and/or  adjustment of the mA and/or KV according to patient size, and the use of an  iterative reconstruction technique.    CONTRAST: None.    FINDINGS:   Transitional lumbosacral anatomy with partial sacralization of L5. There is  an enlarged left L5 transverse process with pseudoarticulation with the  sacrum.  Diffuse bony demineralization.  There is 3 mm anterolisthesis of L3 on L4. Otherwise, sagittal alignment is  preserved. Severe height loss  of T11 vertebral body, similar to prior  study.  Moderate height loss of T12 vertebral body with vertebral body  fragmentation, new since prior study.  Moderate height loss of L1, L2, and L3 vertebral bodies, similar to prior  study.  There is severe loss of disc height at L4-5 level.  Severe spinal canal stenosis at L3-L4 level due to combination of disc  bulge and ligamentum flavum thickening.  Partially visualized left pleural effusion. Fatty liver. There are  gallstones.      Impression:         1.Compared to prior exam of 01/20/2021, there is new moderate compression  deformity of T12 vertebral body with associated bony fragmentation.  Although this is new from the prior exam  of 2022, this is probably a  chronic fracture. Recommend correlation with history and examination.  2.Redemonstrated severe T11, moderate L1, L2, L3 compression deformities.  3.Disc bulge and ligamentum flavum thickening resulting in severe spinal  canal stenosis at L3-L4 level.  4.3-mm anterolisthesis of L3 on L4.  5.Diffuse bony demineralization.  6.Partially visualized left pleural effusion.    Vara Guardian, MD  01/13/2023 9:59 PM              Assessment:   61 y.o. female hx DVT 2012, prior ulcerative colitis, HTN who presents with bilateral leg weakness, urinary incontinence.     Plan:   Bilateral leg weakness, urinary incontinence - No back pain. CK normal. CT lumbar spine suggested L3-L4 canal stenosis. It also mentioned T12 fracture new since 01/20/21 but likely chronic. MRI lumbar spine w/wo contrast. PT/OT.    Hx DVT 2012, prior ulcerative colitis, HTN - Noted.     DVT prophy: SCDs. Lovenox.   Diet: regular  Code status: full  Status: observation spine ward                   Status/Rationale: observation spine ward    Signed by: Alejandra Darling, MD  cc:Le, Jess Barters, MD         [1]   Allergies  Allergen Reactions    Latex Itching

## 2023-01-14 NOTE — Plan of Care (Signed)
[facility] PROGRESS NOTE    Date Time: 01/14/23 7:42 AM  Patient Name: [name]  Attending Physician: Frederich Cha, DO      Assessment   62 y.o. female hx DVT 2012, prior ulcerative colitis, HTN who presents with bilateral leg weakness, urinary incontinence likely secondary to 6 mm fluid collection at the T12 vertebrae extending to the mid inferior T11 and mid L1 indenting the anterior thecal sac likely from a small hematoma.      Plan   #Back pain from an acute T10 vertebral fracture with 2 mm retropulsion  #Bilateral leg weakness, urinary incontinence likely from focal fluid collection measuring 6 mm extending from the inferior T11 to mid L1 level causing indentation of the anterior thecal sac displacing the thecal sac likely from an epidural small hematoma  #Subacute T11 vertebral fracture, and subacute T12 vertebral fracture  #Old L1-L3 compression fractures  -Transfer patient to the neuro [facility]  -Neurochecks every 2 hours  -Fall, aspiration, delirium precautions  -Analgesia CK normal.   -CT lumbar spine suggested L3-L4 canal stenosis. It also mentioned T12 fracture new since 01/20/21 but likely chronic.   -MRI lumbar spine w/wo contrast reviewed.  -Neurosurgery was consulted for vertebral fractures and possible epidural small hematoma at the T12 level.  -No NSAIDs, anticoagulation, or antiplatelet therapy.  -TLSO back brace ordered  -Patient's last dose of Xarelto was on 01/07/2023 or 01/08/2023.     #Hx DVT 2012, prior ulcerative colitis, HTN - Noted.      DVT prophy: SCDs. Lovenox.   Diet: regular  Code status: full  Status: observation spine ward                Recent Labs   Lab 01/14/23  0330 01/13/23  2203   Hemoglobin 11.1* 11.9   Hematocrit 32.6* 35.3   MCV 104.2* 103.2*   WBC 8.30 10.03*   Platelet Count 250 243         Anemia Diagnosis: Acute: Acute Anemia, Unspecified             History of Presenting Illness and Interval History/24 hour Events:   CC: bilateral leg weakness, urinary  incontinence     HPI per Admitting Provider  "  [name] is a 62 y.o. female hx DVT 2012, prior ulcerative colitis, HTN who presents with bilateral leg weakness, urinary incontinence. This has been for 3 days. She has difficulty making to the bathroom so had urinary incontinence. She also notes low back pain. She is supposed to be on Xarelto but has not taken it for 2 days as she ran out. These symptoms are sudden onset, moderate intensity, without alleviating factors.  "    7/25: Patient reports that she fell into the wall approximately week ago.  She ran out of Xarelto anticoagulation medications on Thursday or Friday of last week.  She has not taken Xarelto for at least 6 days.  Patient does have intermittent low back pain.    Physical Exam:     Vitals:    01/14/23 0713   BP: (!) 136/93   Pulse: 91   Resp: 16   Temp: 97.4 F (36.3 C)   SpO2: 100%       Intake and Output Summary (Last 24 hours) at Date Time  No intake or output data in the 24 hours ending 01/14/23 0742    General: awake, alert, oriented x 3; no acute distress.  HEENT: Normocephalic, atraumatic. MMM  Neck: supple  Cardiovascular:  regular rate and rhythm, no murmurs, rubs or gallops  Lungs: clear to auscultation bilaterally, without wheezing, rhonchi, or rales  Abdomen: soft, non-tender, non-distended; no palpable masses, no hepatosplenomegaly, normoactive bowel sounds, no rebound or guarding  Extremities: no clubbing, cyanosis, or edema  Neuro: cranial nerves grossly intact, strength 5/5 in upper and 3/5 hip flexion and knee extension and 4+/5 dorsiflexion and plantarflexion, sensation intact, knee DTRs 2/4, follows commands  Skin: no rashes or lesions noted      Medications:     Current Facility-Administered Medications   Medication Dose Route Frequency    [START ON 01/15/2023] enoxaparin  40 mg Subcutaneous Daily    metoprolol tartrate  50 mg Oral Q12H SCH    pantoprazole  40 mg Oral Daily    timolol  1 drop Left Eye Daily         Labs:      Results       Procedure Component Value Units Date/Time    Basic Metabolic Panel [[id number]]  (Abnormal) Collected: 01/14/23 0330    Specimen: Blood, Venous Updated: 01/14/23 0443     Glucose 79 mg/dL      BUN 10 mg/dL      Creatinine 0.6 mg/dL      Calcium 8.4 mg/dL      Sodium 469 mEq/L      Potassium 4.3 mEq/L      Chloride 106 mEq/L      CO2 23 mEq/L      Anion Gap 9.0     GFR >60.0 mL/min/1.73 m2     PT/APTT [[id number]]  (Abnormal) Collected: 01/14/23 0330    Specimen: Blood, Venous Updated: 01/14/23 0424     PT 10.8 sec      INR 1.0     PTT 25 sec     CBC without Differential [[id number]]  (Abnormal) Collected: 01/14/23 0330    Specimen: Blood, Venous Updated: 01/14/23 0419     WBC 8.30 x10 3/uL      Hemoglobin 11.1 g/dL      Hematocrit 53.6 %      Platelet Count 250 x10 3/uL      MPV 9.8 fL      RBC 3.13 x10 6/uL      MCV 104.2 fL      MCH 35.5 pg      MCHC 34.0 g/dL      RDW 16 %      nRBC % 0.0 /100 WBC      Absolute nRBC 0.00 x10 3/uL     Vitamin B12 [[id number]]  (Normal) Collected: 01/13/23 2111    Specimen: Blood, Venous Updated: 01/14/23 0004     Vitamin B-12 493 pg/mL     Sedimentation Rate (ESR) [[id number]]  (Abnormal) Collected: 01/13/23 2203    Specimen: Blood, Venous Updated: 01/13/23 2242     Sed Rate 25 mm/Hr     CBC with Differential (Order) [[id number]]  (Abnormal) Collected: 01/13/23 2203    Specimen: Blood, Venous Updated: 01/13/23 2225    Narrative:      The following orders were created for panel order CBC with Differential (Order).  Procedure                               Abnormality         Status                     ---------                               -----------         ------  CBC with Differential (C.Marland KitchenMarland Kitchen[[id number]]  Abnormal            Final result                 Please view results for these tests on the individual orders.    CBC with Differential (Component) [[id number]]  (Abnormal) Collected: 01/13/23 2203    Specimen: Blood, Venous Updated: 01/13/23 2225      WBC 10.03 x10 3/uL      Hemoglobin 11.9 g/dL      Hematocrit 32.9 %      Platelet Count 243 x10 3/uL      MPV 9.6 fL      RBC 3.42 x10 6/uL      MCV 103.2 fL      MCH 34.8 pg      MCHC 33.7 g/dL      RDW 16 %      nRBC % 0.0 /100 WBC      Absolute nRBC 0.00 x10 3/uL      Preliminary Absolute Neutrophil Count 7.24 x10 3/uL      Neutrophils % 72.2 %      Lymphocytes % 18.6 %      Monocytes % 7.1 %      Eosinophils % 1.6 %      Basophils % 0.2 %      Immature Granulocytes % 0.3 %      Absolute Neutrophils 7.24 x10 3/uL      Absolute Lymphocytes 1.87 x10 3/uL      Absolute Monocytes 0.71 x10 3/uL      Absolute Eosinophils 0.16 x10 3/uL      Absolute Basophils 0.02 x10 3/uL      Absolute Immature Granulocytes 0.03 x10 3/uL     Comprehensive Metabolic Panel [[id number]]  (Abnormal) Collected: 01/13/23 2111    Specimen: Blood, Venous Updated: 01/13/23 2154     Glucose 80 mg/dL      BUN 11 mg/dL      Creatinine 0.7 mg/dL      Sodium 630 mEq/L      Potassium 4.4 mEq/L      Chloride 106 mEq/L      CO2 25 mEq/L      Calcium 8.8 mg/dL      Anion Gap 9.0     GFR >60.0 mL/min/1.73 m2      AST (SGOT) 41 U/L      ALT 30 U/L      Alkaline Phosphatase 214 U/L      Albumin 2.2 g/dL      Protein, Total 6.0 g/dL      Globulin 3.8 g/dL      Albumin/Globulin Ratio 0.6     Bilirubin, Total 0.8 mg/dL     Magnesium [[id number]]  (Normal) Collected: 01/13/23 2111    Specimen: Blood, Venous Updated: 01/13/23 2154     Magnesium 1.9 mg/dL     Creatine Kinase (CK) [[id number]]  (Abnormal) Collected: 01/13/23 2111    Specimen: Blood, Venous Updated: 01/13/23 2154     Creatine Kinase (CK) 19 U/L     C Reactive Protein [[id number]]  (Abnormal) Collected: 01/13/23 2111    Specimen: Blood, Venous Updated: 01/13/23 2154     C-Reactive Protein 2.1 mg/dL               Radiology:     Radiology Results (24 Hour)       Procedure Component Value Units Date/Time    CT Lumbar Spine without Contrast [[id number]] Collected: 01/13/23  2151    Order Status: Completed  Updated: 01/13/23 2202    Narrative:      HISTORY: weak in lower extremities.     COMPARISON: 09/06/2020 and 01/20/2021    TECHNIQUE: CT of the lumbar spine performed without intravenous contrast.  Multiplanar reformatted images were created and reviewed. The following  dose reduction techniques were utilized: automated exposure control and/or  adjustment of the mA and/or KV according to patient size, and the use of an  iterative reconstruction technique.    CONTRAST: None.    FINDINGS:   Transitional lumbosacral anatomy with partial sacralization of L5. There is  an enlarged left L5 transverse process with pseudoarticulation with the  sacrum.  Diffuse bony demineralization.  There is 3 mm anterolisthesis of L3 on L4. Otherwise, sagittal alignment is  preserved. Severe height loss of T11 vertebral body, similar to prior  study.  Moderate height loss of T12 vertebral body with vertebral body  fragmentation, new since prior study.  Moderate height loss of L1, L2, and L3 vertebral bodies, similar to prior  study.  There is severe loss of disc height at L4-5 level.  Severe spinal canal stenosis at L3-L4 level due to combination of disc  bulge and ligamentum flavum thickening.  Partially visualized left pleural effusion. Fatty liver. There are  gallstones.      Impression:         1.Compared to prior exam of 01/20/2021, there is new moderate compression  deformity of T12 vertebral body with associated bony fragmentation.  Although this is new from the prior exam of 2022, this is probably a  chronic fracture. Recommend correlation with history and examination.  2.Redemonstrated severe T11, moderate L1, L2, L3 compression deformities.  3.Disc bulge and ligamentum flavum thickening resulting in severe spinal  canal stenosis at L3-L4 level.  4.3-mm anterolisthesis of L3 on L4.  5.Diffuse bony demineralization.  6.Partially visualized left pleural effusion.    Vara Guardian, MD  01/13/2023 9:59 PM            Lines  Patient  Lines/Drains/Airways Status       Active PICC Line / CVC Line / PIV Line / Drain / Airway / Intraosseous Line / Epidural Line / ART Line / Line / Wound / Pressure Ulcer / NG/OG Tube       Name Placement date Placement time Site Days    Peripheral IV 01/13/23 18 G Standard Left Antecubital 01/13/23  1903  Antecubital  less than 1    Wound 05/25/19 Surgical Incision Eye Left eye pads, paper tape 05/25/19  1116  Eye  1329                           Signed by: Frederich Cha, DO, MD,  Cc    I have spent 50 minutes with the patient on counseling, chart review, and coordinating care

## 2023-01-15 ENCOUNTER — Observation Stay: Payer: BLUE CROSS/BLUE SHIELD

## 2023-01-15 ENCOUNTER — Other Ambulatory Visit: Payer: Self-pay

## 2023-01-15 DIAGNOSIS — S064XAA Epidural hemorrhage with loss of consciousness status unknown, initial encounter: Secondary | ICD-10-CM

## 2023-01-15 DIAGNOSIS — M4807 Spinal stenosis, lumbosacral region: Secondary | ICD-10-CM

## 2023-01-15 DIAGNOSIS — S22070A Wedge compression fracture of T9-T10 vertebra, initial encounter for closed fracture: Secondary | ICD-10-CM

## 2023-01-15 DIAGNOSIS — S22071A Stable burst fracture of T9-T10 vertebra, initial encounter for closed fracture: Secondary | ICD-10-CM

## 2023-01-15 DIAGNOSIS — Z86718 Personal history of other venous thrombosis and embolism: Secondary | ICD-10-CM

## 2023-01-15 DIAGNOSIS — S22088A Other fracture of T11-T12 vertebra, initial encounter for closed fracture: Secondary | ICD-10-CM

## 2023-01-15 DIAGNOSIS — G9519 Other vascular myelopathies: Secondary | ICD-10-CM

## 2023-01-15 LAB — BASIC METABOLIC PANEL
Anion Gap: 7 (ref 5.0–15.0)
BUN: 9 mg/dL (ref 7–21)
CO2: 23 mEq/L (ref 17–29)
Calcium: 8.3 mg/dL — ABNORMAL LOW (ref 8.5–10.5)
Chloride: 107 mEq/L (ref 99–111)
Creatinine: 0.7 mg/dL (ref 0.4–1.0)
GFR: >60 mL/min/{1.73_m2} (ref >=60.0)
Glucose: 81 mg/dL (ref 70–100)
Potassium: 3.9 mEq/L (ref 3.5–5.3)
Sodium: 137 mEq/L (ref 135–145)

## 2023-01-15 LAB — CBC
Absolute nRBC: 0 10*3/uL (ref <=0.00)
Hematocrit: 36.8 % (ref 34.7–43.7)
Hemoglobin: 12.4 g/dL (ref 11.4–14.8)
MCH: 34.9 pg — ABNORMAL HIGH (ref 25.1–33.5)
MCHC: 33.7 g/dL (ref 31.5–35.8)
MCV: 103.7 fL — ABNORMAL HIGH (ref 78.0–96.0)
MPV: 9.9 fL (ref 8.9–12.5)
Platelet Count: 282 10*3/uL (ref 142–346)
RBC: 3.55 10*6/uL — ABNORMAL LOW (ref 3.90–5.10)
RDW: 16 % — ABNORMAL HIGH (ref 11–15)
WBC: 7.83 10*3/uL (ref 3.10–9.50)
nRBC %: 0 /100 WBC (ref <=0.0)

## 2023-01-15 NOTE — Plan of Care (Addendum)
NURSING PROGRESS NOTE    [name] is a 62 y.o. female  Admitted 01/13/2023  8:43 PM [facility] day 0)    Indication for Neuro [facility] Status:   Close monitoring.      Major Shift Events:   Xray with TLSO brace completed.   OT eval completed.    Downgrade orders placed.   Q4 neuro checks completed.  PRN tylenol x1 given for lowerback pain with good effect. Scheduled meds given with good effect.  Dopplers BLE pending.     Precautions in place. AVASYS at bedside.    Review of Systems  Neuro:  A/Ox4, FC, FS, MAE 4/5. Pupils 3RBE.    Cardiac:  NSR, PP. +1 RLE edema. +2 LLE edema. Afebrile.    Respiratory:  RA, dim LS.     GI/GU:  Regular diet, thin liquids, pills taken whole.   X1-2 assist OOB. OT recommending use of BSC. Pt requires repeated education on use of walker and brace when OOB stating "I feel like I'm going to fall" when using walker.    Skin:  See flowsheets.     BM this shift? Y    Recent Labs   Lab 01/15/23  0328   Sodium 137   Potassium 3.9   Chloride 107   CO2 23   BUN 9   Creatinine 0.7   GFR >60.0   Glucose 81   Calcium 8.3*       Recent Labs   Lab 01/15/23  0328   WBC 7.83   Hemoglobin 12.4   Hematocrit 36.8   Platelet Count 282       LDAs  Patient Lines/Drains/Airways Status       Active Lines, Drains and Airways       Name Placement date Placement time Site Days    Peripheral IV 01/15/23 20 G Standard Anterior;Right Forearm 01/15/23  0330  Forearm  less than 1                    Indication for Central Access and estimated target removal date?  N/a    Indication for Foley and estimated target removal date?  N/a    Safety Checklist    Fall Precautions y    Carlena Sax y    Seizure Precautions n    Aspiration Precautions n   Belongings Checked y     Financial controller  Medication Teaching y   Purposeful Hourly Rounding y       Equities trader Services:  Does the patient require an Interpreter? n       Problem: Moderate/High Fall Risk Score >5  Goal: Patient will remain free of falls  Outcome:  Progressing  Flowsheets (Taken 01/15/2023 0730)  High (Greater than 13):   HIGH-Utilize chair pad alarm for patient while in the chair   HIGH-Apply yellow "Fall Risk" arm band   HIGH-Initiate use of floor mats as appropriate     Problem: Pain interferes with ability to perform ADL  Goal: Pain at adequate level as identified by patient  Outcome: Progressing  Flowsheets (Taken 01/15/2023 0750)  Pain at adequate level as identified by patient:   Assess pain on admission, during daily assessment and/or before any "as needed" intervention(s)   Reassess pain within 30-60 minutes of any procedure/intervention, per Pain Assessment, Intervention, Reassessment (AIR) Cycle   Evaluate if patient comfort function goal is met   Identify patient comfort function goal   Consult/collaborate with Physical Therapy, Occupational Therapy, and/or Speech Therapy  Offer non-pharmacological pain management interventions   Assess for risk of opioid induced respiratory depression, including snoring/sleep apnea. Alert healthcare team of risk factors identified.     Problem: Side Effects from Pain Analgesia  Goal: Patient will experience minimal side effects of analgesic therapy  Outcome: Progressing  Flowsheets (Taken 01/15/2023 0750)  Patient will experience minimal side effects of analgesic therapy:   Monitor/assess patient's respiratory status (RR depth, effort, breath sounds)   Assess for changes in cognitive function   Prevent/manage side effects per LIP orders (i.e. nausea, vomiting, pruritus, constipation, urinary retention, etc.)   Evaluate for opioid-induced sedation with appropriate assessment tool (i.e. POSS)     Problem: Impaired Mobility  Goal: Mobility/Activity is maintained at optimal level for patient  Outcome: Progressing  Flowsheets (Taken 01/15/2023 0750)  Mobility/activity is maintained at optimal level for patient:   Encourage independent activity per ability   Consult/collaborate with Physical Therapy and/or Occupational  Therapy     Problem: Peripheral Neurovascular Impairment  Goal: Extremity color, movement, sensation are maintained or improved  Outcome: Progressing  Flowsheets (Taken 01/15/2023 0750)  Extremity color, movement, sensation are maintained or improved:   Assess and monitor application of corrective devices (cast, brace, splint), check skin integrity   Assess extremity for proper alignment     Problem: Neurological Deficit  Goal: Neurological status is stable or improving  Flowsheets (Taken 01/15/2023 0751)  Neurological status is stable or improving:   Monitor/assess/document neurological assessment (Stroke: every 4 hours)   Perform CAM Assessment

## 2023-01-15 NOTE — Progress Notes (Signed)
NEUROSURGERY ICU PROGRESS NOTE    Date Time: 01/15/23 4:44 AM  Patient Name: [name]  Consulting Attending Physician: Dr. Chauncy Lean  Covered By: Team B: [phone]    Assessment:   [name] is a 61 y.o. 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    Plan:   - Primary medical management per CNS  - q4h neurochecks  - Drains:   - No surgical drain  - Imaging:  - UXR T-spine  - Stat CTH for neuro decline.  - Activity/Positioning:  - TLSO brace when OOB   - May don doff at edge of bed  - HOB >30 degrees  - Medications:  - Multimodal pain control with QD bowel regimen.  - No therapeutic anticoagulants/antiplatelets/NSAIDs   - Ok to start Xarelto from neurosurgical perspective 7/30  - Consults:  - PT/OT eval and treat  - DVT Prophylaxis:  - SCDs  - Chemo DVT PPx: per primary team.  - Wound Care:  - Local wound care PRN    No indication for acute neurosurgical intervention at this time.  Please call with further questions, neuro decline, or new neuroimaging requiring our review.  Please follow up in 6 weeks in outpatient neurosurgery clinic with Tawni Millers NP.    Plan formulated and discussed with Dr. Chauncy Lean.     Interim History:     24h Events:   - NAEO  - AFVSS  - AUOP, voiding spontaneously  - Denies any back pain today; endorsing GLF 1 week PTA  - MRI L-sp: completed, per below  Last Na:137    Physical Exam:     Vitals:    01/15/23 0000   BP: 116/73   Pulse:    Resp:    Temp: 97.4 F (36.3 C)   SpO2: 98%       Intake and Output Summary (Last 24 hours) at Date Time    Intake/Output Summary (Last 24 hours) at 01/15/2023 0444  Last data filed at 01/14/2023 1658  Gross per 24 hour   Intake 120 ml   Output 0 ml   Net 120 ml       General Appearance: NAD  HEENT: Atraumatic.   CV: Hemodynamically stable, appears well-perfused  Pulm:  Normal effort, equal chest rise.  GI: Soft.     NEURO:    Mental Status: A&Ox4. Able to fully participate in examination. Fluent, spontaneous speech. Comprehension intact. Follows commands with midline cross. Attends to interviewer.     Cranial Nerves: II-XII grossly intact and symmetric bilaterally.     Motor:  Normal bulk and tone throughout. No abnormal movements.  Pronator Drift: none    UE  Deltoid (C5) Bicep (C5)  Tricep  (C7) WE (C6) Grip (C8) IO   (T1)   R  5/5 5/5 5/5 5/5 5/5 5/5   L 5/5 5/5 5/5 5/5 5/5 5/5     LE  Hip F  (L2) Knee E (L3) DF (L4)  EHL (L5) PF  (S1)    R  5/5 5/5 5/5 5/5 5/5   L 5/5 5/5 4+/5 4+/5 5/5     Sensory: Sensation intact to LT throughout.     Cerebellar: Not tested    Gait: Deferred.      Medications:     Current Facility-Administered  Medications   Medication Dose Route Frequency    lidocaine  1 patch Transdermal Q24H    methocarbamol  500 mg Oral QID    metoprolol tartrate  50 mg Oral Q12H SCH    pantoprazole  40 mg Oral Daily    timolol  1 drop Left Eye Daily       Labs:     Lab Results   Component Value Date    WBC 7.83 01/15/2023    HGB 12.4 01/15/2023    HCT 36.8 01/15/2023    MCV 103.7 (H) 01/15/2023    PLT 282 01/15/2023     Lab Results   Component Value Date    NA 137 01/15/2023    K 3.9 01/15/2023    CL 107 01/15/2023    CO2 23 01/15/2023     Lab Results   Component Value Date    INR 1.0 01/14/2023    INR 1.4 (H) 01/02/2011    INR 1.1 01/01/2011    PT 10.8 01/14/2023    PT 16.7 (H) 01/02/2011    PT 13.7 01/01/2011       Rads:     Radiology Results (24 Hour)       Procedure Component Value Units Date/Time    MRI Lumbar Spine W WO Contrast [[id number]] Collected: 01/14/23 0936    Order Status: Completed Updated: 01/14/23 1003    Narrative:      HISTORY: weakness in lower legs, on xaralto,.     COMPARISON: 01/13/2023 CT lumbar spine.    TECHNIQUE: MRI of the lumbar spine performed on a Tesla scanner without and  with intravenous contrast.      CONTRAST: gadoterate meglumine  (CLARISCAN) 7.5 MMOL/15ML injection 10.4 mL    FINDINGS:   Limited imaging of the thoracic vertebrae shows T10 vertebral body superior  endplate incomplete incomplete burst fracture, with 2 mm retropulsion, with  marrow edema consistent with recent likely acute fracture.    Focal abnormal fluid collection measuring 6 mm centered at T12 vertebrae  extending from mid inferior T11 to mid L1 level indenting the anterior  thecal sac.displace the thecal sac likely representing small hematoma.     T11 vertebral body subacute compression fracture, unchanged in alignment.  There is kyphotic deformity, with 3 mm anterolisthesis of T10 on T11.    T12 vertebral body superior endplate subacute compression fracture,  unchanged alignment.    Old superior endplate L1 L3 compression fractures, unchanged. Disc  osteophyte and broad annular bulge of L3-L4 with bilateral facet  degeneration causing degree of central canal stenosis.          Impression:        1.T10 vertebral body superior endplate incomplete burst fracture, with 2 mm  retropulsion, with marrow edema consistent with recent likely acute  fracture.  2.Focal abnormal fluid collection measuring 6 mm centered at T12 vertebrae  extending from mid inferior T11 to mid L1 level indenting the anterior  thecal sac displacing the thecal sac nonspecific but likely representing  small hematoma.   3.T11 vertebral body subacute compression fracture, unchanged in alignment.    4.T12 vertebral body superior endplate subacute compression fracture,  unchanged alignment.  5.Old superior endplate L1 L3 compression fractures, unchanged.     These urgent findings discussed with and acknowledged by Dr. Frederich Cha at  01/14/2023 9:47 AM.    Trinna Post Einar Pheasant, MD  01/14/2023 10:01 AM            Hezzie Bump, MD  Neurosurgery    PGY-3  01/15/23 4:45 AM

## 2023-01-15 NOTE — Plan of Care (Signed)
Discussed with Radiologist Venous Doppler results: extensive occlusive thrombus in LCF, saphenous, femoral, popliteal vein on L.    NSGY notes reviewed re: initiation of ANY anticoagulation 7/30 given epidural hematoma.      I discussed with Dr. Cyndie Chime: re placement of temp IVC filter (veins proximal to Femoral V without thrombus).  IR (Dr. Bonita Quin) contacted and patient scheduled for am filter placement.  In the interim;    NPO p MN  SCDs d/ced  Strict bedrest    Pt on RA 199-100%, pulse 81-92 (no tachycardia) and in NAD.

## 2023-01-15 NOTE — Consults (Cosign Needed Addendum)
HEMATOLOGY ONCOLOGY     Customer service manager [facility]  Physician Line: [phone]  Hours: 8:00-5:00 Monday-Friday  Inpatient Malignant Hematology APP [employer]: [phone]  Inpatient Solid Tumor APP [employer]: [phone]  Inpatient Benign Hematology APP [employer]: [phone]  *After Hours: [phone] (main) or [phone] (physicians only)  *Preferred method for contact at night    HEMATOLOGY/ONCOLOGY CONSULTATION    Date Time: 01/15/23 4:38 PM  Patient Name: [name]  Requesting Physician: Frederich Cha, DO      Reason for Consultation:     Does she need [facility]?    Assessment:     62 year old female with past medical history of DVT currently on Xarelto who presented to the hospital after sustaining a fall resulting in lower extremity weakness and urinary incontinence.  She had imaging done which showed acute T10 vertebral fracture with the limited retropulsion and focal fluid collection measuring 6 mm extending from the inferior T11 to mid L1 causing indentation of the anterior thecal sac likely from a epidural small hematoma.  She also had a subacute T11 vertebral fracture and subacute T12 vertebral fracture.    Based on chart review as well as talking with patient's primary care provider Dr. Deneen Harts in 2012 she had Left posterior tibial vein DVT which was thought to be provoked due to her use of OC pills.  During the same hospitalization she was diagnosed with acute colitis.  Eventually she was discharged on Lovenox Coumadin bridge.  Due to concern for noncompliance with treatment INR check he was switched to Xarelto.  As per PCP when she was weaned off anticoagulation she developed another DVT resulting in her being on anticoagulation again.  It was unclear if second DVT was in setting of her being off anticoagulation or noncompliance.  Irrespectively PCP had tested her for protein C, protein S, Antithrombin III, lupus, factor V Leyden, prothrombin gene mutation which  were noncontributory.  With this history considering she had recurrent DVT and negative thrombophilia workup she will be at increased risk for recurrence of clot.  Neurosurgery has cleared patient to go back on anticoagulation from 12/23/2022.    She follows up with Dr. Daisey Must who is managing her [facility] but lately as per PCP patient has not come for office visits.    Venous Dopplers as below    September 2013 no sonographic evidence in the left lower extremity  January 2015 no sonographic evidence of DVT in left lower extremity   June 2021 no sonographic evidence of DVT left lower extremity  October 2021 no evidence of DVT bilaterally.    Plan:     Venous Dopplers to assess for DVT considering she has not been on [facility] for a few days prior to admission.  If no DVT found considering she is at increased risk for recurrent clot she needs to go back to Xarelto 10 mg daily  If DVT found we may have to consider temporary IVC filter and start her on therapeutic AC from 01/19/2023.  Monitor CBC  Monitor for signs and symptoms of bleeding.  Discussed about fall risk precautions  Continue age-appropriate cancer screening    History:     This is a 62 year old female with past medical history of DVT currently on Xarelto who presented to the hospital after sustaining a fall resulting in lower extremity weakness and urinary incontinence.  She had imaging done which showed acute T10 vertebral fracture with the limited retropulsion and focal fluid collection measuring 6  mm extending from the inferior T11 to mid L1 causing indentation of the anterior thecal sac likely from a epidural small hematoma.  She also had a subacute T11 vertebral fracture and subacute T12 vertebral fracture.  Hematology consult is been requested for further restratification about her clotting risk and need for continuing anticoagulation in light of her epidural hemorrhage.    At the time of my relation patient was resting in bed.  She reports being on Xarelto  for DVT but last dose was few days back prior to the admission as she ran out of it.  Currently she has back pain which is controlled.    She follows up with Dr. Daisey Must who is managing her [facility].  She denies any history of DVT/PE in family.  Husband with esophageal cancer.  She is up-to-date with her age-appropriate cancer screening  Colonoscopy, Pap smear, mammo in the last 1 year which were normal as per her.  Ex smoker.  Smoked for 10 years 1 pack/day quit many many years back.  Social alcohol use.    Past Medical History:     Past Medical History:   Diagnosis Date    Chronic ulcerative colitis     nio issues for long time    Claustrophobia     Clotting disorder     DVT LLE 2012    Deep venous thrombosis of calf 2012    LLL/on xeralto//    GERD (gastroesophageal reflux disease)     Glaucoma NEC     bilat    Hypertensive disorder     controlled well with medication    Ovarian cyst, right     Plantar fasciitis     Seasonal allergies        Oncology History:       Oncology History    No history exists.       Past Surgical History:     Past Surgical History:   Procedure Laterality Date    BUNIONECTOMY      left foot    CATARACT EXTRACTION      EYE SURGERY Bilateral 2013    laser surgery for glaucoma, bilateral trabeculectomy    GLAUCOMA SURGERY      HYSTERECTOMY      removed both ovaries and tubes    revision of hysterectomyl suture line      REVISION, BLEB, SCLERAL PATCH GRAFT Left 05/25/2019    Procedure: REVISION, BLEB;  Surgeon: Lamount Cranker, MD;  Location: [place] MAIN OR;  Service: Ophthalmology;  Laterality: Left;    TONSILLECTOMY      TRABECULECTOMY Left 03/22/2014    Procedure: TRABECULECTOMY;  Surgeon: Pecola Leisure, MD;  Location: [place] MA[facility] OR;  Service: Ophthalmology;  Laterality: Left;  TRABECULECTOMY REVISION, LEFT EYE  Q1=N  ANES=MAC  EQUIP=MITOMYCIN C, 0.4mg /cc, DISPENSE 5cc (ORDER TO FOLLOW)  MD REQ 30 MIN  ALLERGIES=COSOPT, AZOPT, ALPHAGAN  **Need RESIGHT SET from SPD    ureterectomy  left         Family History:     Family History   Problem Relation Age of Onset    Hypertension Father     Vision loss Father     Vision loss Brother     Hypertension Paternal Aunt     Vision loss Paternal Uncle        Social History:     Social History     Socioeconomic History    Marital status: Married     Spouse  name: Not on file    Number of children: Not on file    Years of education: Not on file    Highest education level: Not on file   Occupational History    Not on file   Tobacco Use    Smoking status: Former     Current packs/day: 0.00     Types: Cigarettes     Quit date: 12/21/2010     Years since quitting: 12.0    Smokeless tobacco: Never   Vaping Use    Vaping status: Never Used   Substance and Sexual Activity    Alcohol use: Yes     Alcohol/week: 1.0 standard drink of alcohol     Types: 1 Cans of beer per week     Comment: 1/week    Drug use: No    Sexual activity: Yes     Partners: Male     Birth control/protection: Surgical   Other Topics Concern    Not on file   Social History Narrative    Not on file     Social Determinants of Health     Financial Resource Strain: Low Risk  (01/14/2023)    Overall Financial Resource Strain (CARDIA)     Difficulty of Paying Living Expenses: Not hard at all   Food Insecurity: No Food Insecurity (01/13/2023)    Hunger Vital Sign     Worried About Running Out of Food in the Last Year: Never true     Ran Out of Food in the Last Year: Never true   Transportation Needs: No Transportation Needs (01/14/2023)    PRAPARE - Therapist, art (Medical): No     Lack of Transportation (Non-Medical): No   Physical Activity: Not on file   Stress: Not on file   Social Connections: Not on file   Intimate Partner Violence: Not At Risk (01/13/2023)    Humiliation, Afraid, Rape, and Kick questionnaire     Fear of Current or Ex-Partner: No     Emotionally Abused: No     Physically Abused: No     Sexually Abused: No   Housing Stability: Low Risk  (01/14/2023)    Housing  Stability Vital Sign     Unable to Pay for Housing in the Last Year: No     Number of Times Moved in the Last Year: 1     Homeless in the Last Year: No       Allergies:   Allergies[1]    Medications:     Current Facility-Administered Medications   Medication Dose Route Frequency    lidocaine  1 patch Transdermal Q24H    methocarbamol  500 mg Oral QID    metoprolol tartrate  50 mg Oral Q12H SCH    pantoprazole  40 mg Oral Daily    timolol  1 drop Left Eye Daily       Review of Systems:   A comprehensive review of systems was: negative    Physical Exam:     Vitals:    01/15/23 1600   BP: 107/77   Pulse: 81   Resp: 20   Temp:    SpO2:        Intake and Output Summary (Last 24 hours) at Date Time    Intake/Output Summary (Last 24 hours) at 01/15/2023 1638  Last data filed at 01/14/2023 1658  Gross per 24 hour   Intake --   Output 0 ml   Net 0  ml       General: Well developed, well nourished, in no apparent distress  Head: Atraumatic, normocephalic  Eyes: PERRLA, EOMI  Throat: Mucous membranes moist, oropharynx clear  Neck: Supple, no lymphadenopathy, no thyromegaly  Cardiovascular: Normal rate, regular rhythm, no gallops/rubs/murmurs, S1S2 normal  Respiratory: CTAB, non-labored respirations, no wheezes, no rales  Abdomen: SNTND, bowel sounds positive  Extremities: Appropriately warm, no edema  Neurologic: Alert and oriented x 3, no focal neurologic deficits      Labs Reviewed:     Results       Procedure Component Value Units Date/Time    Basic Metabolic Panel [[id number]]  (Abnormal) Collected: 01/15/23 0328    Specimen: Blood, Venous Updated: 01/15/23 0441     Glucose 81 mg/dL      BUN 9 mg/dL      Creatinine 0.7 mg/dL      Calcium 8.3 mg/dL      Sodium 660 mEq/L      Potassium 3.9 mEq/L      Chloride 107 mEq/L      CO2 23 mEq/L      Anion Gap 7.0     GFR >60.0 mL/min/1.73 m2     CBC without Differential [[id number]]  (Abnormal) Collected: 01/15/23 0328    Specimen: Blood, Venous Updated: 01/15/23 0417     WBC 7.83 x10  3/uL      Hemoglobin 12.4 g/dL      Hematocrit 32.3 %      Platelet Count 282 x10 3/uL      MPV 9.9 fL      RBC 3.55 x10 6/uL      MCV 103.7 fL      MCH 34.9 pg      MCHC 33.7 g/dL      RDW 16 %      nRBC % 0.0 /100 WBC      Absolute nRBC 0.00 x10 3/uL             Rads:   Radiological Procedure reviewed.       Signed by:     Genella Rife, MD  Hematology/Oncology Fellow   [facility]  [address]  [place], [place] [address]  T:  [phone]  E:  [email or web address]                          [1]   Allergies  Allergen Reactions    Latex Itching

## 2023-01-15 NOTE — OT Eval Note (Addendum)
Occupational Therapy Eval [name]        Post Acute Care Therapy Recommendations:     Discharge Recommendations:  Acute Rehab    If Acute Rehab  recommended discharge disposition is not available, patient will need hands on assist for functional mobility and ADLs and HHOT, home health aide, 1st floor set-up, skilled transport in/out of the home, and family training.     DME needs IF patient is discharging home: Front wheel walker, BSC, Wheelchair-manual, Reacher, Dressing stick, Long-handled sponge, Sock aid, Long-handled shoehorn, Shower chair    Therapy discharge recommendations may change with patient status.  Please refer to most recent note for up-to-date recommendations.    Patient anticipated to benefit from and to be able to engage in 3 hours of therapy a day for 5 days a week.    Assessment:   Significant Findings:  Asymptomatic decline in BP with change in position:    Semi-supine in bed: BP: 121/84  Sitting: BP: 123/83   Standing: BP: 100/70  Standing after mobility: BP: 103/76    [name] is a 62 y.o. female admitted 01/13/2023 s/p low back pain and LE pain.  Patient presents with pain, decreased activity tolerance, decreased dynamic balance, decreased strength, bilateral LE edema, and decreased knowledge of spinal precautions/TLSO management, which impacts pt's overall safety and independence with functional mobility, ADLs, and IADLs. Pt is currently completing bed mobility with Min-Mod A, short distance mobility with RW with CGA-Min A, and ADLs with Mod-Max A. During mobility, pt required max cues for RW management and note short, shuffled gait. Provided initial education on spinal precautions, log roll technique, and donning/doffing TLSO. Pt verbalizing good initial understanding, but will require continued reinforcement. Pt is functioning below PLOF and will benefit from continued acute OT services to maximize independence and safety with ADLs and functional transfers/mobility.         Impairments: Assessment: decreased ROM;decreased strength;balance deficits;decreased independence with ADLs;decreased safety awareness;decreased independence with IADLs;decreased endurance/activity tolerance    Therapy Diagnosis: Decreased activity tolerance    Rehabilitation Potential: Prognosis: Good;With continued OT s/p acute discharge;With family     Treatment Activities: Evaluation, pt education, functional t/f training   Educated the patient to role of occupational therapy, plan of care, goals of therapy and safety with mobility and ADLs, home safety.    Plan:   OT Frequency Recommended: 3-4x/wk     Treatment Interventions: ADL retraining;Functional transfer training;UE strengthening/ROM;Endurance training;Cognitive reorientation;Patient/Family training;Equipment eval/education;Fine motor coordination activities;Compensatory technique education;Continued evaluation;Neuro muscular reeducation     Risks/benefits/POC discussed with patient       Unit: [facility] TOWER 7  Bed: F726/F726.01         Precautions and Contraindications:   Precautions  Weight Bearing Status: no restrictions  Back Brace Applied: yes, OOB (TLSO - pt may donn/doff EOB)  Spinal Precautions: no bending, no twisting, no lifting  Other Precautions: Falls      Consult received for [name] for OT Evaluation and Treatment.  Patient's medical condition is appropriate for Occupational Therapy intervention at this time.    Admitting Diagnosis: Spinal stenosis of lumbosacral region [M48.07]      History of Present Illness:    [name] is a 62 y.o. female admitted on 01/13/2023 with " a history of deep vein thrombosis (DVT) in 2012, prior ulcerative colitis, and hypertension who presents with worsening low back pain and bilateral leg pain. The patient reports that the pain started recently,  about a month ago, and has significantly worsened over the last few days, prompting her to seek medical attention. The  pain is localized to the middle to lower back and radiates to the front of both legs, reaching down to the knees.     The pain worsens when standing for long periods and improves when lying flat or leaning forward on something like a shopping cart. She denies any difficulties with fine motor movements, pain in the upper extremities, numbness, or tingling. There is no numbness in the buttocks, but she does report some urinary urge incontinence, which has been more recent, starting a few months ago. She denies any bowel incontinence, numbness, or tingling in the legs." -Per chart review       Past Medical/Surgical History:  Medical History[1]     Past Surgical History:   Procedure Laterality Date    BUNIONECTOMY      left foot    CATARACT EXTRACTION      EYE SURGERY Bilateral 2013    laser surgery for glaucoma, bilateral trabeculectomy    GLAUCOMA SURGERY      HYSTERECTOMY      removed both ovaries and tubes    revision of hysterectomyl suture line      REVISION, BLEB, SCLERAL PATCH GRAFT Left 05/25/2019    Procedure: REVISION, BLEB;  Surgeon: Lamount Cranker, MD;  Location: [facility] MAIN OR;  Service: Ophthalmology;  Laterality: Left;    TONSILLECTOMY      TRABECULECTOMY Left 03/22/2014    Procedure: TRABECULECTOMY;  Surgeon: Pecola Leisure, MD;  Location: [facility] OR;  Service: Ophthalmology;  Laterality: Left;  TRABECULECTOMY REVISION, LEFT EYE  Q1=N  ANES=MAC  EQUIP=MITOMYCIN C, 0.4mg /cc, DISPENSE 5cc (ORDER TO FOLLOW)  MD REQ 30 MIN  ALLERGIES=COSOPT, AZOPT, ALPHAGAN  **Need RESIGHT SET from SPD    ureterectomy left          Imaging/Tests/Labs:  XR Thoracic Spine AP And Lateral    Result Date: 01/15/2023  Limited visualization. Multilevel compression fractures. Melody Haver, MD 01/15/2023 10:59 AM    MRI Lumbar Spine W WO Contrast    Result Date: 01/14/2023  1.T10 vertebral body superior endplate incomplete burst fracture, with 2 mm retropulsion, with marrow edema consistent with recent likely acute  fracture. 2.Focal abnormal fluid collection measuring 6 mm centered at T12 vertebrae extending from mid inferior T11 to mid L1 level indenting the anterior thecal sac displacing the thecal sac nonspecific but likely representing small hematoma. 3.T11 vertebral body subacute compression fracture, unchanged in alignment. 4.T12 vertebral body superior endplate subacute compression fracture, unchanged alignment. 5.Old superior endplate L1 L3 compression fractures, unchanged. These urgent findings discussed with and acknowledged by Dr. Frederich Cha at 01/14/2023 9:47 AM. Trinna Post Einar Pheasant, MD 01/14/2023 10:01 AM    CT Lumbar Spine without Contrast    Result Date: 01/13/2023   1.Compared to prior exam of 01/20/2021, there is new moderate compression deformity of T12 vertebral body with associated bony fragmentation. Although this is new from the prior exam of 2022, this is probably a chronic fracture. Recommend correlation with history and examination. 2.Redemonstrated severe T11, moderate L1, L2, L3 compression deformities. 3.Disc bulge and ligamentum flavum thickening resulting in severe spinal canal stenosis at L3-L4 level. 4.3-mm anterolisthesis of L3 on L4. 5.Diffuse bony demineralization. 6.Partially visualized left pleural effusion. 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      Subjective:   Subjective: "My walking is different." Patient is agreeable to participation in the therapy session. Nursing clears patient for therapy.     Patient Goal: "I would like to do more for myself."    Pain:  Pain Assessment  Pain Assessment: Numeric Scale (0-10)  Pain Score: 5-moderate pain  Pain Location: Back  Pain Orientation: Lower  Pain Descriptors: Aching  Pain Intervention(s): Repositioned;Rest (RN administered medications prior to the OT session)    Objective:   Observation of Patient/Vital Signs:  Patient is in bed with [employer] monitors and peripheral IV in place.  Pt wore mask during therapy session:No      Cognitive Status and Neuro Exam:  Cognition/Neuro Status  Arousal/Alertness: Delayed responses to stimuli  Attention Span: Appears intact  Orientation Level: Oriented X4  Memory: Decreased short term memory (pt able to recall 3/3 items, but note pt asking the same question during the evaluation)  Following Commands: minimal verbal instruction  Safety Awareness: minimal verbal instruction  Insights: Decreased awareness of deficits  Problem Solving: minimal assistance  Behavior: calm;cooperative  Coordination:  (Increased time required for GMC/FMC)  Hand Dominance: right handed    Musculoskeletal Examination  Gross ROM  Right Upper Extremity ROM: within functional limits  Left Upper Extremity ROM: within functional limits    Gross Strength  Right Upper Extremity Strength: 4/5 (based on functional observation; not formally assess due to spinal precautions)  Left Upper Extremity Strength: 4/5 (based on functional observation; not formally assess due to spinal precautions)    Note bilateral LE edema (grossly 2+ right more impaired than left)    Sensory/Oculomotor Examination  Sensory  Auditory: intact  Tactile - Light Touch: intact (Pt able to  localize light touch (-) extinction)    Vision - Complex Assessment  Additional Comments: Pt wears glasses- pt able to see near/far. No overt impairments noted- recommend continued monitoring.    Activities of Daily Living  Self-care and Home Management  Eating: Independent  Grooming: Contact Guard Assist;Minimal Assist (standing at the sink)  Bathing: Maximal Assist (seated, simulated)  UB Dressing: Moderate Assist (assist for donning TLSO)  LB Dressing: Maximal Assist  Toileting: Moderate Assist;Maximal Assist  Functional Transfers: Contact Guard Assist;Minimal Assist    Functional Mobility:  Mobility and Transfers  Supine to Sit: Minimal Assist (log roll; assist for trunk)  Sit to Supine: Moderate Assist (log roll; assist for LE)  Sit to Stand: Contact Guard Assist;Minimal Assist  Functional Mobility/Ambulation: Contact Guard Assist;Minimal Assist (RW; max cues for RW management; note short, shuffled gait)     PMP Activity: Step 6 - Walks in Room     Balance  Balance  Static Sitting Balance: Supervision  Static Standing Balance: Contact Guard Assist;Minimal Assist    Participation and Activity Tolerance  Participation  and Endurance  Participation Effort: good  Endurance: Tolerates 10 - 20 min exercise with multiple rests    Patient left with call bell within reach, all needs met, SCDs off as found, fall mat in place, bed alarm on, chair alarm N/A and all questions answered. RN notified of session outcome and patient response.     Goals:  Time For Goal Achievement: 10 visits  ADL Goals  Patient will groom self: Supervision, at sinkside  Patient will dress lower body: Stand by Assist, with AE  Patient will toilet: Stand by Assist  Mobility and Transfer Goals  Pt will perform functional transfers: Stand by Assist, with rolling walker  Neuro Re-Ed Goals  Pt will perform dynamic standing balance: for 10 minutes, Stand by Assist  Executive Fucntion Goals  Pt will follow spine precautions: Recall 3/3, to increase  ability to complete ADLs, with supervision    PPE worn during session: procedural mask and gloves    Tech present: no  PPE worn by tech: N/A    Raliegh Ip, OTR/L   Pager #: [phone]        Time of Treatment:   OT Received On: 01/15/23  Start Time: 1440  Stop Time: 1515  Time Calculation (min): 35 min                  [1]   Past Medical History:  Diagnosis Date    Chronic ulcerative colitis     nio issues for long time    Claustrophobia     Clotting disorder     DVT LLE 2012    Deep venous thrombosis of calf 2012    LLL/on xeralto//    GERD (gastroesophageal reflux disease)     Glaucoma NEC     bilat    Hypertensive disorder     controlled well with medication    Ovarian cyst, right     Plantar fasciitis     Seasonal allergies

## 2023-01-15 NOTE — Progress Notes (Signed)
NURSING PROGRESS NOTE    [name] is a 62 y.o. female  Admitted 01/13/2023  8:43 PM Community [facility] day 0)    Indication for Neuro [facility] Status:   Close Monitoring    Major Shift Events:   Up w/TLSO brace to bedside commode  Educated on use of brace when out of bed  Potassium 3.9, replaced per order        Review of Systems  Neuro:  A&O x 3  Follows commands  Pupils RBR  Full sensation   4/5 strength all extremities    Cardiac:  NSR  Afebrile  Pulses palpable  Edema to BLE    Respiratory:  Room air    GI/GU:  Regular diet/ thin liquids  Fluid intake encouraged  Continent x 2   Bedside commode    Skin:  See flowsheet    BM this shift? n    Recent Labs   Lab 01/15/23  0328   Sodium 137   Potassium 3.9   Chloride 107   CO2 23   BUN 9   Creatinine 0.7   GFR >60.0   Glucose 81   Calcium 8.3*       Recent Labs   Lab 01/15/23  0328   WBC 7.83   Hemoglobin 12.4   Hematocrit 36.8   Platelet Count 282       LDAs  Patient Lines/Drains/Airways Status       Active Lines, Drains and Airways       Name Placement date Placement time Site Days    Peripheral IV 01/14/23 22 G Diffusion Anterior;Right Forearm 01/14/23  0900  Forearm  less than 1                    Indication for Central Access and estimated target removal date?  na    Indication for Foley and estimated target removal date?  na    Safety Checklist    Fall Precautions y    Avasys n    Seizure Precautions y    Aspiration Precautions y   Belongings Checked y     Financial controller  Medication Teaching y   Purposeful Hourly Rounding y       Equities trader Services:  Does the patient require an Interpreter? no

## 2023-01-15 NOTE — PT Progress Note (Signed)
[facility]   Physical Therapy Cancellation Note      Patient:  [name] MRN#:  [id number]  Unit:  [facility] TOWER 7 Room/Bed:  F726/F726.01    01/15/2023  Time: 5:42 PM   PT Cancellation: Visit  PT Visit Cancellation Reason: Other (comment required) (awaiting LE doppler to r/o dvt. Will f/u.)   Georgeanne Nim PT# [phone]

## 2023-01-15 NOTE — Plan of Care (Signed)
Problem: Moderate/High Fall Risk Score >5  Goal: Patient will remain free of falls  01/15/2023 2322 by Augusto Garbe, RN  Outcome: Progressing  Flowsheets (Taken 01/15/2023 2000)  High (Greater than 13):   HIGH-Consider use of low bed   HIGH-Initiate use of floor mats as appropriate   HIGH-Pharmacy to initiate evaluation and intervention per protocol   HIGH-Apply yellow "Fall Risk" arm band   HIGH-Bed alarm on at all times while patient in bed   HIGH-Visual cue at entrance to patient's room   MOD-Re-orient confused patients  01/15/2023 2322 by Augusto Garbe, RN  Flowsheets (Taken 01/15/2023 2000)  High (Greater than 13):   HIGH-Consider use of low bed   HIGH-Initiate use of floor mats as appropriate   HIGH-Pharmacy to initiate evaluation and intervention per protocol   HIGH-Apply yellow "Fall Risk" arm band   HIGH-Bed alarm on at all times while patient in bed   HIGH-Visual cue at entrance to patient's room   MOD-Re-orient confused patients     Problem: Impaired Mobility  Goal: Mobility/Activity is maintained at optimal level for patient  01/15/2023 2322 by Augusto Garbe, RN  Outcome: Progressing  01/15/2023 2322 by Augusto Garbe, RN  Outcome: Progressing  Flowsheets (Taken 01/15/2023 2322)  Mobility/activity is maintained at optimal level for patient: Encourage independent activity per ability     Problem: Peripheral Neurovascular Impairment  Goal: Extremity color, movement, sensation are maintained or improved  Outcome: Progressing  Flowsheets (Taken 01/15/2023 2322)  Extremity color, movement, sensation are maintained or improved:   Assess and monitor application of corrective devices (cast, brace, splint), check skin integrity   Assess extremity for proper alignment     Problem: Neurological Deficit  Goal: Neurological status is stable or improving  01/15/2023 2322 by Augusto Garbe, RN  Outcome: Progressing  01/15/2023 2322 by Augusto Garbe,  RN  Outcome: Progressing  Flowsheets (Taken 01/15/2023 2322)  Neurological status is stable or improving:   Monitor/assess/document neurological assessment (Stroke: every 4 hours)   Re-assess NIH Stroke Scale for any change in status   Monitor/assess NIH Stroke Scale   Observe for seizure activity and initiate seizure precautions if indicated   Perform CAM Assessment

## 2023-01-15 NOTE — Progress Notes (Signed)
[facility] Note:  Referral received. Pt has been identified as a potential candidate for acute rehab. Will evaluate, interview, and identify a safe discharge plan if appropriate.     Will follow patient for continued acute inpatient rehab appropriateness and medical clearance.  Will verify post rehab discharge plan with the patient/caregiver.    CM/SW notified of decision.      Grace Han, PT, DPT  [facility] Admissions Liaison  [phone]

## 2023-01-15 NOTE — Progress Notes (Addendum)
Situation:   January 15, 2023   Patient information: [name], [name], 62 y.o., female,   Hospital day 0  Admission Diagnosis: Spinal stenosis of lumbosacral region [M48.07]           Current Discharge Plan : Acute rehabilitation- CM placed referrals in [facility].    Barriers identified: Nsgy consulted for vertebral fractures and possible epidural small hematoma at the T12 level. Pulled IVs, Avasys placed. Brace placed yesterday- pending PT consult. Awaiting medical clearance for discharge.      Reyli Schroth

## 2023-01-15 NOTE — Progress Notes (Signed)
C[facility] HOSPITALIST PROGRESS NOTE    Date Time: 01/15/23 5:07 PM  Patient Name: [name]  Attending Physician: Frederich Cha, DO      Assessment   62 y.o. female hx DVT 2012, prior ulcerative colitis, HTN who presents with bilateral leg weakness, urinary incontinence likely secondary to 6 mm fluid collection at the T12 vertebrae extending to the mid inferior T11 and mid L1 indenting the anterior thecal sac likely from a small hematoma.      Plan   #Back pain from an acute T10 vertebral fracture with 2 mm retropulsion  #Bilateral leg weakness, urinary incontinence likely from focal fluid collection measuring 6 mm extending from the inferior T11 to mid L1 level causing indentation of the anterior thecal sac displacing the thecal sac likely from an epidural small hematoma  #Subacute T11 vertebral fracture, and subacute T12 vertebral fracture  #Old L1-L3 compression fractures  -Transfer patient to the neuro [facility]  -Neurochecks every 2 hours  -Fall, aspiration, delirium precautions  -Analgesia CK normal.   -CT lumbar spine suggested L3-L4 canal stenosis. It also mentioned T12 fracture new since 01/20/21 but likely chronic.   -MRI lumbar spine w/wo contrast reviewed.  -Neurosurgery was consulted for vertebral fractures and possible epidural small hematoma at the T12 level.  -No NSAIDs, anticoagulation, or antiplatelet therapy.  -TLSO back brace when OOB  -Upright x-rays of the thoracic spine reviewed.  Limited visualization.  Multilevel compression fractures.  Bones are relatively demineralized in appearance.  -Has poor dentition therefore I want dental clearance prior to starting on bone health therapy (bisphosphonates or Prolia).  -Patient's last dose of Xarelto was on 01/07/2023 or 01/08/2023.  -Appreciate neurosurgery input  -OT/PT    #Hx DVT 2012, prior ulcerative colitis, HTN - Noted.   -Patient was cleared to restart Xarelto 10 mg p.o. nightly on 01/19/2023 by neurosurgery.  -Venous Doppler ultrasound of bilateral  lower extremities to rule out DVT  -Hematology input appreciated    DVT prophy: SCDs. Lovenox.   Diet: regular  Code status: full  Status: observation spine ward               Recent Labs   Lab 01/15/23  0328 01/14/23  0330 01/13/23  2203   Hemoglobin 12.4 11.1* 11.9   Hematocrit 36.8 32.6* 35.3   MCV 103.7* 104.2* 103.2*   WBC 7.83 8.30 10.03*   Platelet Count 282 250 243         Anemia Diagnosis: Acute: Acute Anemia, Unspecified             History of Presenting Illness and Interval History/24 hour Events:   CC: bilateral leg weakness, urinary incontinence     HPI per Admitting Provider  "  Lazetta Petite is a 62 y.o. female hx DVT 2012, prior ulcerative colitis, HTN who presents with bilateral leg weakness, urinary incontinence. This has been for 3 days. She has difficulty making to the bathroom so had urinary incontinence. She also notes low back pain. She is supposed to be on Xarelto but has not taken it for 2 days as she ran out. These symptoms are sudden onset, moderate intensity, without alleviating factors.  "    7/25: Patient reports that she fell into the wall approximately week ago.  She ran out of Xarelto anticoagulation medications on Thursday or Friday of last week.  She has not taken Xarelto for at least 6 days.  Patient does have intermittent low back pain.    7/26: Patient reports  that her back pain is currently controlled.  Patient does have weakness of her bilateral lower extremity.  Her urinary incontinence has resolved.    Physical Exam:     Vitals:    01/15/23 1600   BP: 107/77   Pulse: 81   Resp: 20   Temp:    SpO2:        Intake and Output Summary (Last 24 hours) at Date Time  No intake or output data in the 24 hours ending 01/15/23 1707    General: awake, alert, oriented x 3; no acute distress.  HEENT: Normocephalic, atraumatic. MMM  Neck: supple  Cardiovascular: regular rate and rhythm, no murmurs, rubs or gallops  Lungs: clear to auscultation bilaterally, without wheezing, rhonchi, or  rales  Abdomen: soft, non-tender, non-distended; no palpable masses, no hepatosplenomegaly, normoactive bowel sounds, no rebound or guarding  Extremities: no clubbing, cyanosis, or edema  Neuro: cranial nerves grossly intact, strength 5/5 in upper and 3/5 hip flexion and knee extension and 4+/5 dorsiflexion and plantarflexion, sensation intact, knee DTRs 2/4, follows commands  Skin: no rashes or lesions noted      Medications:     Current Facility-Administered Medications   Medication Dose Route Frequency    lidocaine  1 patch Transdermal Q24H    methocarbamol  500 mg Oral QID    metoprolol tartrate  50 mg Oral Q12H SCH    pantoprazole  40 mg Oral Daily    timolol  1 drop Left Eye Daily         Labs:     Results       Procedure Component Value Units Date/Time    Basic Metabolic Panel [[id number]]  (Abnormal) Collected: 01/15/23 0328    Specimen: Blood, Venous Updated: 01/15/23 0441     Glucose 81 mg/dL      BUN 9 mg/dL      Creatinine 0.7 mg/dL      Calcium 8.3 mg/dL      Sodium 914 mEq/L      Potassium 3.9 mEq/L      Chloride 107 mEq/L      CO2 23 mEq/L      Anion Gap 7.0     GFR >60.0 mL/min/1.73 m2     CBC without Differential [[id number]]  (Abnormal) Collected: 01/15/23 0328    Specimen: Blood, Venous Updated: 01/15/23 0417     WBC 7.83 x10 3/uL      Hemoglobin 12.4 g/dL      Hematocrit 08.6 %      Platelet Count 282 x10 3/uL      MPV 9.9 fL      RBC 3.55 x10 6/uL      MCV 103.7 fL      MCH 34.9 pg      MCHC 33.7 g/dL      RDW 16 %      nRBC % 0.0 /100 WBC      Absolute nRBC 0.00 x10 3/uL               Radiology:     Radiology Results (24 Hour)       Procedure Component Value Units Date/Time    XR Thoracic Spine AP And Lateral [[id number]] Collected: 01/15/23 1057    Order Status: Completed Updated: 01/15/23 1101    Narrative:      HISTORY: stability in brace.     COMPARISON: None available.    FINDINGS:   Visualization is limited. There is mild thoracic dextrocurvature. There are  degenerative changes. The bones  are relatively demineralized in appearance.  There are multilevel moderate and prominent compression fractures at the  thoracolumbar junction with mild kyphosis. There is suspected mild  compression fracture in the midthoracic spine.      Impression:          Limited visualization. Multilevel compression fractures.    Melody Haver, MD  01/15/2023 10:59 AM            Lines  Patient Lines/Drains/Airways Status       Active PICC Line / CVC Line / PIV Line / Drain / Airway / Intraosseous Line / Epidural Line / ART Line / Line / Wound / Pressure Ulcer / NG/OG Tube       Name Placement date Placement time Site Days    Peripheral IV 01/13/23 18 G Standard Left Antecubital 01/13/23  1903  Antecubital  less than 1    Wound 05/25/19 Surgical Incision Eye Left eye pads, paper tape 05/25/19  1116  Eye  1329                           Signed by: Frederich Cha, DO, MD,  Cc    I have spent 50 minutes with the patient on counseling, chart review, and coordinating care

## 2023-01-15 NOTE — Plan of Care (Signed)
Problem: Moderate/High Fall Risk Score >5  Goal: Patient will remain free of falls  Outcome: Progressing  Flowsheets (Taken 01/14/2023 2000)  High (Greater than 13):   MOD-Consider a move closer to [employer] with patient during toileting   MOD-Use of assistive devices -Bedside Commode if appropriate   MOD-Re-orient confused patients   MOD-Utilize diversion activities   MOD-Perform dangle, stand, walk (DSW) prior to mobilization   MOD-Include family in multidisciplinary POC discussions   MOD-Place Fall Risk level on whiteboard in room     Problem: Pain interferes with ability to perform ADL  Goal: Pain at adequate level as identified by patient  Outcome: Progressing  Flowsheets (Taken 01/14/2023 2000)  Pain at adequate level as identified by patient:   Identify patient comfort function goal   Evaluate if patient comfort function goal is met   Offer non-pharmacological pain management interventions   Include patient/patient care companion in decisions related to pain management as needed     Problem: Impaired Mobility  Goal: Mobility/Activity is maintained at optimal level for patient  Outcome: Progressing  Flowsheets (Taken 01/14/2023 0012 by Billey Chang, RN)  Mobility/activity is maintained at optimal level for patient: Encourage independent activity per ability     Problem: Peripheral Neurovascular Impairment  Goal: Extremity color, movement, sensation are maintained or improved  Outcome: Progressing  Flowsheets (Taken 01/14/2023 0012 by Billey Chang, RN)  Extremity color, movement, sensation are maintained or improved:   Assess and monitor application of corrective devices (cast, brace, splint), check skin integrity   Assess extremity for proper alignment     Problem: Compromised skin integrity  Goal: Skin integrity is maintained or improved  Outcome: Progressing  Flowsheets (Taken 01/14/2023 0012 by Billey Chang, RN)  Skin integrity is maintained or improved:   Assess Braden Scale every shift   Increase  activity as tolerated/progressive mobility   Relieve pressure to bony prominences   Avoid shearing   Keep skin clean and dry   Encourage use of lotion/moisturizer on skin   Monitor patient's hygiene practices     Problem: Neurological Deficit  Goal: Neurological status is stable or improving  Outcome: Progressing  Flowsheets (Taken 01/14/2023 0012 by Billey Chang, RN)  Neurological status is stable or improving:   Monitor/assess NIH Stroke Scale   Observe for seizure activity and initiate seizure precautions if indicated   Perform CAM Assessment     Problem: Compromised Sensory Perception  Goal: Sensory Perception Interventions  Outcome: Progressing  Flowsheets (Taken 01/15/2023 0200)  Sensory Perception Interventions: Offload heels, Pad bony prominences, Reposition q 2hrs/turn Clock, Q2 hour skin assessment under devices if present     Problem: Compromised Friction/Shear  Goal: Friction and Shear Interventions  Outcome: Progressing  Flowsheets (Taken 01/15/2023 0200)  Friction and Shear Interventions: Pad bony prominences, Off load heels, HOB 30 degrees or less unless contraindicated, Consider: TAP seated positioning, Heel foams

## 2023-01-16 ENCOUNTER — Encounter
Admission: EM | Disposition: A | Discharge status: Skilled Nursing Facility | Payer: Self-pay | Source: Home / Self Care | Attending: Internal Medicine

## 2023-01-16 HISTORY — PX: IVC FILTER PLACEMENT: IMG2590

## 2023-01-16 LAB — BASIC METABOLIC PANEL
Anion Gap: 9 (ref 5.0–15.0)
BUN: 8 mg/dL (ref 7–21)
CO2: 19 mEq/L (ref 17–29)
Calcium: 7.9 mg/dL — ABNORMAL LOW (ref 8.5–10.5)
Chloride: 109 mEq/L (ref 99–111)
Creatinine: 0.6 mg/dL (ref 0.4–1.0)
GFR: >60 mL/min/{1.73_m2} (ref >=60.0)
Glucose: 81 mg/dL (ref 70–100)
Potassium: 4.1 mEq/L (ref 3.5–5.3)
Sodium: 137 mEq/L (ref 135–145)

## 2023-01-16 LAB — CBC
Absolute nRBC: 0 10*3/uL (ref <=0.00)
Hematocrit: 32.4 % — ABNORMAL LOW (ref 34.7–43.7)
Hemoglobin: 11.1 g/dL — ABNORMAL LOW (ref 11.4–14.8)
MCH: 35.4 pg — ABNORMAL HIGH (ref 25.1–33.5)
MCHC: 34.3 g/dL (ref 31.5–35.8)
MCV: 103.2 fL — ABNORMAL HIGH (ref 78.0–96.0)
MPV: 10.1 fL (ref 8.9–12.5)
Platelet Count: 245 10*3/uL (ref 142–346)
RBC: 3.14 10*6/uL — ABNORMAL LOW (ref 3.90–5.10)
RDW: 15 % (ref 11–15)
WBC: 6.99 10*3/uL (ref 3.10–9.50)
nRBC %: 0 /100 WBC (ref <=0.0)

## 2023-01-16 SURGERY — IVC FILTER PLACEMENT
Anesthesia: Anesthesia Choice | Site: Abdomen

## 2023-01-16 SURGERY — IVC FILTER PLACEMENT

## 2023-01-16 MED ORDER — MIDAZOLAM HCL 1 MG/ML IJ SOLN (WRAP)
INTRAMUSCULAR | Status: AC | PRN
Start: 2023-01-16 — End: 2023-01-16
  Administered 2023-01-16: 1 mg via INTRAVENOUS

## 2023-01-16 MED ORDER — MIDAZOLAM HCL 1 MG/ML IJ SOLN (WRAP)
INTRAMUSCULAR | Status: AC | PRN
Start: 2023-01-16 — End: 2023-01-16
  Administered 2023-01-16: .5 mg via INTRAVENOUS

## 2023-01-16 MED ORDER — LIDOCAINE HCL 1 % IJ SOLN
INTRAMUSCULAR | Status: AC
Start: 2023-01-16 — End: ?
  Filled 2023-01-16: qty 10

## 2023-01-16 MED ORDER — FENTANYL CITRATE (PF) 50 MCG/ML IJ SOLN (WRAP)
INTRAMUSCULAR | Status: AC | PRN
Start: 2023-01-16 — End: 2023-01-16
  Administered 2023-01-16: 25 ug via INTRAVENOUS

## 2023-01-16 MED ORDER — FENTANYL CITRATE (PF) 50 MCG/ML IJ SOLN (WRAP)
INTRAMUSCULAR | Status: AC | PRN
Start: 2023-01-16 — End: 2023-01-16
  Administered 2023-01-16: 50 ug via INTRAVENOUS

## 2023-01-16 MED ORDER — HEPARIN (PORCINE) IN NACL 2-0.9 UNIT/ML-% IJ SOLN (WRAP)
INTRAVENOUS | Status: AC
Start: 2023-01-16 — End: ?
  Filled 2023-01-16: qty 1000

## 2023-01-16 MED ORDER — FENTANYL CITRATE (PF) 50 MCG/ML IJ SOLN (WRAP)
INTRAMUSCULAR | Status: AC
Start: 2023-01-16 — End: ?
  Filled 2023-01-16: qty 2

## 2023-01-16 MED ORDER — MIDAZOLAM HCL 1 MG/ML IJ SOLN (WRAP)
INTRAMUSCULAR | Status: AC
Start: 2023-01-16 — End: ?
  Filled 2023-01-16: qty 2

## 2023-01-16 MED ORDER — IODIXANOL 320 MG/ML IV SOLN
45.0000 mL | Freq: Once | INTRAVENOUS | Status: AC | PRN
Start: 2023-01-16 — End: 2023-01-16
  Administered 2023-01-16: 45 mL via INTRAVENOUS

## 2023-01-16 MED ORDER — ALPRAZOLAM 0.5 MG PO TABS
0.2500 mg | ORAL_TABLET | Freq: Once | ORAL | Status: AC
Start: 2023-01-16 — End: 2023-01-16
  Administered 2023-01-16: 0.25 mg via ORAL
  Filled 2023-01-16: qty 1

## 2023-01-16 MED ORDER — LIDOCAINE HCL 1 % IJ SOLN
INTRAMUSCULAR | Status: AC | PRN
Start: 2023-01-16 — End: 2023-01-16
  Administered 2023-01-16: 8 mL via INTRADERMAL

## 2023-01-16 SURGICAL SUPPLY — 10 items
CATHETER OD5 FR L70 CM RADIOPAQUE FLUSH (Catheter Micellaneous)
CATHETER OD5 FR L70 CM RADIOPAQUE FLUSH SEGMENT SIZING PIGTAIL CURVE (Catheter Miscellaneous) IMPLANT
FILTER EMBL DELIVERY KIT DENALI NITINOL (Filter) ×1 IMPLANT
FILTER EMBOLIZATION DELIVERY KIT DENALI NITINOL VENA CAVA FEMORAL (Filter) ×1 IMPLANT
GUIDEWIRE VASCULAR OD.035 IN L150 CM L15 (Guidewire) ×1
GUIDEWIRE VASCULAR OD.035 IN L150 CM L15 CM STARTER BENTSON STRAIGHT (Guidewire) ×1 IMPLANT
KIT INTRODUCER L10 CM .018 IN STANDARD (Introducer) ×1
KIT INTRODUCER L10 CM .018 IN STANDARD GUIDEWIRE NEEDLE MINI ACCESS (Introducer) ×1 IMPLANT
SHEATH INTRODUCER L10 CM DILATOR KINK (Introducer)
SHEATH INTRODUCER L10 CM DILATOR KINK RESISTANT SMOOTH TRANSITION ID8 (Introducer) IMPLANT

## 2023-01-16 NOTE — Plan of Care (Signed)
Interventional Radiology  PLAN OF CARE    Date Time: 01/16/23 4:13 AM  Patient Name: [name], [name]      Assessment:   62 year old female with extensive DVT left leg, per-neurosurgery unable to start any AC until 7/30    - AVSS  - [place] 7/26: Nonocclusive thrombus within the right popliteal vein.Thrombus within the left common femoral, greater saphenous, femoral,popliteal, and gastrocnemius veins as described.    Plan:   - Temporary IVCF 7/27 with IR, pending schedule availability   - Spoke with patient, patient is aware of procedure and agreeable. Aware that temporary filter follow up is needed within the next three months and information added to paperwork  - Rest of the care per primary team        Manus Rudd FNP-BC  Interventional Radiology  [facility]  Spectralink/Inpatient consults 7:30am-5pm Monday-Friday: [phone]   After hours office ph: [phone]

## 2023-01-16 NOTE — Progress Notes (Signed)
[facility] HOSPITALIST PROGRESS NOTE    Date Time: 01/16/23 8:19 AM  Patient Name: [name]  Attending Physician: Frederich Cha, DO      Assessment   62 y.o. female hx DVT 2012, prior ulcerative colitis, HTN who presents with bilateral leg weakness, urinary incontinence likely secondary to 6 mm fluid collection at the T12 vertebrae extending to the mid inferior T11 and mid L1 indenting the anterior thecal sac likely from a small hematoma.      Plan   #Back pain from an acute T10 vertebral fracture with 2 mm retropulsion  #Bilateral leg weakness, urinary incontinence likely from focal fluid collection measuring 6 mm extending from the inferior T11 to mid L1 level causing indentation of the anterior thecal sac displacing the thecal sac likely from an epidural small hematoma  #Subacute T11 vertebral fracture, and subacute T12 vertebral fracture  #Old L1-L3 compression fractures  -Transfer patient to the neuro [facility]  -Neurochecks every 2 hours  -Fall, aspiration, delirium precautions  -Analgesia CK normal.   -CT lumbar spine suggested L3-L4 canal stenosis. It also mentioned T12 fracture new since 01/20/21 but likely chronic.   -MRI lumbar spine w/wo contrast reviewed.  -Neurosurgery was consulted for vertebral fractures and possible epidural small hematoma at the T12 level.  -No NSAIDs, anticoagulation, or antiplatelet therapy.  -TLSO back brace when OOB  -Upright x-rays of the thoracic spine reviewed.  Limited visualization.  Multilevel compression fractures.  Bones are relatively demineralized in appearance.  -Has poor dentition therefore I want dental clearance prior to starting on bone health therapy (bisphosphonates or Prolia).  -Patient's last dose of Xarelto was on 01/07/2023 or 01/08/2023.  -Appreciate neurosurgery input  -OT/PT    #Acute left LE DVT  #Hx DVT 2012, prior ulcerative colitis, HTN - Noted.   -Patient was cleared to restart Xarelto 10 mg p.o. nightly on 01/19/2023 by neurosurgery.  -Consider Eliquis 5mg   twice daily instead of Xarelto   -Venous Doppler ultrasound of bilateral lower extremities  1. Nonocclusive thrombus within the right popliteal vein.  2. Thrombus within the left common femoral, greater saphenous, femoral,  popliteal, and gastrocnemius veins as described.  -Plan for temporary IVC filter   -Hematology input appreciated    #Unintentional weight loss   #former smoker  -CT CAP r/o malignancy    DVT prophy: SCDs. Lovenox.   Diet: regular  Code status: full  Status: inpatient                   Recent Labs   Lab 01/16/23  0104 01/15/23  0328 01/14/23  0330   Hemoglobin 11.1* 12.4 11.1*   Hematocrit 32.4* 36.8 32.6*   MCV 103.2* 103.7* 104.2*   WBC 6.99 7.83 8.30   Platelet Count 245 282 250         Anemia Diagnosis: Acute: Acute Anemia, Unspecified             History of Presenting Illness and Interval History/24 hour Events:   CC: bilateral leg weakness, urinary incontinence     HPI per Admitting Provider  "  [name] is a 62 y.o. female hx DVT 2012, prior ulcerative colitis, HTN who presents with bilateral leg weakness, urinary incontinence. This has been for 3 days. She has difficulty making to the bathroom so had urinary incontinence. She also notes low back pain. She is supposed to be on Xarelto but has not taken it for 2 days as she ran out. These symptoms are sudden onset,  moderate intensity, without alleviating factors.  "    7/25: Patient reports that she fell into the wall approximately week ago.  She ran out of Xarelto anticoagulation medications on Thursday or Friday of last week.  She has not taken Xarelto for at least 6 days.  Patient does have intermittent low back pain.    7/26: Patient reports that her back pain is currently controlled.  Patient does have weakness of her bilateral lower extremity.  Her urinary incontinence has resolved.    7/27: Pt has no new complaints.  Her husband reports that she is non-compliant with medical follow up since the COVID pandemic.  Pt has had 15  lbs weight loss in 3-4 months.  She quit smoking 15 yrs ago when she was diagnosed with a DVT.     Physical Exam:     Vitals:    01/16/23 0605   BP: 115/66   Pulse: 72   Resp: 22   Temp:    SpO2: 97%       Intake and Output Summary (Last 24 hours) at Date Time    Intake/Output Summary (Last 24 hours) at 01/16/2023 0819  Last data filed at 01/15/2023 2100  Gross per 24 hour   Intake 0 ml   Output --   Net 0 ml       General: awake, alert, oriented x 3; no acute distress.  HEENT: Normocephalic, atraumatic. MMM  Neck: supple  Cardiovascular: regular rate and rhythm, no murmurs, rubs or gallops  Lungs: clear to auscultation bilaterally, without wheezing, rhonchi, or rales  Abdomen: soft, non-tender, non-distended; no palpable masses, no hepatosplenomegaly, normoactive bowel sounds, no rebound or guarding  Extremities: no clubbing, cyanosis, or edema  Neuro: cranial nerves grossly intact, strength 5/5 in upper and 3/5 hip flexion and knee extension and 4+/5 dorsiflexion and plantarflexion, sensation intact, knee DTRs 2/4, follows commands  Skin: no rashes or lesions noted      Medications:     Current Facility-Administered Medications   Medication Dose Route Frequency    lidocaine  1 patch Transdermal Q24H    methocarbamol  500 mg Oral QID    metoprolol tartrate  50 mg Oral Q12H SCH    pantoprazole  40 mg Oral Daily    timolol  1 drop Left Eye Daily         Labs:     Results       Procedure Component Value Units Date/Time    Basic Metabolic Panel [[id number]]  (Abnormal) Collected: 01/16/23 0104    Specimen: Blood, Venous Updated: 01/16/23 0135     Glucose 81 mg/dL      BUN 8 mg/dL      Creatinine 0.6 mg/dL      Calcium 7.9 mg/dL      Sodium 474 mEq/L      Potassium 4.1 mEq/L      Chloride 109 mEq/L      CO2 19 mEq/L      Anion Gap 9.0     GFR >60.0 mL/min/1.73 m2     CBC without Differential [[id number]]  (Abnormal) Collected: 01/16/23 0104    Specimen: Blood, Venous Updated: 01/16/23 0123     WBC 6.99 x10 3/uL       Hemoglobin 11.1 g/dL      Hematocrit 64.3 %      Platelet Count 245 x10 3/uL      MPV 10.1 fL      RBC 3.14 x10 6/uL  MCV 103.2 fL      MCH 35.4 pg      MCHC 34.3 g/dL      RDW 15 %      nRBC % 0.0 /100 WBC      Absolute nRBC 0.00 x10 3/uL               Radiology:     Radiology Results (24 Hour)       Procedure Component Value Units Date/Time    US Venous Duplex Doppler Leg Bilateral [[id number]] Collected: 01/15/23 2032    Order Status: Completed Updated: 01/15/23 2044    Narrative:      HISTORY: Bilateral leg edema    COMPARISON: 01/12/2020    TECHNIQUE: Wallace Cullens scale, color flow, and spectral Doppler waveform analysis  was initially performed on the bilateral lower extremity veins described  below. There is normal compressibility with no color-flow filling defects  unless otherwise noted.    FINDINGS:   Right lower extremity:    Common femoral vein: Normal  Greater saphenous vein at the saphenofemoral junction: Normal  Deep femoral vein (proximal portion): Normal  Femoral vein: Normal  Popliteal vein: Nonocclusive thrombus  Posterior tibial veins: Normal  Peroneal veins: Normal    Left lower extremity:    Common femoral vein: Nonocclusive thrombus  Greater saphenous vein at the saphenofemoral junction: Nonocclusive  thrombus  Deep femoral vein (proximal portion): Nonocclusive thrombus  Femoral vein: Occlusive thrombus within the proximal to midportion.  Nonocclusive thrombus distally.  Popliteal vein: Nonocclusive thrombus  Posterior tibial veins: Normal  Peroneal veins: Normal  Gastrocnemius veins: Occlusive thrombus    Additional images of the IVC and iliac veins were obtained. No thrombus is  seen within the IVC or bilateral common iliac and external iliac veins.      Impression:         1. Nonocclusive thrombus within the right popliteal vein.  2. Thrombus within the left common femoral, greater saphenous, femoral,  popliteal, and gastrocnemius veins as described.  3. These urgent results were discussed with  and acknowledged by Dr. Adin Hector on 01/15/2023 8:41 PM.    Gerlene Burdock, MD  01/15/2023 8:42 PM    US Aorta IVC Iliac Duplex Doppler Complete [[id number]] Collected: 01/15/23 2032    Order Status: Completed Updated: 01/15/23 2044    Narrative:      HISTORY: Bilateral leg edema    COMPARISON: 01/12/2020    TECHNIQUE: Wallace Cullens scale, color flow, and spectral Doppler waveform analysis  was initially performed on the bilateral lower extremity veins described  below. There is normal compressibility with no color-flow filling defects  unless otherwise noted.    FINDINGS:   Right lower extremity:    Common femoral vein: Normal  Greater saphenous vein at the saphenofemoral junction: Normal  Deep femoral vein (proximal portion): Normal  Femoral vein: Normal  Popliteal vein: Nonocclusive thrombus  Posterior tibial veins: Normal  Peroneal veins: Normal    Left lower extremity:    Common femoral vein: Nonocclusive thrombus  Greater saphenous vein at the saphenofemoral junction: Nonocclusive  thrombus  Deep femoral vein (proximal portion): Nonocclusive thrombus  Femoral vein: Occlusive thrombus within the proximal to midportion.  Nonocclusive thrombus distally.  Popliteal vein: Nonocclusive thrombus  Posterior tibial veins: Normal  Peroneal veins: Normal  Gastrocnemius veins: Occlusive thrombus    Additional images of the IVC and iliac veins were obtained. No thrombus is  seen within the IVC or bilateral common iliac and external iliac veins.  Impression:         1. Nonocclusive thrombus within the right popliteal vein.  2. Thrombus within the left common femoral, greater saphenous, femoral,  popliteal, and gastrocnemius veins as described.  3. These urgent results were discussed with and acknowledged by Dr. Adin Hector on 01/15/2023 8:41 PM.    Gerlene Burdock, MD  01/15/2023 8:42 PM    XR Thoracic Spine AP And Lateral [[id number]] Collected: 01/15/23 1057    Order Status: Completed Updated: 01/15/23 1101    Narrative:      HISTORY: stability  in brace.     COMPARISON: None available.    FINDINGS:   Visualization is limited. There is mild thoracic dextrocurvature. There are  degenerative changes. The bones are relatively demineralized in appearance.  There are multilevel moderate and prominent compression fractures at the  thoracolumbar junction with mild kyphosis. There is suspected mild  compression fracture in the midthoracic spine.      Impression:          Limited visualization. Multilevel compression fractures.    Melody Haver, MD  01/15/2023 10:59 AM            Lines  Patient Lines/Drains/Airways Status       Active PICC Line / CVC Line / PIV Line / Drain / Airway / Intraosseous Line / Epidural Line / ART Line / Line / Wound / Pressure Ulcer / NG/OG Tube       Name Placement date Placement time Site Days    Peripheral IV 01/13/23 18 G Standard Left Antecubital 01/13/23  1903  Antecubital  less than 1    Wound 05/25/19 Surgical Incision Eye Left eye pads, paper tape 05/25/19  1116  Eye  1329                           Signed by: Frederich Cha, DO, MD,  Cc    I have spent 50 minutes with the patient on counseling, chart review, and coordinating care

## 2023-01-16 NOTE — UM Notes (Addendum)
01/16/23 1549  Admit to Inpatient  Once         01/16/23 1549     Inpatient review  Unit: Neuroscience [facility]     OBS admit: January 13, 2023  OBS to Inpatient upgrade requiring > 3 MN beyond OBS level of care for large DVT unable to be anticoagulated due to epidural hematoma from fall    [name]  1961/03/11    62 year old female with past medical history of DVT currently on Xarelto who presented to the hospital after sustaining a fall resulting in lower extremity weakness and urinary incontinence. She had imaging done which showed acute T10 vertebral fracture with the limited retropulsion and focal fluid collection measuring 6 mm extending from the inferior T11 to mid L1 causing indentation of the anterior thecal sac likely from a epidural small hematoma. She also had a subacute T11 vertebral fracture and subacute T12 vertebral fracture.     Per Overnight critical care MD " Discussed with Radiologist Venous Doppler results: extensive occlusive thrombus in LCF, saphenous, femoral, popliteal vein on L.   NSGY notes reviewed re: initiation of ANY anticoagulation 7/30 given epidural hematoma.     I discussed with Dr. Cyndie Chime: re placement of temp IVC filter (veins proximal to Femoral V without thrombus).  IR (Dr. Bonita Quin) contacted and patient scheduled for am filter placement.  In the interim; NPO p MN, SCDs d/ced. Strict bedrest"    BP 115/66   Pulse 72   Temp 97.8 F (36.6 C) (Oral)   Resp 22   Ht 1.575 m (5\' 2" )   Wt 52 kg (114 lb 10.2 oz)   SpO2 97%   BMI 20.97 kg/m     Labs: hgb 11.1, hct 32.4, Ca 7.9,     US Duplex Doppler Bilateral legs: 1. Nonocclusive thrombus within the right popliteal vein. 2. Thrombus within the left common femoral, greater saphenous, femoral, popliteal, and gastrocnemius veins as described.    Current Facility-Administered Medications   Medication Dose Route Frequency    lidocaine  1 patch Transdermal Q24H    methocarbamol  500 mg Oral QID    metoprolol tartrate  50 mg Oral Q12H SCH     pantoprazole  40 mg Oral Daily    timolol  1 drop Left Eye Daily     Continuous Infusions:  PRN Meds:.acetaminophen, calcium carbonate, cetirizine, diazePAM, hydrALAZINE, loperamide, magnesium sulfate, melatonin, morphine, naloxone, ondansetron **OR** ondansetron, oxyCODONE, potassium chloride **OR** potassium chloride, senna-docusate    Plan:  #Back pain from an acute T10 vertebral fracture with 2 mm retropulsion  #Bilateral leg weakness, urinary incontinence likely from focal fluid collection measuring 6 mm extending from the inferior T11 to mid L1 level causing indentation of the anterior thecal sac displacing the thecal sac likely from an epidural small hematoma  #Subacute T11 vertebral fracture, and subacute T12 vertebral fracture  #Old L1-L3 compression fractures  -Transfer patient to the neuro [facility]  -Neurochecks every 2 hours  -Fall, aspiration, delirium precautions  -Analgesia CK normal.   -CT lumbar spine suggested L3-L4 canal stenosis. It also mentioned T12 fracture new since 01/20/21 but likely chronic.   -MRI lumbar spine w/wo contrast reviewed.  -Neurosurgery was consulted for vertebral fractures and possible epidural small hematoma at the T12 level.  -No NSAIDs, anticoagulation, or antiplatelet therapy.  -TLSO back brace when OOB  -Upright x-rays of the thoracic spine reviewed.  Limited visualization.  Multilevel compression fractures.  Bones are relatively demineralized in appearance.  -Has poor  dentition therefore I want dental clearance prior to starting on bone health therapy (bisphosphonates or Prolia).  -Patient's last dose of Xarelto was on 01/07/2023 or 01/08/2023.  -Appreciate neurosurgery input  -OT/PT     #Acute left LE DVT  #Hx DVT 2012, prior ulcerative colitis, HTN - Noted.   -Patient was cleared to restart Xarelto 10 mg p.o. nightly on 01/19/2023 by neurosurgery.  -Consider Eliquis 5mg  twice daily instead of Xarelto   -Venous Doppler ultrasound of bilateral lower extremities  1.  Nonocclusive thrombus within the right popliteal vein.  2. Thrombus within the left common femoral, greater saphenous, femoral,  popliteal, and gastrocnemius veins as described.  -Plan for temporary IVC filter   -Hematology input appreciated     #Unintentional weight loss   #former smoker  -CT CAP r/o malignancy    Operative Procedure:   Procedure(s):  Temporary IVC FILTER PLACEMENT     Preoperative Diagnosis:   Pre-Op Diagnosis Codes:      * Clot [I82.90]     Postoperative Diagnosis:   Clot [I82.90]    Pollyann Kennedy, RN, BSN CNRN  UR Case Manager   Utilization Review  [facility]   [address]  [address], [address]  [place], [place] [address]  Phone : [phone]   [place] [phone]  Fax: [phone]    [email or web address]

## 2023-01-16 NOTE — Progress Notes (Signed)
[name] is a 62 y.o. female who presents to the hospital on 01/13/2023       Procedure(s):  Temporary IVC FILTER PLACEMENT    Day of Surgery  -------------------     Most Recent Set of Vitals   Visit Vitals  BP (!) 131/93   Pulse 72   Temp 97.1 F (36.2 C) (Oral)   Resp 15   Ht 1.575 m (5\' 2" )   Wt 52 kg (114 lb 10.2 oz)   SpO2 100%   BMI 20.97 kg/m     Patient arrived on the unit from NT7 at 18:30.  Alert and oriented x 4, denies SOB or Chest pain. O2 sats 100% on RA.  Rates back pain at 2/10.    Oriented patient to room and call light, bed in lowest position, call light within reach.     Intake and Output Summary (Last 24 hours) at Date Time    Intake/Output Summary (Last 24 hours) at 01/16/2023 1859  Last data filed at 01/15/2023 2100  Gross per 24 hour   Intake 0 ml   Output --   Net 0 ml         Patient Lines/Drains/Airways Status       Active Lines, Drains and Airways       Name Placement date Placement time Site Days    Peripheral IV 01/16/23 20 G Anterior;Left;Proximal Forearm 01/16/23  1645  Forearm  less than 1    External Urinary Catheter 01/15/23  2100  --  less than 1    Venous Sheath 8 Fr. Right Femoral 01/16/23  1357  Femoral  less than 1

## 2023-01-16 NOTE — Progress Notes (Signed)
Progress note:   Assumed care of patient. Pt A&O X3-4 Impulsive. VSS. No SOB. Lying in bed.  surgical dressings are clean, dry, and intact.  Continent of bowel and bladder. LBM 7/27. Pt voiding via BSC and BR.  Good pedal pulses bilaterally,cap refill <3 sec in all 4 extremities.  Denies Nausea and vomiting.  Encouraged TCDB and IS use.   Will continue hourly rounding and maintain safety, call light remains within reach. Pt utilizes this to express her needs.       Most Recent Set of Vitals   Visit Vitals  BP 124/88   Pulse 92   Temp 97.9 F (36.6 C) (Oral)   Resp 18   Ht 1.575 m (5' 2.01")   Wt 52 kg (114 lb 10.2 oz)   SpO2 100%   BMI 20.96 kg/m             Procedure(s):  Temporary IVC FILTER PLACEMENT    Day of Surgery  -------------------     Patient Lines/Drains/Airways Status       Active Lines, Drains and Airways       Name Placement date Placement time Site Days    Peripheral IV 01/16/23 20 G Anterior;Left;Proximal Forearm 01/16/23  1645  Forearm  less than 1    External Urinary Catheter 01/15/23  2100  --  less than 1    Venous Sheath 8 Fr. Right Femoral 01/16/23  1357  Femoral  less than 1                    Notable Shift Events      Azucena Fallen, RN  01/16/23  8:34 PM

## 2023-01-16 NOTE — Plan of Care (Signed)
Neurosurgery    Upright thoracic XR personally reviewed. Previously identified multilevel fractures with stable spinal alignment.     Plan:  - No acute neurosurgical intervention indicated at this time.  - Wear TLSO brace when OOB   - May don/doff brace at edge of bed.   - May shower without brace  - Activity as tolerated. Avoid excessive bending, twisting, lifting >10lbs.  - Cleared to work with PT/OT  - No driving while taking narcotic medication.  - Follow-up with in 6 weeks with repeat upright XR in brace.    Please call with further questions, neuro decline, or new neuroimaging requiring our review.    Hezzie Bump, MD, PGY-3  01/16/23  11:39 AM

## 2023-01-16 NOTE — Nursing Progress Note (Signed)
NURSING PROGRESS NOTE    [name] is a 62 y.o. female  Admitted 01/13/2023  8:43 PM [facility] day 0)    Indication for Neuro [facility] Status:   Frequency of monitoring    Major Shift Events:   US done of LE - critical result for DVT (see imaging, Dr Henriette Combs note). Bed rest initiated per orders, NPO in anticipation of procedure 7/27 morning  Melatonin x1  One-time dose of xanax for restlessness per orders as pt pulled out 2 IVs on shift    Fall, aspiration & safety precautions in place. Purposeful rounding completed. Care plan ongoing.      Review of Systems  Neuro:  AxO x3 (disoriented to situation  FC, MAE 4/5  Pupils rb  Full sensation, no numbness/tingling - see flowsheets for details    Cardiac:  NSR  SBP goal  Afebrile    Respiratory:  RA  Clear lung sounds    GI/GU:  NPO for procedure  Regular diet, thin liquids, pills crushed in applesauce    External cath in place  Requires TLSO brace when OOB, 1x to commode    Skin:  See flowsheet for details    BM this shift?  Yes-smear    Recent Labs   Lab 01/15/23  0328   Sodium 137   Potassium 3.9   Chloride 107   CO2 23   BUN 9   Creatinine 0.7   GFR >60.0   Glucose 81   Calcium 8.3*       Recent Labs   Lab 01/15/23  0328   WBC 7.83   Hemoglobin 12.4   Hematocrit 36.8   Platelet Count 282       LDAs  Patient Lines/Drains/Airways Status       Active Lines, Drains and Airways       Name Placement date Placement time Site Days    Peripheral IV 01/15/23 22 G Anterior;Left;Proximal Forearm 01/15/23  2114  Forearm  less than 1                    Indication for Central Access and estimated target removal date?  NA - PICC- Long term ABT     Indication for Foley and estimated target removal date?  NA - Retention - TBD     Safety Checklist   Fall Precautions Y   Avasys N   Seizure Precautions Y   Aspiration Precautions Y   Belongings Checked Y      Service Essentials  Medication Teaching Y   Purposeful Hourly Rounding Y         Interpreter Services:  Does the patient  require an Interpreter? No     If yes, what form of interpreter services was used? NA      If family was utilized, is interpreter waiver form signed and in the chart? NA

## 2023-01-16 NOTE — Plan of Care (Signed)
Problem: Moderate/High Fall Risk Score >5  Goal: Patient will remain free of falls  Outcome: Progressing  Flowsheets (Taken 01/16/2023 2037)  High (Greater than 13):   HIGH-Visual cue at entrance to patient's room   HIGH-Consider use of low bed   HIGH-Bed alarm on at all times while patient in bed   HIGH-Apply yellow "Fall Risk" arm band   HIGH-Initiate use of floor mats as appropriate     Problem: Pain interferes with ability to perform ADL  Goal: Pain at adequate level as identified by patient  Outcome: Progressing  Flowsheets (Taken 01/16/2023 2037)  Pain at adequate level as identified by patient:   Identify patient comfort function goal   Assess for risk of opioid induced respiratory depression, including snoring/sleep apnea. Alert healthcare team of risk factors identified.   Assess pain on admission, during daily assessment and/or before any "as needed" intervention(s)   Reassess pain within 30-60 minutes of any procedure/intervention, per Pain Assessment, Intervention, Reassessment (AIR) Cycle   Evaluate patient's satisfaction with pain management progress   Evaluate if patient comfort function goal is met   Offer non-pharmacological pain management interventions     Problem: Side Effects from Pain Analgesia  Goal: Patient will experience minimal side effects of analgesic therapy  Outcome: Progressing  Flowsheets (Taken 01/16/2023 2037)  Patient will experience minimal side effects of analgesic therapy:   Prevent/manage side effects per LIP orders (i.e. nausea, vomiting, pruritus, constipation, urinary retention, etc.)   Evaluate for opioid-induced sedation with appropriate assessment tool (i.e. POSS)   Monitor/assess patient's respiratory status (RR depth, effort, breath sounds)   Assess for changes in cognitive function     Problem: Impaired Mobility  Goal: Mobility/Activity is maintained at optimal level for patient  Outcome: Progressing  Flowsheets (Taken 01/16/2023 2037)  Mobility/activity is maintained  at optimal level for patient: Encourage independent activity per ability     Problem: Peripheral Neurovascular Impairment  Goal: Extremity color, movement, sensation are maintained or improved  Outcome: Progressing  Flowsheets (Taken 01/16/2023 2037)  Extremity color, movement, sensation are maintained or improved:   Assess extremity for proper alignment   Assess and monitor application of corrective devices (cast, brace, splint), check skin integrity     Problem: Compromised skin integrity  Goal: Skin integrity is maintained or improved  Outcome: Progressing  Flowsheets (Taken 01/16/2023 2037)  Skin integrity is maintained or improved:   Assess Braden Scale every shift   Relieve pressure to bony prominences   Avoid shearing   Keep skin clean and dry   Encourage use of lotion/moisturizer on skin   Monitor patient's hygiene practices   Increase activity as tolerated/progressive mobility     Problem: Nutrition  Goal: Nutritional intake is adequate  Outcome: Progressing  Flowsheets (Taken 01/16/2023 2037)  Nutritional intake is adequate: Consult/collaborate with Clinical Nutritionist     Problem: Neurological Deficit  Goal: Neurological status is stable or improving  Outcome: Progressing  Flowsheets (Taken 01/16/2023 2037)  Neurological status is stable or improving:   Monitor/assess/document neurological assessment (Stroke: every 4 hours)   Perform CAM Assessment     Problem: Compromised Sensory Perception  Goal: Sensory Perception Interventions  Outcome: Progressing  Flowsheets (Taken 01/16/2023 2037)  Sensory Perception Interventions: Offload heels, Pad bony prominences, Reposition q 2hrs/turn Clock, Q2 hour skin assessment under devices if present     Problem: Compromised Moisture  Goal: Moisture level Interventions  Outcome: Progressing  Flowsheets (Taken 01/16/2023 2037)  Moisture level Interventions: Moisture wicking products, Moisture barrier  cream     Problem: Compromised Activity/Mobility  Goal: Activity/Mobility  Interventions  Outcome: Progressing  Flowsheets (Taken 01/16/2023 2037)  Activity/Mobility Interventions: Pad bony prominences, TAP Seated positioning system when OOB, Promote PMP, Reposition q 2 hrs / turn clock, Offload heels     Problem: Compromised Friction/Shear  Goal: Friction and Shear Interventions  Outcome: Progressing  Flowsheets (Taken 01/16/2023 2037)  Friction and Shear Interventions: Pad bony prominences, Off load heels, HOB 30 degrees or less unless contraindicated, Consider: TAP seated positioning, Heel foams

## 2023-01-16 NOTE — Nursing Progress Note (Signed)
Pt transferred to NT8, report given to RN. Pt removed leads, pulse ox, ID armband and dressing covering venous puncture site from IR procedure, multiple attempts to get OOB with AVASYS and bedalarm inplace . AVASYS remained bedside while in NT7. Patient a/ox4, MAE 4/5. Noncompliant with using TLSO brace when OOB despite education given. Pt informed husband via telephone call of transfer.

## 2023-01-16 NOTE — H&P (Signed)
BRIEF IR HISTORY AND PHYSICAL    Date Time: 01/16/23 12:44 PM    INDICATIONS:   Procedure(s):  Temporary IVC FILTER PLACEMENT      PROCEDURALIST COMMENTS BELOW:   53F with hx of hemorrhagic stroke and LE DVT, here for IVCF placement.    PAST MEDICAL HISTORY:   Medical History[1]    PAST SURGICAL HISTORY     Past Surgical History:   Procedure Laterality Date    BUNIONECTOMY      left foot    CATARACT EXTRACTION      EYE SURGERY Bilateral 2013    laser surgery for glaucoma, bilateral trabeculectomy    GLAUCOMA SURGERY      HYSTERECTOMY      removed both ovaries and tubes    revision of hysterectomyl suture line      REVISION, BLEB, SCLERAL PATCH GRAFT Left 05/25/2019    Procedure: REVISION, BLEB;  Surgeon: Lamount Cranker, MD;  Location: [facility];  Service: Ophthalmology;  Laterality: Left;    TONSILLECTOMY      TRABECULECTOMY Left 03/22/2014    Procedure: TRABECULECTOMY;  Surgeon: Pecola Leisure, MD;  Location:  MAIN OR;  Service: Ophthalmology;  Laterality: Left;  TRABECULECTOMY REVISION, LEFT EYE  Q1=N  ANES=MAC  EQUIP=MITOMYCIN C, 0.4mg /cc, DISPENSE 5cc (ORDER TO FOLLOW)  MD REQ 30 MIN  ALLERGIES=COSOPT, AZOPT, ALPHAGAN  **Need RESIGHT SET from SPD    ureterectomy left         Family History:     Family History   Problem Relation Age of Onset    Hypertension Father     Vision loss Father     Vision loss Brother     Hypertension Paternal Aunt     Vision loss Paternal Uncle        Social History:     Social History     Socioeconomic History    Marital status: Married     Spouse name: Not on file    Number of children: Not on file    Years of education: Not on file    Highest education level: Not on file   Occupational History    Not on file   Tobacco Use    Smoking status: Former     Current packs/day: 0.00     Types: Cigarettes     Quit date: 12/21/2010     Years since quitting: 12.0    Smokeless tobacco: Never   Vaping Use    Vaping status: Never Used   Substance and Sexual Activity    Alcohol use:  Yes     Alcohol/week: 1.0 standard drink of alcohol     Types: 1 Cans of beer per week     Comment: 1/week    Drug use: No    Sexual activity: Yes     Partners: Male     Birth control/protection: Surgical   Other Topics Concern    Not on file   Social History Narrative    Not on file     Social Determinants of Health     Financial Resource Strain: Low Risk  (01/14/2023)    Overall Financial Resource Strain (CARDIA)     Difficulty of Paying Living Expenses: Not hard at all   Food Insecurity: No Food Insecurity (01/13/2023)    Hunger Vital Sign     Worried About Running Out of Food in the Last Year: Never true     Ran Out of Food in the  Last Year: Never true   Transportation Needs: No Transportation Needs (01/14/2023)    PRAPARE - Therapist, art (Medical): No     Lack of Transportation (Non-Medical): No   Physical Activity: Not on file   Stress: Not on file   Social Connections: Not on file   Intimate Partner Violence: Not At Risk (01/13/2023)    Humiliation, Afraid, Rape, and Kick questionnaire     Fear of Current or Ex-Partner: No     Emotionally Abused: No     Physically Abused: No     Sexually Abused: No   Housing Stability: Low Risk  (01/14/2023)    Housing Stability Vital Sign     Unable to Pay for Housing in the Last Year: No     Number of Times Moved in the Last Year: 1     Homeless in the Last Year: No         REVIEW OF SYSTEMS REVIEWED:   YES  ( x )        HOME MEDICATIONS     Prior to Admission medications    Medication Sig Start Date End Date Taking? Authorizing Provider   desloratadine (CLARINEX) 5 MG tablet Take 1 tablet (5 mg) by mouth daily   Yes [provider]   dexlansoprazole (Dexilant) 60 MG capsule Take 1 capsule (60 mg) by mouth daily   Yes [provider]   ergocalciferol (ERGOCALCIFEROL) 1.25 MG (50000 UT) capsule 1 capsule (50,000 Units)   Yes [provider]   metoprolol (LOPRESSOR) 100 MG tablet Take 0.5 tablets (50 mg) by mouth 2 (two) times  daily   Yes [provider]   rivaroxaban (XARELTO) 10 MG Tab Take 1 tablet (10 mg) by mouth daily   Yes [provider]   timolol (TIMOPTIC) 0.5 % ophthalmic solution Place 1 drop into the left eye daily 03/15/20  Yes [provider]   acetaminophen-codeine (TYLENOL #3) 300-30 MG per tablet Take 1 tablet by mouth every 6 (six) hours as needed for Pain 12/08/22   Ilean China B II, DO   hydrocortisone 2.5 % cream APPLY TO AFFECTED AREA TWICE DAILY 10/31/18   [provider]         INPATIENT MEDICATIONS      Current Facility-Administered Medications   Medication Dose Route Frequency    lidocaine  1 patch Transdermal Q24H    methocarbamol  500 mg Oral QID    metoprolol tartrate  50 mg Oral Q12H SCH    pantoprazole  40 mg Oral Daily    timolol  1 drop Left Eye Daily       acetaminophen, calcium carbonate, cetirizine, diazePAM, hydrALAZINE, loperamide, magnesium sulfate, melatonin, morphine, naloxone, ondansetron **OR** ondansetron, oxyCODONE, potassium chloride **OR** potassium chloride, senna-docusate      ALLERGIES:   Allergies[2]      PREVIOUS REACTION TO SEDATION MEDICATIONS     NO ( x )   YES (  )      PHYSICAL EXAM     AIRWAY CLASSIFICATION:    CLASS I   (  )   CLASS II  ( x )    CLASS III  (  )     CLASS IV  (  )    INTUBATED (  )    CARDIAC :   RRR      LUNGS:   NLR      LABS:     Lab  Results   Component Value Date/Time    WBC 6.99 01/16/2023 01:04 AM    WBC 7.20 01/12/2020 02:15 PM    HCT 32.4 (L) 01/16/2023 01:04 AM    HCT 33.3 (L) 01/12/2020 02:15 PM    PLT 245 01/16/2023 01:04 AM    PLT 286 01/12/2020 02:15 PM    INR 1.0 01/14/2023 03:30 AM    INR 1.4 (H) 01/02/2011 07:00 AM    PT 10.8 01/14/2023 03:30 AM    PT 16.7 (H) 01/02/2011 07:00 AM    PTT 25 (L) 01/14/2023 03:30 AM    PTT 59 (H) 12/31/2010 12:16 AM    BUN 8 01/16/2023 01:04 AM    BUN 9 01/12/2020 02:15 PM    CREAT 0.6 01/16/2023 01:04 AM    CREAT 0.8 01/12/2020 02:15 PM    GLU 81 01/16/2023 01:04 AM    GLU 103 (H)  01/12/2020 02:15 PM    K 4.1 01/16/2023 01:04 AM    K 4.6 01/12/2020 02:15 PM           ASA PHYSICAL STATUS   (  )  ASA 1   HEALTHY PATIENT  (  )  ASA 2   MILD SYSTEMIC ILLNESS  ( x )  ASA 3   SYSTEMIC DISEASE, NOT INCAPACITATING  (  )  ASA 4   SEVERE SYSTEMIC DISEASE, DISEASE IS CONSTANT THREAT TO LIFE  (  )  ASA 5   MORIBUND CONDITION, NOT EXPECTED TO LIVE >24 HOURS            IRRESPECTIVE OF PROCEDURE  (  )  E           EMERGENCY PROCEDURE       PLANNED SEDATION:   (  ) NO SEDATION  ( x ) MODERATE SEDATION  (  ) DEEP SEDATION WITH ANESTHESIA      CONCLUSION:   PATIENT HAS BEEN REASSESSED IMMEDIATELY PRIOR TO THE PROCEDURE   AND IS AN APPROPRIATE CANDIDATE FOR THE PLANNED SEDATION AND   PROCEDURE.  RISKS, BENEFITS AND ALTERNATIVES TO THE PLANNED   PROCEDURE AND SEDATION HAVE BEEN EXPLAINED TO THE PATIENT   OR GUARDIAN.    ( x )  YES  (  )  EMERGENCY CONSENT       Signed by: Tamala Bari, MD  [facility]-Section of Vascular & Interventional Radiology  Contact Numbers:  Regular business hours (8A-5P M-F):  [facility]:  [phone] (option 3-outpatient scheduling, option 4-consults, option 5-inpatient procedures)  [facility]:  [phone]  [facility]: [phone]  After hours/Answering service:  [phone]         [1]   Past Medical History:  Diagnosis Date    Chronic ulcerative colitis     nio issues for long time    Claustrophobia     Clotting disorder     DVT LLE 2012    Deep venous thrombosis of calf 2012    LLL/on xeralto//    GERD (gastroesophageal reflux disease)     Glaucoma NEC     bilat    Hypertensive disorder     controlled well with medication    Ovarian cyst, right     Plantar fasciitis     Seasonal allergies    [2]   Allergies  Allergen Reactions    Latex Itching

## 2023-01-16 NOTE — PT Progress Note (Signed)
Physical Therapy Note  [facility]   Physical Therapy Cancellation Note    Patient:  [name] MRN#:  [id number]  [facility] Room/Bed:  HVICAR/HVICAR    01/16/2023  Time: 12:45 PM       PT Cancellation: Visit  PT Visit Cancellation Reason: Testing/Procedure (Paitent off the floor for IVC filter)       Eustace Pen, PT, DPT   Pager [phone]

## 2023-01-16 NOTE — Plan of Care (Signed)
Problem: Moderate/High Fall Risk Score >5  Goal: Patient will remain free of falls  Outcome: Progressing  Flowsheets (Taken 01/16/2023 0800)  High (Greater than 13):   HIGH-Visual cue at entrance to patient's room   HIGH-Bed alarm on at all times while patient in bed   HIGH-Initiate use of floor mats as appropriate     Problem: Pain interferes with ability to perform ADL  Goal: Pain at adequate level as identified by patient  Outcome: Progressing  Flowsheets (Taken 01/16/2023 1705)  Pain at adequate level as identified by patient:   Assess pain on admission, during daily assessment and/or before any "as needed" intervention(s)   Reassess pain within 30-60 minutes of any procedure/intervention, per Pain Assessment, Intervention, Reassessment (AIR) Cycle   Evaluate if patient comfort function goal is met   Identify patient comfort function goal   Offer non-pharmacological pain management interventions   Evaluate patient's satisfaction with pain management progress     Problem: Impaired Mobility  Goal: Mobility/Activity is maintained at optimal level for patient  Outcome: Progressing  Flowsheets (Taken 01/16/2023 1705)  Mobility/activity is maintained at optimal level for patient: Encourage independent activity per ability     Problem: Peripheral Neurovascular Impairment  Goal: Extremity color, movement, sensation are maintained or improved  Outcome: Progressing  Flowsheets (Taken 01/16/2023 1705)  Extremity color, movement, sensation are maintained or improved:   Assess and monitor application of corrective devices (cast, brace, splint), check skin integrity   Assess extremity for proper alignment     Problem: Compromised skin integrity  Goal: Skin integrity is maintained or improved  Outcome: Progressing  Flowsheets (Taken 01/16/2023 1705)  Skin integrity is maintained or improved:   Assess Braden Scale every shift   Turn or reposition patient every 2 hours or as needed unless able to reposition self   Increase activity  as tolerated/progressive mobility   Relieve pressure to bony prominences   Avoid shearing   Encourage use of lotion/moisturizer on skin   Keep skin clean and dry   Collaborate with Wound, Ostomy, and Continence Nurse   Keep head of bed 30 degrees or less (unless contraindicated)     Problem: Neurological Deficit  Goal: Neurological status is stable or improving  Outcome: Progressing  Flowsheets (Taken 01/16/2023 1706)  Neurological status is stable or improving:   Monitor/assess/document neurological assessment (Stroke: every 4 hours)   Perform CAM Assessment     Problem: Compromised Moisture  Goal: Moisture level Interventions  Outcome: Progressing  Flowsheets (Taken 01/16/2023 0800)  Moisture level Interventions: Moisture wicking products, Moisture barrier cream     Problem: Compromised Activity/Mobility  Goal: Activity/Mobility Interventions  Outcome: Progressing  Flowsheets (Taken 01/16/2023 0800)  Activity/Mobility Interventions: Pad bony prominences, TAP Seated positioning system when OOB, Promote PMP, Reposition q 2 hrs / turn clock, Offload heels     Problem: Compromised Friction/Shear  Goal: Friction and Shear Interventions  Outcome: Progressing  Flowsheets (Taken 01/16/2023 0800)  Friction and Shear Interventions: Pad bony prominences, Off load heels, HOB 30 degrees or less unless contraindicated, Consider: TAP seated positioning, Heel foams

## 2023-01-16 NOTE — Sedation Documentation (Signed)
Temporary IVC filter placement completed without complication.     Patient tolerated procedure well. VS stable.     Medications given:    2 Mg IV versed   100 mcg IV fentanyl     No bleeding or hematoma noted to access site.     Puncture site dressed with gauze and tegaderm.    Patient denies pain. Report called to receiving RN, Ana.     To be transported to NT 726 via stretcher.

## 2023-01-16 NOTE — Brief Op Note (Signed)
BRIEF IR PROCEDURE NOTE    Date Time: 01/16/23 2:03 PM    Patient Name:   [name]    Date of Operation:   01/16/2023    Providers Performing:   Surgeon(s):  Hollyn Stucky, Loren Racer, MD    Assistant (s): RT    Operative Procedure:   Procedure(s):  Temporary IVC FILTER PLACEMENT    Preoperative Diagnosis:   Pre-Op Diagnosis Codes:      * Clot [I82.90]    Postoperative Diagnosis:   Clot [I82.90]    Anesthesia:   ( x ) FENTANYL ( x ) VERSED ( x ) LOCAL  (  ) GENERAL ANESTHESIA (DEPT OF ANESTHESIOLOGY) )    Estimated Blood Loss:   0  Findings:   IVCF placement, bard denali    Complications:   none      Signed by: Tamala Bari, MD, MD                                                                              [facility]

## 2023-01-16 NOTE — Discharge Instr - AVS First Page (Addendum)
Interventional Radiology  Discharge Instructions for Inferior Vena Cava (IVC) Filter Placement    You had a filter placed in a large vein called the inferior vena cava.  This vein connects your leg veins to your heart.  The filter was placed to keep blood clots from going to your heart and lungs.                             Activity:  No driving for 24 hours following the procedure if you received medication for sedation during the procedure.  Avoid alcohol for the next 24 hours if you received medication for sedation during the procedure.  No heavy lifting (greater than 10 pounds) for 5-7 days.  This includes pushing, pulling, dragging, or moving anything greater than 10 pounds for 5-7 days.  No strenuous activity or exercising (such as bicycling, jogging, shoveling snow, chopping wood, etc), bending at the waist or touching toes for 24 hours following the procedure.    Do not attempt anything that may cause fatigue, shortness of breath, perspiration, or chest pain.   Rest today and tomorrow, then gradually increase to your usual activities.  Limit your activity to staying in a bed/couch with hip extended until tomorrow morning if the access site was in your groin.  Limit stair usage for the next 24 hours if the access site was in your groin. If you must use the stairs, take them one at a time, leading with your unaffected leg holding pressure on the bandaged site.  Drink 6-8 glasses of water daily for at least 48 hours to help flush your body of the dye used during the procedure.    Access Site Care:  No tub baths, hot tubs, pools or sitting in water for one week or until the puncture site is completely healed.  You may shower 24 hours after your procedure.  REMOVE the dressing either before or during your shower. Wash gently with soap, then pat the area dry.   You may remove your dressing after 24 hours.  Do not rub, pick or scratch the area.   Do not apply creams, powders, lotions, or ointments to the site  for 1 week.   Observe for signs of infection:  redness, warmth, swelling, drainage, or temperature greater than 101 degrees F.  If you suspect infection, call the doctor who performed the procedure.    Medication: Continue blood thinners as ordered by your doctor.    Normal Observation:  Local site tenderness, which may last up to 1 week.  You may take Acetaminophen (Tylenol) if needed.  You may experience some mild bruising, which may last up to 2 weeks    When to call the doctor:  Bleeding or hematoma at the puncture site: Lay down flat then apply firm pressure at the site and call your doctor or the Interventional Radiologist  If the groin was the access site and you notice the leg becomes cold, numb, painful, pale or grayish in color, or change from your usual color/sensation.     When to follow up:  You will need to return for follow up to have a discussion of IVC filter removal in 3 months. The IVC filter that was placed was temporary, and must be removed in 3-6 months. It is very important that you keep your follow up appointment.  If a permanent filter is required in the future, the temporary filter can be removed and replaced with  a permanent IVC filter. Date and timing of clinic follow up will be placed in the your chart in the discharge instructions.    If you have questions or concerns, please call an Interventional Radiologist:    Contact Numbers:    Regular business hours (8A-5P M-F):  [facility]:  [phone] option 3  [facility]:  [phone]  [facility]: [phone]    After hours:  Answering service:  [phone]                       [facility] - Neurosurgery  [address]  [place], [place] [address]  Phone: [phone] and Fax: [phone]    NEUROSURGERY DISCHARGE INSTRUCTIONS AFTER NON-OPERATIVE SPINAL INJURY:    Neurosurgeon: Dr. Chauncy Lean    -You will need a follow-up in 6 weeks with Tawni Millers NP.       IMAGING:  -You will  need a repeat xray of the spine, to be completed 1-5 days prior to your appointment with the neurosurgeon.  -Please Call [facility] [phone] or [facility] to schedule your imaging [phone].     BRACE:  Wear The TLSO brace when out of bed, when head of bed greater than 30 degrees, sitting upright or when out of bed.  May remove TLSO for brief period of time for skin care or shower only, however, avoid bending, lifting or twisting.  Wear your Cervical collar at all times if you have been instructed to wear a neck brace.    Instructions for Back/Neck fracture without surgery    EXPECTATIONS:  Call us if the symptoms suddenly worsen.     MEDICATIONS:  Avoid taking nicotine, steroids, NSAIDs / Aleve / Ibuprofen / Toradol /celebrex in the first 12 weeks after your spine fracture.  Take Tylenol for pain.    SMOKING:  Smoking delays the healing process; we request that you avoid smoking if possible    ACTIVITY:  No bending or lifting anything greater than about 5 lbs. It is very important to get up and moving at home, but it is equally important to not overdo activities. Plan to have some assistance at home for the first few days. You will be able to walk, take stairs (carefully and slowly), use the bathroom, and get up from sitting to standing using the arms of the chair as support. Slowly increase your activity back to baseline. For example, increase from a short, less than 5 minute walk the first few days, to around the block and up to 15-20 minutes after a week. Do not bend, twist or lean to pick things up or change positions. Remember good back mechanics and use your legs to sit down and stand up.  Less movement of the fractured bone allows for  greater chance of healing.  Activities that keep spine in good alignment and minimize movement of the fractured bone is the recommendation at this time.    PHYSICAL THERAPY:  We will discuss PT at your first follow up visit. You will not  begin therapy until after your first follow up appointment.    SEXUAL ACTIVITY:  Sexual activity is not advised until you have followed up with [place] in the clinic.     DRIVING:  If you had a fracture in your back, you may resume driving after 1 week. For neck fracture you need to wait until you have followed up with the clinic and are  out of the cervical collar.     WORK:  Do not return to work until you have been advised to do so by staff. Generally we recommend taking 4 weeks off of work for recovery, but each individual situation will be reviewed independently.    HOUSEWORK:  Avoid vacuuming or laundry until you have followed up in the clinic. The bending and lifting motion can place pressure on your back and neck    PROBLEMS or CONCERNS:  Please feel free to call the office at any time for problems, concerns, or questions you may have. We are always available to help you or answer a question. In an emergency, please call 911 or go to the nearest Emergency Department.    Our office is open Monday-Friday 0800-4:30 pm. If you contact the office after 4:30 pm you will be directed to the after-hours service, which will be an inpatient neurosurgery resident physician or physician assistant.

## 2023-01-16 NOTE — Progress Notes (Signed)
4 eyes in 4 hours pressure injury assessment note:      Completed with: Yasmin Ahmed  Unit & Time admitted: NT8, 18:30             Bony Prominences: Check appropriate box; if wound is present enter wound assessment in LDA     Occiput:                 [x] WNL  []  Wound present  Face:                     [x] WNL  []  Wound present  Ears:                      [x] WNL  []  Wound present  Spine:                    [x] WNL  []  Wound present  Shoulders:             [x] WNL  []  Wound present  Elbows:                  [x] WNL  []  Wound present  Sacrum/coccyx:     [] WNL  [x]  Wound present   Ischial Tuberosity:  [x] WNL  []  Wound present  Trochanter/Hip:      [x] WNL  []  Wound present  Knees:                   [x] WNL  []  Wound present  Ankles:                   [x] WNL  []  Wound present  Heels:                    [] WNL  [x]  Wound present - left heel blanchable redness  Other pressure areas:  []  Wound location       Device related: []  Device name:         LDA completed if wound present: n/a  Consult WOCN if necessary    Other skin related issues, ie tears, rash, etc, document in Integumentary flowsheet

## 2023-01-16 NOTE — Sedation Documentation (Signed)
Patient arrived to IR procedure room for temp IVC filter placement.     Patient [id number]. Informed consent for procedure signed in chart.     Patient transferred with assistance to procedure table to supine position.     Monitors attached. Nasal canula applied. Vitals per protocol. Padded arm boards in place. Lawyer in place.     Draped and prepped in sterile fashion by Sam.

## 2023-01-17 ENCOUNTER — Inpatient Hospital Stay: Payer: BLUE CROSS/BLUE SHIELD

## 2023-01-17 MED ORDER — IOHEXOL 350 MG/ML IV SOLN
100.0000 mL | Freq: Once | INTRAVENOUS | Status: AC | PRN
Start: 2023-01-17 — End: 2023-01-17
  Administered 2023-01-17: 100 mL via INTRAVENOUS

## 2023-01-17 MED ORDER — VITAMIN D (ERGOCALCIFEROL) 1.25 MG (50000 UT) PO CAPS
50000.0000 [IU] | ORAL_CAPSULE | ORAL | Status: DC
Start: 2023-01-17 — End: 2023-01-22
  Administered 2023-01-17: 50000 [IU] via ORAL
  Filled 2023-01-17: qty 1

## 2023-01-17 MED ORDER — TAB-A-VITE/BETA CAROTENE PO TABS
1.0000 | ORAL_TABLET | Freq: Every day | ORAL | Status: DC
Start: 2023-01-17 — End: 2023-01-22
  Administered 2023-01-17 – 2023-01-22 (×6): 1 via ORAL
  Filled 2023-01-17 (×6): qty 1

## 2023-01-17 NOTE — Progress Notes (Signed)
Progress note:   Assumed care of patient. Pt A&O X3-4 Impulsive. VSS. No SOB. Lying in bed.  Patient able to ambulate with the aide of walker x1 SBA.  Incontinent of bowel and bladder. LBM 7/28. Pt voiding via BSC and BR.  Good pedal pulses bilaterally,cap refill <3 sec in all 4 extremities.  Denies Nausea and vomiting.  Encouraged TCDB and IS use.   Will continue hourly rounding and maintain safety, call light remains within reach. Pt utilizes this to express her needs.     Pain controlled with tylenol.      Most Recent Set of Vitals   Visit Vitals  BP (!) 139/94   Pulse 77   Temp 98.1 F (36.7 C)   Resp 18   Ht 1.575 m (5' 2.01")   Wt 52 kg (114 lb 10.2 oz)   SpO2 99%   BMI 20.96 kg/m             Procedure(s):  Temporary IVC FILTER PLACEMENT    2 Days Post-Op  -------------------     Patient Lines/Drains/Airways Status       Active Lines, Drains and Airways       Name Placement date Placement time Site Days    Peripheral IV 01/17/23 20 G Anterior;Left Forearm 01/17/23  1431  Forearm  less than 1    Venous Sheath 8 Fr. Right Femoral 01/16/23  1357  Femoral  1                    Notable Shift Event    >>>Patient is trying to get out of the bed. Avasys in placed.    >>>Sacral wound dressing change with triad and covered with mepelex.    Azucena Fallen, RN  01/18/23  5:13 AM

## 2023-01-17 NOTE — Plan of Care (Signed)
Problem: Moderate/High Fall Risk Score >5  Goal: Patient will remain free of falls  Outcome: Progressing  Flowsheets (Taken 01/17/2023 1942)  High (Greater than 13):   HIGH-Visual cue at entrance to patient's room   HIGH-Utilize chair pad alarm for patient while in the chair   HIGH-Consider use of low bed   HIGH-Bed alarm on at all times while patient in bed   HIGH-Apply yellow "Fall Risk" arm band   HIGH-Initiate use of floor mats as appropriate     Problem: Pain interferes with ability to perform ADL  Goal: Pain at adequate level as identified by patient  Outcome: Progressing  Flowsheets (Taken 01/17/2023 1942)  Pain at adequate level as identified by patient:   Identify patient comfort function goal   Assess for risk of opioid induced respiratory depression, including snoring/sleep apnea. Alert healthcare team of risk factors identified.   Assess pain on admission, during daily assessment and/or before any "as needed" intervention(s)   Evaluate if patient comfort function goal is met   Reassess pain within 30-60 minutes of any procedure/intervention, per Pain Assessment, Intervention, Reassessment (AIR) Cycle   Offer non-pharmacological pain management interventions   Evaluate patient's satisfaction with pain management progress   Consult/collaborate with Physical Therapy, Occupational Therapy, and/or Speech Therapy     Problem: Side Effects from Pain Analgesia  Goal: Patient will experience minimal side effects of analgesic therapy  Outcome: Progressing  Flowsheets (Taken 01/17/2023 1942)  Patient will experience minimal side effects of analgesic therapy:   Monitor/assess patient's respiratory status (RR depth, effort, breath sounds)   Assess for changes in cognitive function   Prevent/manage side effects per LIP orders (i.e. nausea, vomiting, pruritus, constipation, urinary retention, etc.)   Evaluate for opioid-induced sedation with appropriate assessment tool (i.e. POSS)     Problem: Impaired Mobility  Goal:  Mobility/Activity is maintained at optimal level for patient  Outcome: Progressing  Flowsheets (Taken 01/17/2023 1942)  Mobility/activity is maintained at optimal level for patient: Encourage independent activity per ability     Problem: Peripheral Neurovascular Impairment  Goal: Extremity color, movement, sensation are maintained or improved  Outcome: Progressing  Flowsheets (Taken 01/17/2023 1942)  Extremity color, movement, sensation are maintained or improved:   Assess extremity for proper alignment   Assess and monitor application of corrective devices (cast, brace, splint), check skin integrity     Problem: Compromised skin integrity  Goal: Skin integrity is maintained or improved  Outcome: Progressing  Flowsheets (Taken 01/17/2023 1942)  Skin integrity is maintained or improved:   Assess Braden Scale every shift   Turn or reposition patient every 2 hours or as needed unless able to reposition self   Increase activity as tolerated/progressive mobility   Relieve pressure to bony prominences   Avoid shearing   Keep skin clean and dry     Problem: Neurological Deficit  Goal: Neurological status is stable or improving  Outcome: Progressing  Flowsheets (Taken 01/17/2023 1942)  Neurological status is stable or improving:   Monitor/assess/document neurological assessment (Stroke: every 4 hours)   Perform CAM Assessment     Problem: Potential for Aspiration  Goal: Risk of aspiration will be minimized  Outcome: Progressing  Flowsheets (Taken 01/17/2023 1942)  Risk of aspiration will be minimized:   Assess/monitor ability to swallow using dysphagia screen: Keep patient NPO if patient fails screening   Place swallow precaution signage above bed and supervise patient during oral intake   Monitor/assess for signs of aspiration (tachypnea, cough, wheezing, clearing throat, hoarseness  after eating, decrease in SaO2)   Consult/collaborate/follow recommended modified texture diet/thicken liquids as indicated by Speech Pathologist    Place patient up in chair to eat, if possible/head of bed up 90 degrees to eat if unable to be out of bed     Problem: Compromised Sensory Perception  Goal: Sensory Perception Interventions  Outcome: Progressing  Flowsheets (Taken 01/17/2023 1942)  Sensory Perception Interventions: Offload heels, Pad bony prominences, Reposition q 2hrs/turn Clock, Q2 hour skin assessment under devices if present     Problem: Compromised Moisture  Goal: Moisture level Interventions  Outcome: Progressing  Flowsheets (Taken 01/17/2023 1942)  Moisture level Interventions: Moisture wicking products, Moisture barrier cream     Problem: Compromised Activity/Mobility  Goal: Activity/Mobility Interventions  Outcome: Progressing  Flowsheets (Taken 01/17/2023 1942)  Activity/Mobility Interventions: Pad bony prominences, TAP Seated positioning system when OOB, Promote PMP, Reposition q 2 hrs / turn clock, Offload heels     Problem: Compromised Nutrition  Goal: Nutrition Interventions  Outcome: Progressing  Flowsheets (Taken 01/17/2023 1942)  Nutrition Interventions: Discuss nutrition at Rounds, I&Os, Document % meal eaten, Daily weights     Problem: Compromised Friction/Shear  Goal: Friction and Shear Interventions  Outcome: Progressing  Flowsheets (Taken 01/17/2023 1942)  Friction and Shear Interventions: Pad bony prominences, Off load heels, HOB 30 degrees or less unless contraindicated, Consider: TAP seated positioning, Heel foams

## 2023-01-17 NOTE — Plan of Care (Signed)
Procedure(s):  Temporary IVC FILTER PLACEMENT    1 Day Post-Op  -------------------     Patient Lines/Drains/Airways Status       Active Lines, Drains and Airways       Name Placement date Placement time Site Days    Peripheral IV 01/17/23 20 G Anterior;Left Forearm 01/17/23  1431  Forearm  less than 1    External Urinary Catheter 01/15/23  2100  --  1    Venous Sheath 8 Fr. Right Femoral 01/16/23  1357  Femoral  1                  Pt A&O X4. VSS.  Tolerated crushed pills with applesauce.  Pt able to ambulate to bathroom with TLSO brace and walker as well as reposition self in bed with 1 person assistance.   Encouraged TCDB and IS use.   Continent of bowel and bladder. LBM 7/28. Pt voiding via bathroom.  Pain Controlled with robaxin.  Good pedal pulses bilaterally,cap refill <3 sec in all 4 extremities.  All dressings are clean, dry, and intact and all sites appear free from visual signs of infection.    Call bell and bedside table within reach at all times. Will continue to monitor patient closely.     Notable Shift Events  Sacral wound dressing change with triad cream and mepelex, multiple bathroom trips, PT, CT scan, new IV, 1 dose valium before CT scan    Most Recent Set of Vitals   Visit Vitals  BP 129/83   Pulse 79   Temp 97.7 F (36.5 C) (Oral)   Resp 18   Ht 1.575 m (5' 2.01")   Wt 52 kg (114 lb 10.2 oz)   SpO2 99%   BMI 20.96 kg/m           Verner Chol, RN  01/17/23  6:52 PM   Problem: Moderate/High Fall Risk Score >5  Goal: Patient will remain free of falls  Outcome: Progressing  Flowsheets  Taken 01/16/2023 2037 by Azucena Fallen., RN  High (Greater than 13):   HIGH-Visual cue at entrance to patient's room   HIGH-Consider use of low bed   HIGH-Bed alarm on at all times while patient in bed   HIGH-Apply yellow "Fall Risk" arm band   HIGH-Initiate use of floor mats as appropriate  Taken 01/14/2023 0012 by Billey Chang, RN  VH High Risk (Greater than 13):   ALL REQUIRED MODERATE INTERVENTIONS   ALL  REQUIRED LOW INTERVENTIONS   RED "HIGH FALL RISK" SIGNAGE   BED ALARM WILL BE ACTIVATED WHEN THE PATEINT IS IN BED WITH SIGNAGE "RESET BED ALARM"   A CHAIR PAD ALARM WILL BE USED WHEN PATIENT IS UP SITTING IN A CHAIR   PATIENT IS TO BE SUPERVISED FOR ALL TOILETING ACTIVITIES     Problem: Pain interferes with ability to perform ADL  Goal: Pain at adequate level as identified by patient  Outcome: Progressing  Flowsheets (Taken 01/16/2023 2037 by Azucena Fallen., RN)  Pain at adequate level as identified by patient:   Identify patient comfort function goal   Assess for risk of opioid induced respiratory depression, including snoring/sleep apnea. Alert healthcare team of risk factors identified.   Assess pain on admission, during daily assessment and/or before any "as needed" intervention(s)   Reassess pain within 30-60 minutes of any procedure/intervention, per Pain Assessment, Intervention, Reassessment (AIR) Cycle   Evaluate patient's satisfaction with pain management progress   Evaluate  if patient comfort function goal is met   Offer non-pharmacological pain management interventions     Problem: Side Effects from Pain Analgesia  Goal: Patient will experience minimal side effects of analgesic therapy  Outcome: Progressing  Flowsheets (Taken 01/16/2023 2037 by Azucena Fallen., RN)  Patient will experience minimal side effects of analgesic therapy:   Prevent/manage side effects per LIP orders (i.e. nausea, vomiting, pruritus, constipation, urinary retention, etc.)   Evaluate for opioid-induced sedation with appropriate assessment tool (i.e. POSS)   Monitor/assess patient's respiratory status (RR depth, effort, breath sounds)   Assess for changes in cognitive function     Problem: Impaired Mobility  Goal: Mobility/Activity is maintained at optimal level for patient  Outcome: Progressing  Flowsheets (Taken 01/16/2023 2037 by Azucena Fallen., RN)  Mobility/activity is maintained at optimal level for  patient: Encourage independent activity per ability     Problem: Peripheral Neurovascular Impairment  Goal: Extremity color, movement, sensation are maintained or improved  Outcome: Progressing  Flowsheets (Taken 01/16/2023 2037 by Azucena Fallen., RN)  Extremity color, movement, sensation are maintained or improved:   Assess extremity for proper alignment   Assess and monitor application of corrective devices (cast, brace, splint), check skin integrity     Problem: Compromised skin integrity  Goal: Skin integrity is maintained or improved  Outcome: Progressing  Flowsheets (Taken 01/16/2023 2037 by Azucena Fallen., RN)  Skin integrity is maintained or improved:   Assess Braden Scale every shift   Relieve pressure to bony prominences   Avoid shearing   Keep skin clean and dry   Encourage use of lotion/moisturizer on skin   Monitor patient's hygiene practices   Increase activity as tolerated/progressive mobility     Problem: Nutrition  Goal: Nutritional intake is adequate  Outcome: Progressing  Flowsheets (Taken 01/16/2023 2037 by Azucena Fallen., RN)  Nutritional intake is adequate: Consult/collaborate with Clinical Nutritionist     Problem: Neurological Deficit  Goal: Neurological status is stable or improving  Outcome: Progressing  Flowsheets (Taken 01/16/2023 2037 by Azucena Fallen., RN)  Neurological status is stable or improving:   Monitor/assess/document neurological assessment (Stroke: every 4 hours)   Perform CAM Assessment     Problem: Potential for Aspiration  Goal: Risk of aspiration will be minimized  Outcome: Progressing  Flowsheets (Taken 01/14/2023 0012 by Billey Chang, RN)  Risk of aspiration will be minimized:   Assess/monitor ability to swallow using dysphagia screen: Keep patient NPO if patient fails screening   Place swallow precaution signage above bed and supervise patient during oral intake     Problem: Communication Impairment  Goal: Will be able to express needs and  understand communication  Outcome: Progressing  Flowsheets (Taken 01/14/2023 0012 by Billey Chang, RN)  Able to express needs and understand communication:   Provide alternative method of communication if needed   Consult/collaborate with Case Management/Social Work   Consult/collaborate with Speech Language Pathology (SLP)   Include patient care companion in decisions related to communication     Problem: Compromised Sensory Perception  Goal: Sensory Perception Interventions  Outcome: Progressing  Flowsheets (Taken 01/17/2023 0820)  Sensory Perception Interventions: Offload heels, Pad bony prominences, Reposition q 2hrs/turn Clock, Q2 hour skin assessment under devices if present     Problem: Compromised Moisture  Goal: Moisture level Interventions  Outcome: Progressing  Flowsheets (Taken 01/17/2023 0820)  Moisture level Interventions: Moisture wicking products, Moisture barrier cream     Problem: Compromised  Activity/Mobility  Goal: Activity/Mobility Interventions  Outcome: Progressing  Flowsheets (Taken 01/17/2023 0820)  Activity/Mobility Interventions: Pad bony prominences, TAP Seated positioning system when OOB, Promote PMP, Reposition q 2 hrs / turn clock, Offload heels     Problem: Compromised Nutrition  Goal: Nutrition Interventions  Outcome: Progressing  Flowsheets (Taken 01/17/2023 0820)  Nutrition Interventions: Discuss nutrition at Rounds, I&Os, Document % meal eaten, Daily weights     Problem: Compromised Friction/Shear  Goal: Friction and Shear Interventions  Outcome: Progressing  Flowsheets (Taken 01/17/2023 0820)  Friction and Shear Interventions: Pad bony prominences, Off load heels, HOB 30 degrees or less unless contraindicated, Consider: TAP seated positioning, Heel foams

## 2023-01-17 NOTE — Progress Notes (Signed)
[facility] HOSPITALIST PROGRESS NOTE    Date Time: 01/17/23 3:58 PM  Patient Name: [name]  Attending Physician: Frederich Cha, DO      Assessment   62 y.o. female hx DVT 2012, prior ulcerative colitis, HTN who presents with bilateral leg weakness, urinary incontinence likely secondary to 6 mm fluid collection at the T12 vertebrae extending to the mid inferior T11 and mid L1 indenting the anterior thecal sac likely from a small hematoma.      Plan   #Back pain from an acute T10 vertebral fracture with 2 mm retropulsion  #Bilateral leg weakness, urinary incontinence likely from focal fluid collection measuring 6 mm extending from the inferior T11 to mid L1 level causing indentation of the anterior thecal sac displacing the thecal sac likely from an epidural small hematoma  #Subacute T11 vertebral fracture, and subacute T12 vertebral fracture  #Old L1-L3 compression fractures  -Transfer patient to the neuro [facility]  -Neurochecks every 2 hours  -Fall, aspiration, delirium precautions  -Analgesia CK normal.   -CT lumbar spine suggested L3-L4 canal stenosis. It also mentioned T12 fracture new since 01/20/21 but likely chronic.   -MRI lumbar spine w/wo contrast reviewed.  -Neurosurgery was consulted for vertebral fractures and possible epidural small hematoma at the T12 level.  -No NSAIDs, anticoagulation, or antiplatelet therapy.  -TLSO back brace when OOB  -Upright x-rays of the thoracic spine reviewed.  Limited visualization.  Multilevel compression fractures.  Bones are relatively demineralized in appearance.  -Has poor dentition therefore I want dental clearance prior to starting on bone health therapy (bisphosphonates or Prolia).  -Patient's last dose of Xarelto was on 01/07/2023 or 01/08/2023.  -Appreciate neurosurgery input  -OT/PT home with HHPT    #Acute left LE DVT  #Hx DVT 2012, prior ulcerative colitis, HTN - Noted.   -Patient was cleared to restart Xarelto 10 mg p.o. nightly on 01/19/2023 by  neurosurgery.  -Hematology recommends bridging with heparin gtt to lovenox or eliquis.    -Consider Eliquis 5mg  twice daily instead of Xarelto   -Need to discuss with hematology prior to initiation of AC  -Venous Doppler ultrasound of bilateral lower extremities  1. Nonocclusive thrombus within the right popliteal vein.  2. Thrombus within the left common femoral, greater saphenous, femoral,  popliteal, and gastrocnemius veins as described.  -Plan for temporary IVC filter   -Hematology input appreciated    #Unintentional weight loss   #former smoker  -CT CAP r/o malignancy    DVT prophy: SCDs. Lovenox.   Diet: regular  Code status: full  Status: inpatient                  Recent Labs   Lab 01/16/23  0104 01/15/23  0328 01/14/23  0330   Hemoglobin 11.1* 12.4 11.1*   Hematocrit 32.4* 36.8 32.6*   MCV 103.2* 103.7* 104.2*   WBC 6.99 7.83 8.30   Platelet Count 245 282 250         Anemia Diagnosis: Acute: Acute Anemia, Unspecified             History of Presenting Illness and Interval History/24 hour Events:   CC: bilateral leg weakness, urinary incontinence     HPI per Admitting Provider  "  [name] is a 62 y.o. female hx DVT 2012, prior ulcerative colitis, HTN who presents with bilateral leg weakness, urinary incontinence. This has been for 3 days. She has difficulty making to the bathroom so had urinary incontinence. She also notes low  back pain. She is supposed to be on Xarelto but has not taken it for 2 days as she ran out. These symptoms are sudden onset, moderate intensity, without alleviating factors.  "    7/25: Patient reports that she fell into the wall approximately week ago.  She ran out of Xarelto anticoagulation medications on Thursday or Friday of last week.  She has not taken Xarelto for at least 6 days.  Patient does have intermittent low back pain.    7/26: Patient reports that her back pain is currently controlled.  Patient does have weakness of her bilateral lower extremity.  Her urinary  incontinence has resolved.    7/27: Pt has no new complaints.  Her husband reports that she is non-compliant with medical follow up since the COVID pandemic.  Pt has had 15 lbs weight loss in 3-4 months.  She quit smoking 15 yrs ago when she was diagnosed with a DVT.    7/28: Pt has intermittent back pain.  Pt agrees that she has lost weight.       Physical Exam:     Vitals:    01/17/23 0915   BP: 129/87   Pulse: 84   Resp:    Temp:    SpO2:        Intake and Output Summary (Last 24 hours) at Date Time  No intake or output data in the 24 hours ending 01/17/23 1558      General: awake, alert, oriented x 3; no acute distress.  HEENT: Normocephalic, atraumatic. MMM  Neck: supple  Cardiovascular: regular rate and rhythm, no murmurs, rubs or gallops  Lungs: clear to auscultation bilaterally, without wheezing, rhonchi, or rales  Abdomen: soft, non-tender, non-distended; no palpable masses, no hepatosplenomegaly, normoactive bowel sounds, no rebound or guarding  Extremities: no clubbing, cyanosis, or edema  Neuro: cranial nerves grossly intact, strength 5/5 in upper and 3/5 hip flexion and knee extension and 4+/5 dorsiflexion and plantarflexion, sensation intact, knee DTRs 2/4, follows commands  Skin: no rashes or lesions noted      Medications:     Current Facility-Administered Medications   Medication Dose Route Frequency    lidocaine  1 patch Transdermal Q24H    methocarbamol  500 mg Oral QID    metoprolol tartrate  50 mg Oral Q12H SCH    multivitamin  1 tablet Oral Daily    pantoprazole  40 mg Oral Daily    timolol  1 drop Left Eye Daily    vitamin D (ergocalciferol)  50,000 Units Oral Weekly         Labs:     Results       ** No results found for the last 24 hours. **              Radiology:     Radiology Results (24 Hour)       Procedure Component Value Units Date/Time    CT Chest Abdomen Pelvis W Contrast [[id number]] Resulted: 01/17/23 1517    Order Status: Sent Updated: 01/17/23 1518            Lines  Patient  Lines/Drains/Airways Status       Active PICC Line / CVC Line / PIV Line / Drain / Airway / Intraosseous Line / Epidural Line / ART Line / Line / Wound / Pressure Ulcer / NG/OG Tube       Name Placement date Placement time Site Days    Peripheral IV 01/13/23 18 G Standard Left  Antecubital 01/13/23  1903  Antecubital  less than 1    Wound 05/25/19 Surgical Incision Eye Left eye pads, paper tape 05/25/19  1116  Eye  1329                           Signed by: Frederich Cha, DO, MD,  Cc    I have spent 50 minutes with the patient on counseling, chart review, and coordinating care

## 2023-01-17 NOTE — PT Eval Note (Signed)
Physical Therapy Evaluation  [name]  Post Acute Care Therapy Recommendations:     Discharge Recommendations:  Home with supervision, Home with home health PT  D/C Milestones: safe negotiation of stairs                Anticipate achievement in 2 sessions    DME needs IF patient is discharging home:  (JUNIOR rw)    Therapy discharge recommendations may change with patient status.  Please refer to most recent note for up-to-date recommendations.      Assessment:   Significant Findings: none    [name] is a 62 y.o. female admitted 01/13/2023.  Pt was received supine in  bed when this PT arrived to the room and was agreeable to participating in PT.  Pt presents with flat affect and her husband answered many of the PLOF and environmental questions.  Pt was  taking meds and was very slow with all movements.  Min for supine to sit. TLSO donned. Amb to bathroom and then to bedside chair.  Pt and husband report that she has never used a walker.  Pt pushing walker significantly too far ahead of her and leaning over at hips.  She was unable to correct this even with vc. Pt was made comfortable and left sitting in the bedside chair with all needs met.       Impairments:  decreased independence with functional mobility    Therapy Diagnosis: gt impairment    Rehabilitation Potential: good for goals    Treatment Activities: eval, bed mob and gt training    Educated the patient to role of physical therapy, plan of care, goals of therapy and safety with mobility and ADLs, spine precautions.    Informed patient  of the risks and benefits of participating in PT.    Plan:   Treatment/Interventions: TE, bed mob training, transfer training, gt training    PT Frequency: 3-4x/wk         Unit: [facility] TOWER 8  Bed: F842/F842.01     Precautions and Contraindications:   Brace when OOB; may be put on sitting EOB  Spinal precautions  Falls  May shower without brace    Activity Orders  As tolerated      Consult received for [name] for PT Evaluation and Treatment.  Patient's medical condition is appropriate for Physical therapy intervention at this time.    Medical Diagnosis: Spinal stenosis of lumbosacral region [M48.07]      History of Present Illness:   [name] is a 62 y.o. female admitted on 01/13/2023 hx DVT 2012, prior ulcerative colitis, HTN who presents with bilateral leg weakness, urinary incontinence. This has been for 3 days. She has difficulty making to the bathroom so had urinary incontinence. She also notes low back pain.    Past Medical/Surgical History:  Medical History[1]   Past Surgical History:   Procedure Laterality Date    BUNIONECTOMY      left foot    CATARACT EXTRACTION      EYE SURGERY Bilateral 2013    laser surgery for glaucoma, bilateral trabeculectomy    GLAUCOMA SURGERY      HYSTERECTOMY      removed both ovaries and tubes    revision of hysterectomyl suture line      REVISION, BLEB, SCLERAL PATCH GRAFT Left 05/25/2019    Procedure: REVISION, BLEB;  Surgeon: Lamount Cranker, MD;  Location: [facility];  Service: Ophthalmology;  Laterality: Left;    TONSILLECTOMY      TRABECULECTOMY Left 03/22/2014    Procedure: TRABECULECTOMY;  Surgeon: Pecola Leisure, MD;  Location: [facility];  Service: Ophthalmology;  Laterality: Left;  TRABECULECTOMY REVISION, LEFT EYE  Q1=N  ANES=MAC  EQUIP=MITOMYCIN C, 0.4mg /cc, DISPENSE 5cc (ORDER TO FOLLOW)  MD REQ 30 MIN  ALLERGIES=COSOPT, AZOPT, ALPHAGAN  **Need RESIGHT SET from SPD    ureterectomy left             X-Rays/Tests/Labs:  1.Compared to prior exam of 01/20/2021, there is new moderate compression  deformity of T12 vertebral body with associated bony fragmentation.  Although this is new from the prior exam of 2022, this is probably a  chronic fracture. Recommend correlation with history and examination.  2.Redemonstrated severe T11, moderate L1, L2, L3 compression deformities.  3.Disc bulge and ligamentum flavum  thickening resulting in severe spinal  canal stenosis at L3-L4 level.  4.3-mm anterolisthesis of L3 on L4.  5.Diffuse bony demineralization.  6.Partially visualized left pleural effusion.    1. Nonocclusive thrombus within the right popliteal vein.  2. Thrombus within the left common femoral, greater saphenous, femoral,  popliteal, and gastrocnemius veins as described.  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    Subjective:    Patient is agreeable to participation in the therapy session. Family and/or guardian are agreeable to patient's participation in the therapy session. Nursing clears patient for therapy.     Pt reports this brace is uncomfortable    Patient Goal: less discomfort with the brace    Pain:   Scale: denies pain  Location:   Intervention:       Objective:    Patient is in bed with  PIV access in place.    TLSO donned with pt sitting EOB    Cognitive Status and Neuro Exam:  Arousal/Alertness: Appropriate responses to stimuli  Attention Span: Appears intact  Memory: Appears intact  Following Commands: Follows all commands and directions without difficulty  Safety Awareness: independent  Insights: Fully aware of deficits  Problem Solving: Able to problem solve independently    Patient is alert and oriented x 3 and follows directions without difficulty.     Sensation: intact to light touch; denies  paresthesia     Postural Assessment:   Moderately kyphotic    Musculoskeletal Examination  RLE ROM: WFL   LLE ROM: WFL     RLE Strength: WFL   LLE Strength: WFL     UE Strength and ROM:  WFL      Functional Mobility  Rolling: min  Supine to Sit: min  Sit to Supine: Not tested - pt left in bedside chair  Scooting: supervision  Sit to Stand: cg with rw  Stand to Sit: cg  Transfers: cg with rw    PMP - Progressive Mobility Protocol   PMP Activity: Step 6 -  Walks in Room  Distance Walked (ft) (Step 6,7): 20 Feet (and 10')     Ambulation  Weightbearing Status: no restrictions   Device Used: rw  Level of assistance required: cg  Ambulation Distance: 70' and 10'  Pattern: kyphotic withB hip flexion and rw too far ahead  Stair Management: nt     Balance  Static Sitting: normal  Dynamic Sitting: normal  Static Standing: good with rw  Dynamic Standing: fair + with rw      Coordination/Motor Planning   WNL    Participation Effort: good    Endurance:  not challenged      Pt left sitting in bedside chair with needs met with RN informed of status as well as outcome of PT session.    Patient left with call bell and phone within reach, chair/bed alarm activated,, fall mat. All needs met and all questions answered.    Goals:   Goals  Goal Formulation: With patient/family  Time for Goal Acheivement: 3 visits  Goals: Select goal  Pt Will Go Supine To Sit: with supervision  Pt Will Perform Sit To Supine: with minimal assist  Pt Will Transfer Bed/Chair: with rolling walker;with supervision  Pt Will Ambulate: 51-100 feet;with rolling walker;with supervision  Pt Will Go Up / Down Stairs: 1 flight;with contact guard assist;With rail                                [employer], P.T.  Pager number [phone]    Time of Treatment  PT Received On: 01/17/23  Start Time: 1037  Stop Time: 1131  Time Calculation (min): 54 min                   [1]   Past Medical History:  Diagnosis Date    Chronic ulcerative colitis     nio issues for long time     Claustrophobia     Clotting disorder     DVT LLE 2012    Deep venous thrombosis of calf 2012    LLL/on xeralto//    GERD (gastroesophageal reflux disease)     Glaucoma NEC     bilat    Hypertensive disorder     controlled well with medication    Ovarian cyst, right     Plantar fasciitis     Seasonal allergies

## 2023-01-18 ENCOUNTER — Encounter: Payer: Self-pay | Admitting: Diagnostic Radiology

## 2023-01-18 LAB — BASIC METABOLIC PANEL
Anion Gap: 11 (ref 5.0–15.0)
BUN: 5 mg/dL — ABNORMAL LOW (ref 7–21)
CO2: 17 mEq/L (ref 17–29)
Calcium: 8.1 mg/dL — ABNORMAL LOW (ref 8.5–10.5)
Chloride: 109 mEq/L (ref 99–111)
Creatinine: 0.7 mg/dL (ref 0.4–1.0)
GFR: >60 mL/min/{1.73_m2} (ref >=60.0)
Glucose: 78 mg/dL (ref 70–100)
Hemolysis Index: 17 Index
Potassium: 4 mEq/L (ref 3.5–5.3)
Sodium: 137 mEq/L (ref 135–145)

## 2023-01-18 LAB — CBC
Absolute nRBC: 0 10*3/uL (ref <=0.00)
Hematocrit: 35.8 % (ref 34.7–43.7)
Hemoglobin: 11.8 g/dL (ref 11.4–14.8)
MCH: 34.5 pg — ABNORMAL HIGH (ref 25.1–33.5)
MCHC: 33 g/dL (ref 31.5–35.8)
MCV: 104.7 fL — ABNORMAL HIGH (ref 78.0–96.0)
MPV: 10.6 fL (ref 8.9–12.5)
Platelet Count: 267 10*3/uL (ref 142–346)
RBC: 3.42 10*6/uL — ABNORMAL LOW (ref 3.90–5.10)
RDW: 15 % (ref 11–15)
WBC: 6.11 10*3/uL (ref 3.10–9.50)
nRBC %: 0 /100 WBC (ref <=0.0)

## 2023-01-18 MED ORDER — MIRTAZAPINE 15 MG PO TABS
7.5000 mg | ORAL_TABLET | Freq: Every evening | ORAL | Status: DC
Start: 2023-01-18 — End: 2023-01-22
  Administered 2023-01-18 – 2023-01-21 (×4): 7.5 mg via ORAL
  Filled 2023-01-18 (×5): qty 1

## 2023-01-18 NOTE — Progress Notes (Signed)
Patient was seen by Catholic volunteer:    []  Leanord Asal.  []  Vania Rea.  []  Tuyet N.[]   Renee Pain. [x]   Kalman Drape.    Sr. Woody Seller  Spiritual Care Department  [phone]     Chaplain Service      Background:  Visit Type: Initial was made by Chaplain with patient, [name], based on Source: Chaplain Initiated.  Present at Visit: Patient, Family members.  Spiritual Care Provided to: patient.    .    Summary:  Spiritual Care Interventions: Administered Communion  Reason for Visit: Sacramental/ritual   Spiritual Care Outcomes: Patient expressed appreciation of visit, Family appears to have appreciated visit

## 2023-01-18 NOTE — Consults (Signed)
Nutritional Support Services  Nutrition Assessment    [name] 62 y.o. female   MRN: [id number]    NUTRITION RECOMMENDATIONS:    Continue Regular diet as ordered  Will add Ensure HP TID with meals    -----------------------------------------------------------------------------------------------------------------                                                      Assessment Data:     Referral Source: Consult   Reason for Referral: Malnutrition     Assessment:   62 y.o. female hx DVT 2012, prior ulcerative colitis, HTN who presents with bilateral leg weakness, urinary incontinence likely secondary to 6 mm fluid collection at the T12 vertebrae extending to the mid inferior T11 and mid L1 indenting the anterior thecal sac likely from a small hematoma. Pt/spouse reports loss of 15 lbs/12% body weight in the past 3-4 months.     Pt's spouse reports pt has had a poor appetite for several months while pt reported 1-2 weeks. Pt also minimized amount of weight lost recently. Pt had been drinking Ensure supplements at home; will order here. Spouse also reports MD plans to begin appetite stimulant tonight which is not yet ordered.    Diet/Social Hx:   Orders Placed This Encounter   Procedures    Adult diet Regular       ANTHROPOMETRIC  Height: 157.5 cm (5' 2.01")  Weight: 52 kg (114 lb 10.2 oz)    Nutrition Focused Physical Exam (NFPE):   Head: temple region: slight depression with decrease in muscle tone/resistance (moderate muscle loss - temporalis), orbital region: slightly dark circles, somewhat hollow look, some decrease in bounce back of fat pads (moderate fat loss), and buccal region: slight depression, somewhat sunken appearance, flat cheeks, decrease in bounce back of fat pads (moderate fat loss)  Upper Body: clavicle bone region: some protrusion of the clavicle with decrease in muscle tone/resistance (moderate muscle loss - pectoralis major), upper arm region: some fat in pinch between fingers, but not ample  (mild fat loss), and dorsal hand region: slight depression, decrease in muscle tone/resistance (mild muscle loss - interosseous)  Lower Body: posterior calf region: some shape to the bulb, but not well-developed, decrease in muscle tone/resistance (moderate muscle loss - gastrocnemius)  Edema: none noted    ESTIMATED NEEDS:  1400-1600 kcals (27-30 kcals/kg)  60-75 gm protein (1.2-1.4 gm/kg)  Fluids per Team or 1 ml/kcal    Pertinent Medications:   MVI, Vitamin D    Pertinent labs:  Recent Labs   Lab 01/18/23  0438 01/14/23  0330 01/13/23  2111   Sodium 137  More results in Results Review 140   Potassium 4.0  More results in Results Review 4.4   Chloride 109  More results in Results Review 106   CO2 17  More results in Results Review 25   BUN 5*  More results in Results Review 11   Creatinine 0.7  More results in Results Review 0.7   Calcium 8.1*  More results in Results Review 8.8   Albumin  --   --  2.2*   Protein, Total  --   --  6.0   Bilirubin, Total  --   --  0.8   Alkaline Phosphatase  --   --  214*   ALT  --   --  30   AST (SGOT)  --   --  41   Glucose 78  More results in Results Review 80   More results in Results Review = values in this interval not displayed.                                                                 Nutrition Diagnosis      Severe malnutrition related to inadequate protein energy intake in the setting of acute illness as evidenced by  intake < 50% of estimated energy requirements for > 5 days, >7.5% weight loss x 3 months, moderate muscle depletion (temporalis, pectoralis major, gastrocnemius).                                                             Monitoring     Monitor weight, labs, GI status and POC.      Sheli Morrigan Wickens,RDN  Clinical Dietitian   RD Office [phone] or available on Secure Chat

## 2023-01-18 NOTE — Malnutrition Assessment (Signed)
[name] is a 62 y.o. female patient.   [id number]    Malnutrition Assessment   Malnutrition Documentation    Severe malnutrition related to inadequate protein energy intake in the setting of acute illness as evidenced by  intake < 50% of estimated energy requirements for > 5 days, >7.5% weight loss x 3 months, moderate muscle depletion (temporalis, pectoralis major, gastrocnemius).       Mathis Dad Daneli Butkiewicz, RDN      If physician disagrees with this assessment see addendum.

## 2023-01-18 NOTE — Plan of Care (Signed)
Procedure(s):  Temporary IVC FILTER PLACEMENT    2 Days Post-Op  -------------------     Patient Lines/Drains/Airways Status       Active Lines, Drains and Airways       Name Placement date Placement time Site Days    Peripheral IV 01/17/23 20 G Anterior;Left Forearm 01/17/23  1431  Forearm  less than 1    Venous Sheath 8 Fr. Right Femoral 01/16/23  1357  Femoral  1                  Pt A&O X4. VSS.   Tolerated whole pills with thin liquids.  Pt able to ambulate to the bathroom as well as reposition with assistance.  Encouraged TCDB and IS use.   Continent of bowel and bladder. LBM 01/17/23. Pt voiding via bathroom.  Pain Controlled with tylenol.  Good pedal pulses bilaterally,cap refill <3 sec in all 4 extremities.  All dressings are clean, dry, and intact and all sites appear free from visual signs of infection.    Call bell and bedside table confirmed within reach during RN rounding. Patient expressed no further needs or concerns at this time.      Notable Shift Events  >>>  Most Recent Set of Vitals   Visit Vitals  BP 131/87   Pulse 92   Temp 97.6 F (36.4 C) (Oral)   Resp 17   Ht 1.575 m (5' 2.01")   Wt 52 kg (114 lb 10.2 oz)   SpO2 100%   BMI 20.96 kg/m           [facility], RN  01/18/23  10:13 AM   Problem: Moderate/High Fall Risk Score >5  Goal: Patient will remain free of falls  Outcome: Progressing     Problem: Pain interferes with ability to perform ADL  Goal: Pain at adequate level as identified by patient  Outcome: Progressing     Problem: Side Effects from Pain Analgesia  Goal: Patient will experience minimal side effects of analgesic therapy  Outcome: Progressing     Problem: Impaired Mobility  Goal: Mobility/Activity is maintained at optimal level for patient  Outcome: Progressing     Problem: Peripheral Neurovascular Impairment  Goal: Extremity color, movement, sensation are maintained or improved  Outcome: Progressing     Problem: Compromised skin integrity  Goal: Skin integrity is  maintained or improved  Outcome: Progressing     Problem: Nutrition  Goal: Nutritional intake is adequate  Outcome: Progressing     Problem: Neurological Deficit  Goal: Neurological status is stable or improving  Outcome: Progressing     Problem: Potential for Aspiration  Goal: Risk of aspiration will be minimized  Outcome: Progressing     Problem: Communication Impairment  Goal: Will be able to express needs and understand communication  Outcome: Progressing     Problem: Compromised Sensory Perception  Goal: Sensory Perception Interventions  Outcome: Progressing     Problem: Compromised Moisture  Goal: Moisture level Interventions  Outcome: Progressing     Problem: Compromised Activity/Mobility  Goal: Activity/Mobility Interventions  Outcome: Progressing     Problem: Compromised Nutrition  Goal: Nutrition Interventions  Outcome: Progressing     Problem: Compromised Friction/Shear  Goal: Friction and Shear Interventions  Outcome: Progressing

## 2023-01-18 NOTE — Progress Notes (Signed)
01/18/23 1054   CM Review   CM Comments 7/29: Spinal stenosis of lumbosacral region. DISPO: Home with HHC services     MRI done  DVT on Left lower and different vein through out body  IVC filter placed   Avasys placed.   Brace placed yesterday.  Not medically stable for d/c today  CM will continue to follow.Fonda Kinder, MSW  SW Case Manager  Case Manager Department  [phone]

## 2023-01-18 NOTE — Progress Notes (Signed)
[facility] HOSPITALIST PROGRESS NOTE    Date Time: 01/18/23 9:15 AM  Patient Name: [name]  Attending Physician: Frederich Cha, DO      Assessment   62 y.o. female hx DVT 2012, prior ulcerative colitis, HTN who presents with bilateral leg weakness, urinary incontinence likely secondary to 6 mm fluid collection at the T12 vertebrae extending to the mid inferior T11 and mid L1 indenting the anterior thecal sac likely from a small hematoma.      Plan   #Back pain from an acute T10 vertebral fracture with 2 mm retropulsion  #Bilateral leg weakness, urinary incontinence likely from focal fluid collection measuring 6 mm extending from the inferior T11 to mid L1 level causing indentation of the anterior thecal sac displacing the thecal sac likely from an epidural small hematoma  #Subacute T11 vertebral fracture, and subacute T12 vertebral fracture  #Old L1-L3 compression fractures  -Transfer patient to the neuro [facility]  -Neurochecks every 2 hours  -Fall, aspiration, delirium precautions  -Analgesia CK normal.   -CT lumbar spine suggested L3-L4 canal stenosis. It also mentioned T12 fracture new since 01/20/21 but likely chronic.   -MRI lumbar spine w/wo contrast reviewed.  -Neurosurgery was consulted for vertebral fractures and possible epidural small hematoma at the T12 level.  -No NSAIDs, anticoagulation, or antiplatelet therapy.  -TLSO back brace when OOB  -Upright x-rays of the thoracic spine reviewed.  Limited visualization.  Multilevel compression fractures.  Bones are relatively demineralized in appearance.  -Has poor dentition therefore I want dental clearance prior to starting on bone health therapy (bisphosphonates or Prolia).  -Patient's last dose of Xarelto was on 01/07/2023 or 01/08/2023.  -Appreciate neurosurgery input  -OT/PT recommends acute rehabilitation    #Bilateral acute and subacute pulmonary emboli  #Acute left LE DVT  #Hx DVT 2012, prior ulcerative colitis, HTN - Noted.   -Patient was cleared to restart  Xarelto 10 mg p.o. nightly on 01/19/2023 by neurosurgery.  -Hematology recommends bridging with heparin gtt to lovenox or eliquis.    -Consider Eliquis 5mg  twice daily instead of Xarelto   -Sent a secure chat to [facility] hematology today and follow-up tomorrow prior to initiation of heparin drip  -Venous Doppler ultrasound of bilateral lower extremities  1. Nonocclusive thrombus within the right popliteal vein.  2. Thrombus within the left common femoral, greater saphenous, femoral,  popliteal, and gastrocnemius veins as described.  -Plan for temporary IVC filter   -Hematology input appreciated    #Unintentional weight loss   #former smoker  -CT CAP reviewed  1.No evidence of metastatic disease in the chest, abdomen or pelvis.  2.Bilateral acute/subacute pulmonary emboli.  3.Redemonstration of deep venous thrombosis in the left superficial and  left common femoral veins. Mixing artifact versus thrombus in the right  common femoral and right external iliac veins.  4.Trace bilateral pleural effusions, left greater than right.  5.Thoracolumbar vertebral body fractures better assessed on the prior  lumbar spine MRI.  -Mirtazapine 7.5 mg p.o. nightly    DVT prophy: SCDs. Lovenox.   Diet: regular  Code status: full  Status: inpatient                  Recent Labs   Lab 01/18/23  0438 01/16/23  0104 01/15/23  0328   Hemoglobin 11.8 11.1* 12.4   Hematocrit 35.8 32.4* 36.8   MCV 104.7* 103.2* 103.7*   WBC 6.11 6.99 7.83   Platelet Count 267 245 282  Anemia Diagnosis: Acute: Acute Anemia, Unspecified             History of Presenting Illness and Interval History/24 hour Events:   CC: bilateral leg weakness, urinary incontinence     HPI per Admitting Provider  "  [name] is a 62 y.o. female hx DVT 2012, prior ulcerative colitis, HTN who presents with bilateral leg weakness, urinary incontinence. This has been for 3 days. She has difficulty making to the bathroom so had urinary incontinence. She also notes low back  pain. She is supposed to be on Xarelto but has not taken it for 2 days as she ran out. These symptoms are sudden onset, moderate intensity, without alleviating factors.  "    7/25: Patient reports that she fell into the wall approximately week ago.  She ran out of Xarelto anticoagulation medications on Thursday or Friday of last week.  She has not taken Xarelto for at least 6 days.  Patient does have intermittent low back pain.    7/26: Patient reports that her back pain is currently controlled.  Patient does have weakness of her bilateral lower extremity.  Her urinary incontinence has resolved.    7/27: Pt has no new complaints.  Her husband reports that she is non-compliant with medical follow up since the COVID pandemic.  Pt has had 15 lbs weight loss in 3-4 months.  She quit smoking 15 yrs ago when she was diagnosed with a DVT.    7/28: Pt has intermittent back pain.  Pt agrees that she has lost weight.       7/29: Patient still has a poor appetite.  She does not have insomnia.  Patient does have intermittent low back pain.  She denies any chest pain, shortness of breath, nausea, vomiting, or bowel changes.  Physical Exam:     Vitals:    01/18/23 0755   BP: 131/87   Pulse: 92   Resp: 17   Temp: 97.6 F (36.4 C)   SpO2: 100%       Intake and Output Summary (Last 24 hours) at Date Time  No intake or output data in the 24 hours ending 01/18/23 0915      General: awake, alert, oriented x 3; no acute distress.  HEENT: Normocephalic, atraumatic. MMM  Neck: supple  Cardiovascular: regular rate and rhythm, no murmurs, rubs or gallops  Lungs: clear to auscultation bilaterally, without wheezing, rhonchi, or rales  Abdomen: soft, non-tender, non-distended; no palpable masses, no hepatosplenomegaly, normoactive bowel sounds, no rebound or guarding  Extremities: no clubbing, cyanosis, or edema  Neuro: cranial nerves grossly intact, strength 5/5 in upper and 3/5 hip flexion and knee extension and 4+/5 dorsiflexion and  plantarflexion, sensation intact, knee DTRs 2/4, follows commands  Skin: no rashes or lesions noted      Medications:     Current Facility-Administered Medications   Medication Dose Route Frequency    lidocaine  1 patch Transdermal Q24H    methocarbamol  500 mg Oral QID    metoprolol tartrate  50 mg Oral Q12H SCH    multivitamin  1 tablet Oral Daily    pantoprazole  40 mg Oral Daily    timolol  1 drop Left Eye Daily    vitamin D (ergocalciferol)  50,000 Units Oral Weekly         Labs:     Results       Procedure Component Value Units Date/Time    CBC without Differential [[id number]]  (  Abnormal) Collected: 01/18/23 0438    Specimen: Blood, Venous Updated: 01/18/23 0625     WBC 6.11 x10 3/uL      Hemoglobin 11.8 g/dL      Hematocrit 16.1 %      Platelet Count 267 x10 3/uL      MPV 10.6 fL      RBC 3.42 x10 6/uL      MCV 104.7 fL      MCH 34.5 pg      MCHC 33.0 g/dL      RDW 15 %      nRBC % 0.0 /100 WBC      Absolute nRBC 0.00 x10 3/uL     Basic Metabolic Panel [[id number]]  (Abnormal) Collected: 01/18/23 0438    Specimen: Blood, Venous Updated: 01/18/23 0607     Glucose 78 mg/dL      BUN 5 mg/dL      Creatinine 0.7 mg/dL      Calcium 8.1 mg/dL      Sodium 811 mEq/L      Potassium 4.0 mEq/L      Chloride 109 mEq/L      CO2 17 mEq/L      Anion Gap 11.0     GFR >60.0 mL/min/1.73 m2      Hemolysis Index 17 Index               Radiology:     Radiology Results (24 Hour)       Procedure Component Value Units Date/Time    IVC Filter Placement [[id number]] Collected: 01/17/23 1643    Order Status: Completed Updated: 01/18/23 0854    Narrative:      PROCEDURE: IVC filter placement    PROCEDURE SUMMARY:  *Right common femoral vein access under ultrasound guidance.  *Pre and post IVC venography.   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    OPERATOR(S):  Attending VIR Physician(s): Darci Current, MD    CONTRAST:  Contrast agent: None  Contrast volume (mL): 0    RADIATION  PARAMETERS:  Fluoroscopy time (minutes): 1.8  Reference air kerma (mGy): 17     TECHNIQUE/FINDINGS: Informed consent for the procedure including risks,  benefits and alternatives was obtained. A safety timeout was performed and  documented with all members of the procedure team present, including  verification of patient identification, correct procedure, procedure site,  and laterality. The patient was positioned supine.     Preparation: The site was prepared and draped using all elements of maximal  sterile barrier technique including sterile gloves, sterile gown, cap,  mask, large sterile sheet, sterile ultrasound probe cover, sterile  ultrasound gel, hand hygiene and cutaneous antisepsis with 2%  chlorhexidine.  Medical reason for site preparation exception: Not applicable.    Sedation: The patient was placed under moderate (conscious) sedation which  was administered/monitored by the performing provider.    SEDATION START TIME: 1335  SEDATION END TIME: 1402    Sonographic interrogation of the right common femoral vein prior to  puncture.  A sonographic image was obtained for documentation.  Using  standard sterile technique, puncture of the vein was performed under direct  sonographic guidance with a 21 gauge single wall needle. A 0.018 inch  guidewire was advanced into the inferior vena cava. The needle was removed  and a 5 French coaxial dilator system was placed. A 0.035 inch Amplatz wire  was advanced under fluoroscopic guidance into the inferior vena  cava. The  filter deployment sheath was advanced over the wire to the infrarenal  inferior vena cava and the wire and stiffener were removed.  Digital  subtraction inferior venacavography was performed and demonstrated The  inferior vena cava is normal in caliber. There is non variant venous  anatomy. No filling defects are evident. There is no extrinsic  compression..    The wire was readvanced through the deployment sheath.  A Bard Denali  optional inferior  vena cava filter was deployed with the guidance of  anatomic landmarks from the venacavogram. The apex of the filter projects  at L1-2 level. Inferior venacavography performed through the sheath  demonstrates appropriate position of the filter without significant tilt,  with its apex below the renal veins. The sheath was removed and hemostasis  was achieved manually. The procedure was well tolerated.      All indicated images were saved to PACS.      Impression:        Temporary IVC filter placement, bard Denali.    Darci Current, MD  01/17/2023 4:46 PM    CT Chest Abdomen Pelvis W Contrast [[id number]] Collected: 01/17/23 1902    Order Status: Completed Updated: 01/17/23 1934    Narrative:      HISTORY:  Weight-loss and recurrent DVT, evaluate for malignancy    COMPARISON: Lumbar spine MRI from 01/14/2023    TECHNIQUE: CT of the chest, abdomen, and pelvis performed with intravenous  contrast. The following dose reduction techniques were utilized: automated  exposure control and/or adjustment of the mA and/or KV according to patient  size, and the use of an iterative reconstruction technique.    CONTRAST: iohexol (OMNIPAQUE) 350 MG/ML injection 100 mL    FINDINGS:    Chest:  There is bilateral lower lobe subsegmental atelectasis. There is no  consolidation. There are trace bilateral pleural effusions, left greater  than right. The central airways are patent. The thyroid gland is normal.  There is no mediastinal, hilar or axillary adenopathy.     There is an acute/subacute pulmonary embolus in the interlobar pulmonary  artery extending into the right lower lobe basal trunk. There are  acute/subacute pulmonary emboli in the right lower lobe posterior segmental  and subsegmental pulmonary arteries. There is acute/subacute pulmonary  emboli in the left lower lobar and left lower lobe segmental pulmonary  arteries. The main pulmonary artery is normal in caliber.     The heart size is normal. There is no pericardial effusion.  The thoracic  aorta is normal in caliber without dissection. There is mild atheromatous  disease of the aorta and its branches.    Abdomen/Pelvis:  There are no focal liver lesions. There is cholelithiasis. There is no  biliary ductal dilatation. The pancreas is normal. The spleen is not  enlarged. The adrenal glands are normal. The kidneys enhance symmetrically  without hydronephrosis. There is trace right perinephric fluid. There is  infrarenal IVC filter. The bladder is normal. The patient is status post  hysterectomy. There are no adnexal masses. There is no bowel obstruction.  There is mild colonic diverticulosis. There is no abdominopelvic  adenopathy. There is no ascites. There is mild atheromatous disease of the  abdominal aorta and its branches. There is deep venous thrombosis in the  visualized left superficial femoral vein and left common femoral vein.  There is mixing artifact versus thrombus in the right common femoral and  right external iliac veins. There is a 2.1 x 1.3 cm lipoma in  the  subcutaneous right lateral hemiabdomen. There is a small fat-containing  upper abdominal ventral hernia. There is a chronic fracture deformity of  the left pubic symphysis. There is transitional lumbosacral anatomy with a  left sacralized L5 vertebral body. Redemonstration of multiple  thoracolumbar vertebral body fractures including severe T11 and T12  fractures.       Impression:        1.No evidence of metastatic disease in the chest, abdomen or pelvis.  2.Bilateral acute/subacute pulmonary emboli.  3.Redemonstration of deep venous thrombosis in the left superficial and  left common femoral veins. Mixing artifact versus thrombus in the right  common femoral and right external iliac veins.  4.Trace bilateral pleural effusions, left greater than right.  5.Thoracolumbar vertebral body fractures better assessed on the prior  lumbar spine MRI.    Carolyn Stare, MD  01/17/2023 7:32 PM            Lines  Patient  Lines/Drains/Airways Status       Active PICC Line / CVC Line / PIV Line / Drain / Airway / Intraosseous Line / Epidural Line / ART Line / Line / Wound / Pressure Ulcer / NG/OG Tube       Name Placement date Placement time Site Days    Peripheral IV 01/13/23 18 G Standard Left Antecubital 01/13/23  1903  Antecubital  less than 1    Wound 05/25/19 Surgical Incision Eye Left eye pads, paper tape 05/25/19  1116  Eye  1329                           Signed by: Frederich Cha, DO, MD,  Cc    I have spent 50 minutes with the patient on counseling, chart review, and coordinating care

## 2023-01-18 NOTE — PT Progress Note (Signed)
[facility]   Physical Therapy Cancellation Note      Patient:  [name] MRN#:  [id number]  Unit:  [facility] TOWER 8 Room/Bed:  V409/W119.14    01/18/2023  Time: 10:24 AM       PT Cancellation: Visit  PT Visit Cancellation Reason: Patient/caregiver declines therapy at this time (wants to have a BM before PT)         Luther Bradley, PT  Pager: [phone]

## 2023-01-18 NOTE — PT Progress Note (Signed)
[facility]   Physical Therapy Cancellation Note      Patient:  [name] MRN#:  [id number]  Unit:  [facility] TOWER 8 Room/Bed:  V409/W119.14    01/18/2023  Time: 0910      PT Cancellation: Visit  PT Visit Cancellation Reason: Patient/caregiver declines therapy at this time (wants to eat breakfast)         Luther Bradley, PT  Pager: [phone]

## 2023-01-18 NOTE — OT Progress Note (Addendum)
Occupational Therapy Treatment  [name]        Post Acute Care Therapy Recommendations:     Discharge Recommendations:  Acute Rehab    If Acute Rehab  recommended discharge disposition is not available, patient will need hands on assist for functional mobility and ADLs and HHOT, home health aide, 1st floor set-up, skilled transport in/out of the home, and family training.     DME needs IF patient is discharging home: Front wheel walker, BSC, Long-handled sponge, Shower chair    Therapy discharge recommendations may change with patient status.  Please refer to most recent note for up-to-date recommendations.    Patient anticipated to benefit from and to be able to engage in 3 hours of therapy a day for 5 days a week.      Assessment:   Significant Findings: None    RN approved pt for therapy and pt semi-supine in bed upon OT arrival. Pt unable to recall spinal precautions. Provided re-education on spinal precautions, log roll technique, and simple task modification to promote increased pt safety and independence with ADLs. Pt completed bed mobility, UB/LB dressing, and short distance mobility as detailed below under functional measures. Provided initial education on use of a reacher and sock aide for doffing/donning LB clothing- pt able to demonstrate use of both for doffing/donning right LE sock with mod cues. Pt denied wanting to attempt underwear/pants at this time. Pt completed a simulated household transfer to a standard toilet with Mod A for lifting. Provided education on use of 3-in-1 commode over the toilet, next to the bed, or in the shower to promote overall safety and independence with multi-surface access upon d/c. Pt's husband present during the session and verbalized understanding; however, verbalizing concerns regarding pt's functional level for home, as he is unable to be with patient all the time. Pt's husband reporting they own a reacher, sock aide, and dressing stick. Pt is functioning  below PLOF and will benefit from continued acute OT services to maximize independence and safety with ADLs and functional transfers/mobility.      Treatment Activities: ADL retraining, functional t/f training, dynamic balance, pt education, compensatory education, DME education     Educated the patient to role of occupational therapy, plan of care, goals of therapy and safety with mobility and ADLs, energy conservation techniques, spine precautions, home safety.    Plan:    OT Frequency Recommended: 3-4x/wk     Continue plan of care.    Unit: [facility] TOWER 8  Bed: F842/F842.01       Precautions and Contraindications:   Falls  Spinal precautions  TLSO OOB- pt may doff/donn EOB   Pt may doff TLSO for showering     Updated Medical Status/Imaging/Labs:  Reviewed     Subjective: "I just need to rest a little bit."    Patient's medical condition is appropriate for Occupational Therapy intervention at this time.  Patient is agreeable to participation in the therapy session. Nursing clears patient for therapy.    Pain:   Scale: 7/10  Location: Buttocks  Intervention: Repositioned, rest, education on spinal precaution adherence     Objective:   Patient is in bed with peripheral IV in place.  Pt wore mask during therapy session:No      Cognition  Pt alert and oriented x 4. Pt able to follow simple commands with min increased time for processing and intermittent repetition. Note decreased STM and poor problem solving.  Functional Mobility  Rolling:  SPV  Supine to Sit: SBA- mod cues for log roll technique  Sit to Stand: CGA  Transfers: CGA with RW for short household distances; mod cues for RW management    PMP Activity: Step 6 - Walks in Room    Balance  Static Sitting: SPV  Dynamic Sitting: SBA  Static Standing: CGA with RW    Self Care and Home Management  Eating: Independent  Grooming: CGA standing at the sink  Bathing: Mod A - seated, simulated   UE Dressing: Mod A - intermittent assist for TLSO  (need assist for proper positioning and tightening of straps)  LE Dressing: Mod A- pt utilizing reacher and sock aide to doff/donn right LE sock; intermittent assist for positioning sock on sock aide; mod cues for technique; educated on reacher technique for pants, but pt denying wanting to attempt  Toileting: CGA utilizing toilet    Therapeutic Exercises  Incorporated throughout functional tasks    Participation: Good  Endurance: Fair+    Patient left with call bell within reach, all needs met, SCDs off as found, fall mat in place, bed alarm on, chair alarm N/A and all questions answered. RN notified of session outcome and patient response.     Goals:  Time For Goal Achievement: 10 visits  ADL Goals  Patient will groom self: Supervision, at sinkside  Patient will dress lower body: Stand by Assist, with AE  Patient will toilet: Stand by Assist  Mobility and Transfer Goals  Pt will perform functional transfers: Stand by Assist, with rolling walker  Neuro Re-Ed Goals  Pt will perform dynamic standing balance: for 10 minutes, Stand by Assist  Executive Fucntion Goals  Pt will follow spine precautions: Recall 3/3, to increase ability to complete ADLs, with supervision    PPE worn during session: procedural mask and gloves    Tech present: no  PPE worn by tech: N/A    Raliegh Ip, OTR/L   Pager #: [phone]     Time of Treatment  OT Received On: 01/18/23  Start Time: 1305  Stop Time: 1340  Time Calculation (min): 35 min    Treatment # 1 of 10 visits

## 2023-01-19 DIAGNOSIS — I824Y3 Acute embolism and thrombosis of unspecified deep veins of proximal lower extremity, bilateral: Secondary | ICD-10-CM

## 2023-01-19 DIAGNOSIS — I2699 Other pulmonary embolism without acute cor pulmonale: Secondary | ICD-10-CM

## 2023-01-19 LAB — ANTI-XA,UFH
Anti-Xa, UFH: <0.04 IU/mL
Anti-Xa, UFH: 0.4 IU/mL

## 2023-01-19 LAB — APTT: PTT: 30 s (ref 27–39)

## 2023-01-19 MED ORDER — HEPARIN (PORCINE) IN D5W 50-5 UNIT/ML-% IV SOLN (UNITS/KG/HR ONLY)
12.0000 [IU]/kg/h | INTRAVENOUS | Status: DC
Start: 2023-01-19 — End: 2023-01-20
  Administered 2023-01-19: 12 [IU]/kg/h via INTRAVENOUS
  Filled 2023-01-19: qty 500

## 2023-01-19 MED ORDER — HEPARIN SODIUM (PORCINE) 5000 UNIT/ML IJ SOLN
50.0000 [IU]/kg | INTRAMUSCULAR | Status: DC | PRN
Start: 2023-01-19 — End: 2023-01-20

## 2023-01-19 NOTE — Plan of Care (Signed)
Procedure(s):  Temporary IVC FILTER PLACEMENT    3 Days Post-Op  -------------------     Patient Lines/Drains/Airways Status       Active Lines, Drains and Airways       Name Placement date Placement time Site Days    Peripheral IV 01/17/23 20 G Anterior;Left Forearm 01/17/23  1431  Forearm  2    Venous Sheath 8 Fr. Right Femoral 01/16/23  1357  Femoral  3                  Pt A&O X4. VSS.  Tolerated whole pills with thin liquids.  Pt able to ambulate to toilet c RW as well as reposition self in bed without assistance.   Encouraged TCDB and IS use.   Continent of bowel and bladder. LBM 7/28. Pt voiding via toilet  Pain Controlled with oxycodone  Good pedal pulses bilaterally,cap refill <3 sec in all 4 extremities.  All dressings are clean, dry, and intact and all sites appear free from visual signs of infection.    Call bell and bedside table within reach at all times. Will continue to monitor patient  closely.     Notable Shift Events  >>>  Most Recent Set of Vitals   Visit Vitals  BP 114/75   Pulse 89   Temp 98.4 F (36.9 C) (Oral)   Resp 16   Ht 1.575 m (5' 2.01")   Wt 52 kg (114 lb 10.2 oz)   SpO2 98%   BMI 20.96 kg/m           Ladell Pier, RN  01/19/23  6:39 PM                                Problem: Moderate/High Fall Risk Score >5  Goal: Patient will remain free of falls  Outcome: Progressing     Problem: Pain interferes with ability to perform ADL  Goal: Pain at adequate level as identified by patient  Outcome: Progressing     Problem: Side Effects from Pain Analgesia  Goal: Patient will experience minimal side effects of analgesic therapy  Outcome: Progressing     Problem: Impaired Mobility  Goal: Mobility/Activity is maintained at optimal level for patient  Outcome: Progressing     Problem: Peripheral Neurovascular Impairment  Goal: Extremity color, movement, sensation are maintained or improved  Outcome: Progressing     Problem: Compromised skin integrity  Goal: Skin integrity is maintained or  improved  Outcome: Progressing     Problem: Nutrition  Goal: Nutritional intake is adequate  Outcome: Progressing     Problem: Neurological Deficit  Goal: Neurological status is stable or improving  Outcome: Progressing     Problem: Potential for Aspiration  Goal: Risk of aspiration will be minimized  Outcome: Progressing     Problem: Communication Impairment  Goal: Will be able to express needs and understand communication  Outcome: Progressing     Problem: Compromised Sensory Perception  Goal: Sensory Perception Interventions  Outcome: Progressing     Problem: Compromised Moisture  Goal: Moisture level Interventions  Outcome: Progressing     Problem: Compromised Activity/Mobility  Goal: Activity/Mobility Interventions  Outcome: Progressing     Problem: Compromised Nutrition  Goal: Nutrition Interventions  Outcome: Progressing     Problem: Compromised Friction/Shear  Goal: Friction and Shear Interventions  Outcome: Progressing     Problem: Safety  Goal: Patient will be free from injury  during hospitalization  Outcome: Progressing  Goal: Patient will be free from infection during hospitalization  Outcome: Progressing     Problem: Pain  Goal: Pain at adequate level as identified by patient  Outcome: Progressing     Problem: Discharge Barriers  Goal: Patient will be discharged home or other facility with appropriate resources  Outcome: Progressing     Problem: Psychosocial and Spiritual Needs  Goal: Demonstrates ability to cope with hospitalization/illness  Outcome: Progressing

## 2023-01-19 NOTE — OT Progress Note (Signed)
Occupational Therapy Treatment  [place] Lizabeth Leyden        Post Acute Care Therapy Recommendations:     Discharge Recommendations:  SNF    If SNF  recommended discharge disposition is not available, patient will need hands on assist for functional mobility and ADLs and HHOT, home health aide, 1st floor set-up, skilled transport in/out of the home, and family training.     DME needs IF patient is discharging home: Front wheel walker, BSC, Long-handled sponge, Shower chair    Therapy discharge recommendations may change with patient status.  Please refer to most recent note for up-to-date recommendations.    Assessment:   Significant Findings: None    RN approved pt for therapy and pt semi-supine in bed upon OT arrival. Pt able to recall 2/3 spinal precautions- provided external aide for pt to utilize for improved recall. Pt completed bed mobility via log roll, UB dressing, short distance mobility with the RW, and toileting as detailed below under functional measures. Pt required min structure and min-mod cues for self-monitoring, problem solving, and RW management throughout the session. Encouraged pt to practice AE for LB dressing and complete further therapeutic activities; however, pt not agreeable at this time. Due to poor activity tolerance, d/c recommendation updated to SNF versus AR. Team members made aware. Pt is functioning below PLOF and will benefit from continued acute OT services to maximize independence and safety with ADLs and functional transfers/mobility.      Treatment Activities: ADL retraining, functional t/f training, dynamic balance, pt education, compensatory education, DME education     Educated the patient to role of occupational therapy, plan of care, goals of therapy and safety with mobility and ADLs, energy conservation techniques, spine precautions, home safety.    Plan:    OT Frequency Recommended: 3-4x/wk     Continue plan of care.    Unit: [facility] TOWER 8  Bed:  F842/F842.01       Precautions and Contraindications:   Falls  Spinal precautions  TLSO OOB- pt may doff/donn EOB   Pt may doff TLSO for showering     Updated Medical Status/Imaging/Labs:  Reviewed     Subjective: "I will do whatever you want me to do."   Patient's medical condition is appropriate for Occupational Therapy intervention at this time.  Patient is agreeable to participation in the therapy session. Nursing clears patient for therapy.    *New OT orders received and acknowledged due to PE.     Pain:   Scale: 6/10  Location: Low back  Intervention: Repositioned, rest, education on spinal precaution adherence    Objective:   Patient is in bed with peripheral IV in place.  Pt wore mask during therapy session:No      Cognition  Pt alert and oriented x 4. Pt able to follow simple commands with min increased time for processing and intermittent repetition. Note decreased STM and poor problem solving.     Functional Mobility  Rolling:  SPV  Supine to Sit: SBA- mod cues for log roll technique  Sit to Stand: CGA  Transfers: CGA with RW for short household distances; mod cues for RW management    PMP Activity: Step 6 - Walks in Room    Balance  Static Sitting: SPV  Dynamic Sitting: SBA  Static Standing: CGA with RW    Self Care and Home Management  Eating: Independent  Grooming: CGA standing at the sink  Bathing: Mod A - seated, simulated  UE Dressing: Mod A - assist for TLSO (need assist for proper positioning and strap management; pt able to assist with strap management)  LE Dressing: Mod A- utilizing AE for footwear; pt denying practice today  Toileting: CGA utilizing toilet    Therapeutic Exercises  Incorporated throughout functional tasks    Participation: Good-  Endurance: Fair+    Patient left with call bell within reach, all needs met, SCDs off as found, fall mat in place, bed alarm N/A, chair alarm on and responder 5 connected and all questions answered. RN notified of session outcome and patient response.      Goals:  Time For Goal Achievement: 10 visits  ADL Goals  Patient will groom self: Supervision, at sinkside  Patient will dress lower body: Stand by Assist, with AE  Patient will toilet: Stand by Assist  Mobility and Transfer Goals  Pt will perform functional transfers: Stand by Assist, with rolling walker  Neuro Re-Ed Goals  Pt will perform dynamic standing balance: for 10 minutes, Stand by Assist  Executive Fucntion Goals  Pt will follow spine precautions: Recall 3/3, to increase ability to complete ADLs, with supervision    PPE worn during session: procedural mask and gloves    Tech present: no  PPE worn by tech: N/A    Raliegh Ip, OTR/L   Pager #: [phone]     Time of Treatment  OT Received On: 01/19/23  Start Time: 1545  Stop Time: 1610  Time Calculation (min): 25 min    Treatment # 2 of 10 visits

## 2023-01-19 NOTE — Progress Notes (Addendum)
[facility] HOSPITALIST PROGRESS NOTE    Date Time: 01/19/23 8:58 AM  Patient Name: [name]  Attending Physician: Gertie Baron, MD      Assessment   62 y.o. female hx DVT 2012, prior ulcerative colitis, HTN who presents with bilateral leg weakness, urinary incontinence likely secondary to 6 mm fluid collection at the T12 vertebrae extending to the mid inferior T11 and mid L1 indenting the anterior thecal sac likely from a small hematoma.      Plan   #Back pain from an acute T10 vertebral fracture with 2 mm retropulsion  #Bilateral leg weakness, urinary incontinence likely from focal fluid collection measuring 6 mm extending from the inferior T11 to mid L1 level causing indentation of the anterior thecal sac displacing the thecal sac likely from an epidural small hematoma  #Subacute T11 vertebral fracture, and subacute T12 vertebral fracture  #Old L1-L3 compression fractures  -[facility]  -Neurochecks every 2 hours  -Fall, aspiration, delirium precautions  -Analgesia CK normal.   -CT lumbar spine suggested L3-L4 canal stenosis. It also mentioned T12 fracture new since 01/20/21 but likely chronic.   -MRI lumbar spine w/wo contrast reviewed.  -Neurosurgery was consulted for vertebral fractures and possible epidural small hematoma at the T12 level.  -No NSAIDs, anticoagulation, or antiplatelet therapy.  -TLSO back brace when OOB  -Upright x-rays of the thoracic spine reviewed.  Limited visualization.  Multilevel compression fractures.  Bones are relatively demineralized in appearance.  - Has poor dentition, want dental clearance prior to starting on bone health therapy (bisphosphonates or Prolia as out patient).  -Patient's last dose of Xarelto was on 01/07/2023 or 01/08/2023.  -Appreciate neurosurgery input  -OT/PT recommends acute rehabilitation    #Bilateral acute and subacute pulmonary emboli  # Acute bilateral LE DVT  #Hx DVT 2012, prior ulcerative colitis, HTN - Noted.   -Patient was  cleared to restart Xarelto 10 mg p.o. nightly on 01/19/2023 by neurosurgery.  -Hematology recommends bridging with heparin gtt to eliquis upon her discharge.    -Consider Eliquis 5mg  twice daily instead of Xarelto at the time of discharge   - started heparin drip  - reviewed Venous Doppler ultrasound of bilateral lower extremities  - s/p temporary IVC filter on 7/28   - has been on RA, is not in distress  -Hematology input appreciated  - should follow up with Dr. Daisey Must who has been managing her AC previously     #Unintentional weight loss   #former smoker  -CT CAP reviewed  1.No evidence of metastatic disease in the chest, abdomen or pelvis.  2.Bilateral acute/subacute pulmonary emboli.  3.Redemonstration of deep venous thrombosis in the left superficial and  left common femoral veins. Mixing artifact versus thrombus in the right  common femoral and right external iliac veins.  4.Trace bilateral pleural effusions, left greater than right.  5.Thoracolumbar vertebral body fractures better assessed on the prior  lumbar spine MRI.  -Mirtazapine 7.5 mg p.o. nightly    DVT prophy: SCDs. Heparin drip.   Diet: regular  Code status: full  Status: inpatient                  Recent Labs   Lab 01/18/23  0438 01/16/23  0104 01/15/23  0328   Hemoglobin 11.8 11.1* 12.4   Hematocrit 35.8 32.4* 36.8   MCV 104.7* 103.2* 103.7*   WBC 6.11 6.99 7.83   Platelet Count 267 245 282  Anemia Diagnosis: Acute: Acute Anemia, Unspecified  Malnutrition Documentation    Severe malnutrition related to inadequate protein energy intake in the setting of acute illness as evidenced by  intake < 50% of estimated energy requirements for > 5 days, >7.5% weight loss x 3 months, moderate muscle depletion (temporalis, pectoralis major, gastrocnemius).            History of Presenting Illness and Interval History/24 hour Events:   CC: bilateral leg weakness, urinary incontinence     HPI per Admitting Provider  "  [name] is a 62 y.o.  female hx DVT 2012, prior ulcerative colitis, HTN who presents with bilateral leg weakness, urinary incontinence. This has been for 3 days. She has difficulty making to the bathroom so had urinary incontinence. She also notes low back pain. She is supposed to be on Xarelto but has not taken it for 2 days as she ran out. These symptoms are sudden onset, moderate intensity, without alleviating factors.  "    7/25: Patient reports that she fell into the wall approximately week ago.  She ran out of Xarelto anticoagulation medications on Thursday or Friday of last week.  She has not taken Xarelto for at least 6 days.  Patient does have intermittent low back pain.    7/26: Patient reports that her back pain is currently controlled.  Patient does have weakness of her bilateral lower extremity.  Her urinary incontinence has resolved.    7/27: Pt has no new complaints.  Her husband reports that she is non-compliant with medical follow up since the COVID pandemic.  Pt has had 15 lbs weight loss in 3-4 months.  She quit smoking 15 yrs ago when she was diagnosed with a DVT.    7/28: Pt has intermittent back pain.  Pt agrees that she has lost weight.       7/29: Patient still has a poor appetite.  She does not have insomnia.  Patient does have intermittent low back pain.  She denies any chest pain, shortness of breath, nausea, vomiting, or bowel changes.  7/30. On RA, more comfortable, no any acute events noted  Physical Exam:     Vitals:    01/19/23 0838   BP: 120/81   Pulse: 90   Resp:    Temp:    SpO2:        Intake and Output Summary (Last 24 hours) at Date Time    Intake/Output Summary (Last 24 hours) at 01/19/2023 0858  Last data filed at 01/19/2023 0556  Gross per 24 hour   Intake 100 ml   Output --   Net 100 ml         General: awake, alert, oriented x 3; no acute distress.  HEENT: Normocephalic, atraumatic. MMM  Neck: supple  Cardiovascular: regular rate and rhythm, no murmurs, rubs or gallops  Lungs: clear to  auscultation bilaterally, without wheezing, rhonchi, or rales  Abdomen: soft, non-tender, non-distended; no palpable masses, no hepatosplenomegaly, normoactive bowel sounds, no rebound or guarding  Extremities: no clubbing, cyanosis, or edema  Neuro: cranial nerves grossly intact, strength 5/5 in upper and 3/5 hip flexion and knee extension and 4+/5 dorsiflexion and plantarflexion, sensation intact, knee DTRs 2/4, follows commands  Skin: no rashes or lesions noted      Medications:     Current Facility-Administered Medications   Medication Dose Route Frequency    lidocaine  1 patch Transdermal Q24H    methocarbamol  500 mg Oral QID  metoprolol tartrate  50 mg Oral Q12H SCH    mirtazapine  7.5 mg Oral QHS    multivitamin  1 tablet Oral Daily    pantoprazole  40 mg Oral Daily    timolol  1 drop Left Eye Daily    vitamin D (ergocalciferol)  50,000 Units Oral Weekly         Labs:     Results       ** No results found for the last 24 hours. **              Radiology:     Radiology Results (24 Hour)       ** No results found for the last 24 hours. **            Lines  Patient Lines/Drains/Airways Status       Active PICC Line / CVC Line / PIV Line / Drain / Airway / Intraosseous Line / Epidural Line / ART Line / Line / Wound / Pressure Ulcer / NG/OG Tube       Name Placement date Placement time Site Days    Peripheral IV 01/13/23 18 G Standard Left Antecubital 01/13/23  1903  Antecubital  less than 1    Wound 05/25/19 Surgical Incision Eye Left eye pads, paper tape 05/25/19  1116  Eye  1329                           Signed by: Gertie Baron, MD, MD,  Cc    I have spent 50 minutes with the patient on counseling, chart review, and coordinating care

## 2023-01-19 NOTE — SLP Eval Note (Signed)
[facility]   Speech Language Pathology  SLP Clinical Bedside Swallow Evaluation     Patient: [name] MRN#: [id number] Room: I778/E423.53    Recommendations/Plan:   Recommendations:  Solids:  Regular Solids (RG7), continue  Liquids: Thin Liquids (IDDSI TN0) via, any modality  Meds: PO, whole, with liquid     Precautions:   Precautions/Compensations: alert and awake, upright positioning  Supervision: Independent    Plan:   Referrals: none  Plan of care: begin/continue oral diet, patient/family education, and no additional skilled SLP needs   SLP Frequency Recommended: one time visit  Discharge recommendations: No follow up required    Assessment:   Patient presents with unremarkable swallow function. Patient was awake and alert. OME was [facility]. With PO trials, swallow initiation was consistent, mastication was prompt/complete and no s/s of aspiration noted. Recommend cont' regular diet/thin liquids. Aspiration precautions. No further skilled SLP services indicated at this time. D/C SLP.    Therapy Diagnosis: WFL    Prognosis:  n/a    History of Present Illness:   History of Present Illness: [name] is a 62 y.o. female admitted on 01/13/2023 with "hx DVT 2012, prior ulcerative colitis, HTN who presents with bilateral leg weakness, urinary incontinence. This has been for 3 days. She has difficulty making to the bathroom so had urinary incontinence. She also notes low back pain. She is supposed to be on Xarelto but has not taken it for 2 days as she ran out. These symptoms are sudden onset, moderate intensity, without alleviating factors." (Per H&P)    SLP consulted for "swallowing slowly."    History of Intubation: Not intubated this admission    Imaging:  CT Chest Abdomen Pelvis W Contrast    Result Date: 01/17/2023  1.No evidence of metastatic disease in the chest, abdomen or pelvis. 2.Bilateral acute/subacute pulmonary emboli. 3.Redemonstration of deep venous thrombosis in the left  superficial and left common femoral veins. Mixing artifact versus thrombus in the right common femoral and right external iliac veins. 4.Trace bilateral pleural effusions, left greater than right. 5.Thoracolumbar vertebral body fractures better assessed on the prior lumbar spine MRI. Carolyn Stare, MD 01/17/2023 7:32 PM    Medical Diagnosis: Spinal stenosis of lumbosacral region [M48.07]    Medical History[1]    Speech Therapy or Relevant Medical History  None    Subjective:   Patient is agreeable to participation in the therapy session. Nursing clears patient for therapy. Patient's medical condition is appropriate for speech therapy intervention at this time.     Pt was awake and alert. No family present at bedside. She denies any swallowing issues. RN reports she tolerated her meals as well, just a little slow swallowing meds.    PAIN: no     Behavior/cognition:  awake, alert, cooperative, and follows simple commands    Objective:   Patient Status:    - Current diet: Regular solids (RG7) and Thin liquids (TN0)   - Position: in bed   - Medical equipment in place: IV    - Respiratory status: room air   - Interpreter services: n/a   - Precautions: fall and bleeding   - Food allergies: none listed    Oral Motor Skills and Voice Assessment:  Oral motor exam: WFL   Speech: WFL   Vocal Quality: clear vocal quality, judged severity: n/a     Oral Inspection:   Lips: moist/pink  Tongue: moist/pink  Saliva: WFL  Teeth: poor condition  Oral care provided:  no  Comments: n/a    PO Trials Presented:  Thin (TN0) via straw   Puree (PU4)  Regular Solids (RG7)    Oral Phase:  WFL     Pharyngeal Phase/Airway Protection:  present hyolaryngeal movement   single swallow per bolus  no overt signs or symptoms of aspiration     Patient left with call bell within reach, all needs met, fall mat in place, and all questions answered. RN notified of session outcome and patient response.   Educated the patient to role of speech therapy, plan of  care, goals of therapy and recommendations.     Goals:   N/a      Mary Sella, M.A. CCC-SLP  Available via Epic Chat  01/19/2023    PPE worn by provider: procedural mask and gloves     Time of Treatment:  SLP Received On: 01/19/23  Start Time: 1450  Stop Time: 1500  Time Calculation (min): 10 min        [1]   Past Medical History:  Diagnosis Date    Chronic ulcerative colitis     nio issues for long time    Claustrophobia     Clotting disorder     DVT LLE 2012    Deep venous thrombosis of calf 2012    LLL/on xeralto//    GERD (gastroesophageal reflux disease)     Glaucoma NEC     bilat    Hypertensive disorder     controlled well with medication    Ovarian cyst, right     Plantar fasciitis     Seasonal allergies

## 2023-01-19 NOTE — Plan of Care (Signed)
HEMATOLOGY  Plan of Care      Date Time: 01/19/23 9:06 AM  Patient Name: [name], [name]    Recommendations:     H/O DVT  Traumatic T10 compression fracture with small epidural hematoma  H/O ulcerative colitis  Anemia    Exact details of prior VTE history not entirely clear but based on available information, she has had 2 episodes of DVT in the past. As such, she should be considered at high risk for recurrent VTE. CT CAP without evidence of malignancy, but with DVT of the LLE and possible thrombus in the RLE.   Agree with resuming heparin gtt today and transition to DOAC on discharge  Patient should follow up with Dr. Daisey Must who has been managing her [facility] previously  Age appropriate cancer screening in the outpatient setting    Hematology to sign off at this time.   Please EPIC Chat "[facility] Benign Hematology" if any further questions or concerns.       Discussed with Dr. Zollie Pee.     Signed by:   Leanne Chang, DO  Hematology/Oncology Fellow  [facility]  [address]  [place], [place] [address]

## 2023-01-19 NOTE — PT Progress Note (Signed)
Physical Therapy Note    Physical Therapy Treatment  [name]  Post Acute Care Therapy Recommendations:     Discharge Recommendations:  SNF    If SNF  recommended discharge disposition is not available, patient will need min assist for mobility and HHPT.     DME needs IF patient is discharging home:  (JUNIOR rw)    Therapy discharge recommendations may change with patient status.  Please refer to most recent note for up-to-date recommendations.    Assessment:   Significant Findings: none    Assessment:  Pt was received supine in  bed when this PT arrived to the room and was agreeable to participating in PT.  Again, pt presents with very flat affect and when we had a conversation regarding going home or to SNF, she ws noncommittal.  This PT believes she will be more active at a SNF than if she went home so the rec is being changed to SNF.  See below for details of session.      Treatment Activities: bed mob and gt training    Educated the patient to role of physical therapy, plan of care, goals of therapy and safety with mobility and ADLs, spine precautions.    Informed patient  of the risks and benefits of participating in PT.    Plan:       PT Frequency: 3-4x/wk     Continue plan of care.    Unit: [facility] TOWER 8  Bed: F842/F842.01     Precautions and Contraindications:   Brace when OOB; may be put on sitting EOB  Spinal precautions  Falls      Subjective:   Pt reported I'm uncomfortable (sitting in the chair)    Patient is agreeable to participation in the therapy session. Nursing clears patient for therapy.    Patient's medical condition is appropriate for Physical Therapy intervention at this time.      Cognition: Patient is alert and oriented x 3 and follows directions without difficulty.     Objective:   Observation of Patient/Vital Signs:  Patient is in bed with IV running in place.    Postural Assessment:   Kyphotic     Pain:   Scale: no complaints of pain during this  session  Location:   Intervention:     Functional Mobility       Rolling: cg       Supine to Sit: cg       Sit to Supine: Not tested - pt left in bedside chair       Scooting: cg       Sit to Stand: cg with rw       Stand to Sit: cg       Transfers: cg with rw    PMP - Progressive Mobility Protocol   PMP Activity: Step 6 - Walks in Room  Distance Walked (ft) (Step 6,7): 15 Feet (x 2)     Ambulation:       WB Status: no restrictions        Assistive Device: rw       Assistance Level: cg       Distance: 15' x 2       Gait Pattern: tends to push walker too far ahead and then flex forward at the trunk       Stair Management: nt    Balance:   fair + with rw    Endurance:   not challenged  Patient Participation: good    Pt left sitting in bedside chair with needs met with RN informed of status as well as outcome of PT session.    Patient left with call bell and phone within reach, chair/bed alarm activated,, fall mat . All needs met and all questions answered.    Goals:  Goals  Goal Formulation: With patient/family  Time for Goal Acheivement: 3 visits  Goals: Select goal  Pt Will Go Supine To Sit: with supervision  Pt Will Perform Sit To Supine: with minimal assist  Pt Will Transfer Bed/Chair: with rolling walker, with supervision  Pt Will Ambulate: 51-100 feet, with rolling walker, with supervision  Pt Will Go Up / Down Stairs: 1 flight, with contact guard assist, With rail                              Luther Bradley, PT  Pager Number [phone]    Time of Treatment:  PT Received On: 01/19/23  Start Time: 1120  Stop Time: 1150  Time Calculation (min): 30 min    Treatment # 1 out of 3 visits

## 2023-01-19 NOTE — Plan of Care (Signed)
Alert x 4, forgetful at times. Call bell & table within reach, adequate lighting, bed alarm & floor mat, Avasys, and frequently rounded. Pt denied any pain, Robaxin 500 mg PO scheduled given, resting at this time. Ambulated to the bathroom with assist. Plan of care, continue to monitor neuro status and safety.     Problem: Moderate/High Fall Risk Score >5  Goal: Patient will remain free of falls  01/19/2023 0226 by Emogene Morgan, RN  Outcome: Progressing  01/19/2023 0226 by Emogene Morgan, RN  Outcome: Progressing     Problem: Pain interferes with ability to perform ADL  Goal: Pain at adequate level as identified by patient  01/19/2023 0226 by Emogene Morgan, RN  Outcome: Progressing  Flowsheets (Taken 01/19/2023 0226)  Pain at adequate level as identified by patient:   Assess pain on admission, during daily assessment and/or before any "as needed" intervention(s)   Evaluate if patient comfort function goal is met   Offer non-pharmacological pain management interventions  01/19/2023 0226 by Emogene Morgan, RN  Outcome: Progressing     Problem: Side Effects from Pain Analgesia  Goal: Patient will experience minimal side effects of analgesic therapy  01/19/2023 0226 by Emogene Morgan, RN  Outcome: Progressing  Flowsheets (Taken 01/19/2023 0226)  Patient will experience minimal side effects of analgesic therapy:   Monitor/assess patient's respiratory status (RR depth, effort, breath sounds)   Assess for changes in cognitive function   Prevent/manage side effects per LIP orders (i.e. nausea, vomiting, pruritus, constipation, urinary retention, etc.)   Evaluate for opioid-induced sedation with appropriate assessment tool (i.e. POSS)  01/19/2023 0226 by Emogene Morgan, RN  Outcome: Progressing     Problem: Impaired Mobility  Goal: Mobility/Activity is maintained at optimal level for patient  01/19/2023 0226 by Emogene Morgan, RN  Outcome: Progressing  01/19/2023 0226 by Emogene Morgan, RN  Outcome: Progressing     Problem: Peripheral  Neurovascular Impairment  Goal: Extremity color, movement, sensation are maintained or improved  01/19/2023 0226 by Emogene Morgan, RN  Outcome: Progressing  01/19/2023 0226 by Emogene Morgan, RN  Outcome: Progressing     Problem: Compromised skin integrity  Goal: Skin integrity is maintained or improved  01/19/2023 0226 by Emogene Morgan, RN  Outcome: Progressing  01/19/2023 0226 by Emogene Morgan, RN  Outcome: Progressing     Problem: Nutrition  Goal: Nutritional intake is adequate  01/19/2023 0226 by Emogene Morgan, RN  Outcome: Progressing  01/19/2023 0226 by Emogene Morgan, RN  Outcome: Progressing     Problem: Neurological Deficit  Goal: Neurological status is stable or improving  01/19/2023 0226 by Emogene Morgan, RN  Outcome: Progressing  01/19/2023 0226 by Emogene Morgan, RN  Outcome: Progressing     Problem: Potential for Aspiration  Goal: Risk of aspiration will be minimized  01/19/2023 0226 by Emogene Morgan, RN  Outcome: Progressing  Flowsheets (Taken 01/19/2023 0226)  Risk of aspiration will be minimized:   Place swallow precaution signage above bed and supervise patient during oral intake   Monitor/assess for signs of aspiration (tachypnea, cough, wheezing, clearing throat, hoarseness after eating, decrease in SaO2)   Instruct patient to take small bites, small single sips of liquid, and do not use a straw  01/19/2023 0226 by Emogene Morgan, RN  Outcome: Progressing     Problem: Communication Impairment  Goal: Will be able to express needs and understand communication  01/19/2023 0226 by  Emogene Morgan, RN  Outcome: Progressing  01/19/2023 0226 by Emogene Morgan, RN  Outcome: Progressing     Problem: Compromised Sensory Perception  Goal: Sensory Perception Interventions  01/19/2023 0226 by Emogene Morgan, RN  Outcome: Progressing  01/19/2023 0226 by Emogene Morgan, RN  Outcome: Progressing     Problem: Compromised Moisture  Goal: Moisture level Interventions  01/19/2023 0226 by Emogene Morgan, RN  Outcome: Progressing  01/19/2023  0226 by Emogene Morgan, RN  Outcome: Progressing     Problem: Compromised Activity/Mobility  Goal: Activity/Mobility Interventions  01/19/2023 0226 by Emogene Morgan, RN  Outcome: Progressing  01/19/2023 0226 by Emogene Morgan, RN  Outcome: Progressing     Problem: Compromised Nutrition  Goal: Nutrition Interventions  01/19/2023 0226 by Emogene Morgan, RN  Outcome: Progressing  01/19/2023 0226 by Emogene Morgan, RN  Outcome: Progressing     Problem: Compromised Friction/Shear  Goal: Friction and Shear Interventions  01/19/2023 0226 by Emogene Morgan, RN  Outcome: Progressing  01/19/2023 0226 by Emogene Morgan, RN  Outcome: Progressing     Problem: Safety  Goal: Patient will be free from injury during hospitalization  Outcome: Progressing  Flowsheets (Taken 01/19/2023 0226)  Patient will be free from injury during hospitalization:   Assess patient's risk for falls and implement fall prevention plan of care per policy   Provide and maintain safe environment   Use appropriate transfer methods   Ensure appropriate safety devices are available at the bedside   Include patient/ family/ care giver in decisions related to safety   Hourly rounding  Goal: Patient will be free from infection during hospitalization  Outcome: Progressing  Flowsheets (Taken 01/19/2023 0226)  Free from Infection during hospitalization:   Assess and monitor for signs and symptoms of infection   Monitor lab/diagnostic results   Monitor all insertion sites (i.e. indwelling lines, tubes, urinary catheters, and drains)     Problem: Pain  Goal: Pain at adequate level as identified by patient  01/19/2023 0226 by Emogene Morgan, RN  Outcome: Progressing  Flowsheets (Taken 01/19/2023 0226)  Pain at adequate level as identified by patient:   Assess pain on admission, during daily assessment and/or before any "as needed" intervention(s)   Evaluate if patient comfort function goal is met   Offer non-pharmacological pain management interventions  01/19/2023 0226 by Emogene Morgan, RN  Outcome: Progressing     Problem: Discharge Barriers  Goal: Patient will be discharged home or other facility with appropriate resources  Outcome: Progressing     Problem: Psychosocial and Spiritual Needs  Goal: Demonstrates ability to cope with hospitalization/illness  Outcome: Progressing  Flowsheets (Taken 01/19/2023 0226)  Demonstrates ability to cope with hospitalizations/illness:   Encourage verbalization of feelings/concerns/expectations   Provide quiet environment

## 2023-01-20 LAB — CBC
Absolute nRBC: 0 10*3/uL (ref <=0.00)
Absolute nRBC: 0 10*3/uL (ref <=0.00)
Hematocrit: 31.4 % — ABNORMAL LOW (ref 34.7–43.7)
Hematocrit: 33.7 % — ABNORMAL LOW (ref 34.7–43.7)
Hemoglobin: 10.3 g/dL — ABNORMAL LOW (ref 11.4–14.8)
Hemoglobin: 11 g/dL — ABNORMAL LOW (ref 11.4–14.8)
MCH: 34.1 pg — ABNORMAL HIGH (ref 25.1–33.5)
MCH: 34.3 pg — ABNORMAL HIGH (ref 25.1–33.5)
MCHC: 32.8 g/dL (ref 31.5–35.8)
MCHC: 32.6 g/dL (ref 31.5–35.8)
MCV: 104 fL — ABNORMAL HIGH (ref 78.0–96.0)
MCV: 105 fL — ABNORMAL HIGH (ref 78.0–96.0)
MPV: 10.4 fL (ref 8.9–12.5)
MPV: 11.2 fL (ref 8.9–12.5)
Platelet Count: 223 10*3/uL (ref 142–346)
Platelet Count: 221 10*3/uL (ref 142–346)
RBC: 3.02 10*6/uL — ABNORMAL LOW (ref 3.90–5.10)
RBC: 3.21 10*6/uL — ABNORMAL LOW (ref 3.90–5.10)
RDW: 15 % (ref 11–15)
RDW: 15 % (ref 11–15)
WBC: 4.98 10*3/uL (ref 3.10–9.50)
WBC: 6.63 10*3/uL (ref 3.10–9.50)
nRBC %: 0 /100 WBC (ref <=0.0)
nRBC %: 0 /100 WBC (ref <=0.0)

## 2023-01-20 LAB — BASIC METABOLIC PANEL
Anion Gap: 9 (ref 5.0–15.0)
BUN: 4 mg/dL — ABNORMAL LOW (ref 7–21)
CO2: 20 mEq/L (ref 17–29)
Calcium: 8 mg/dL — ABNORMAL LOW (ref 8.5–10.5)
Chloride: 109 mEq/L (ref 99–111)
Creatinine: 0.7 mg/dL (ref 0.4–1.0)
GFR: >60 mL/min/{1.73_m2} (ref >=60.0)
Glucose: 97 mg/dL (ref 70–100)
Hemolysis Index: 9 Index
Potassium: 3.6 mEq/L (ref 3.5–5.3)
Sodium: 138 mEq/L (ref 135–145)

## 2023-01-20 LAB — ANTI-XA,UFH
Anti-Xa, UFH: 0.19 IU/mL
Anti-Xa, UFH: 0.48 IU/mL

## 2023-01-20 MED ORDER — APIXABAN 5 MG PO TABS
5.0000 mg | ORAL_TABLET | Freq: Two times a day (BID) | ORAL | Status: DC
Start: 2023-01-20 — End: 2023-01-22
  Administered 2023-01-20 – 2023-01-22 (×4): 5 mg via ORAL
  Filled 2023-01-20 (×4): qty 1

## 2023-01-20 MED ORDER — APIXABAN 5 MG PO TABS
5.0000 mg | ORAL_TABLET | Freq: Two times a day (BID) | ORAL | Status: DC
Start: 2023-01-20 — End: 2023-01-20

## 2023-01-20 NOTE — Progress Notes (Addendum)
[facility] HOSPITALIST PROGRESS NOTE    Date Time: 01/20/23 8:55 AM  Patient Name: [name]  Attending Physician: Gertie Baron, MD      Assessment   62 y.o. female hx DVT 2012, prior ulcerative colitis, HTN who presents with bilateral leg weakness, urinary incontinence likely secondary to 6 mm fluid collection at the T12 vertebrae extending to the mid inferior T11 and mid L1 indenting the anterior thecal sac likely from a small hematoma.      Plan   #Back pain from an acute T10 vertebral fracture with 2 mm retropulsion  #Bilateral leg weakness, urinary incontinence likely from focal fluid collection measuring 6 mm extending from the inferior T11 to mid L1 level causing indentation of the anterior thecal sac displacing the thecal sac likely from an epidural small hematoma  #Subacute T11 vertebral fracture, and subacute T12 vertebral fracture  #Old L1-L3 compression fractures  -Transfer patient to the neuro [facility]  -Neurochecks every 2 hours  -Fall, aspiration, delirium precautions  -Analgesia CK normal.   -CT lumbar spine suggested L3-L4 canal stenosis. It also mentioned T12 fracture new since 01/20/21 but likely chronic.   -MRI lumbar spine w/wo contrast reviewed.  -Neurosurgery was consulted for vertebral fractures and possible epidural small hematoma at the T12 level.  -No NSAIDs, anticoagulation, or antiplatelet therapy.  -TLSO back brace when OOB  -Upright x-rays of the thoracic spine reviewed.  Limited visualization.  Multilevel compression fractures.  Bones are relatively demineralized in appearance.  - Has poor dentition, want dental clearance prior to starting on bone health therapy (bisphosphonates or Prolia as out patient).  -Patient's last dose of Xarelto was on 01/07/2023 or 01/08/2023.  -Appreciate neurosurgery input  -OT/PT recommends SNF placement     #Bilateral acute and subacute pulmonary emboli  # Acute bilateral LE DVT  #Hx DVT 2012, prior ulcerative colitis, HTN - Noted.   -Patient was cleared  to restart Xarelto 10 mg p.o. nightly on 01/19/2023 by neurosurgery.  - reviewed Venous Doppler ultrasound of bilateral lower extremities  - s/p temporary IVC filter on 7/28 -> needs to come out in 03/2023 with IR team  - -Hematology recommends bridging with heparin gtt to eliquis upon her discharge--> started Eliquis since 7/31--> can be discharged on 8/01 if stable.    - discontinued heparin drip on 7/31  - has been on RA, is not in distress  -Hematology input appreciated  - should follow up with Dr. Daisey Must who has been managing her [facility] previously     #Unintentional weight loss   #former smoker  -CT CAP reviewed  1.No evidence of metastatic disease in the chest, abdomen or pelvis.  2.Bilateral acute/subacute pulmonary emboli.  3.Redemonstration of deep venous thrombosis in the left superficial and  left common femoral veins. Mixing artifact versus thrombus in the right  common femoral and right external iliac veins.  4.Trace bilateral pleural effusions, left greater than right.  5.Thoracolumbar vertebral body fractures better assessed on the prior  lumbar spine MRI.  -Mirtazapine 7.5 mg p.o. nightly    DVT prophy: SCDs. Heparin drip.   Diet: regular  Code status: full  Status: inpatient                     Recent Labs   Lab 01/20/23  0447 01/18/23  0438 01/16/23  0104   Hemoglobin 10.3* 11.8 11.1*   Hematocrit 31.4* 35.8 32.4*   MCV 104.0* 104.7* 103.2*   WBC 4.98 6.11 6.99  Platelet Count 223 267 245         Anemia Diagnosis: Acute: Acute Anemia, Unspecified  Malnutrition Documentation    Severe malnutrition related to inadequate protein energy intake in the setting of acute illness as evidenced by  intake < 50% of estimated energy requirements for > 5 days, >7.5% weight loss x 3 months, moderate muscle depletion (temporalis, pectoralis major, gastrocnemius).            History of Presenting Illness and Interval History/24 hour Events:   CC: bilateral leg weakness, urinary incontinence     HPI per Admitting  Provider  "  [name] is a 62 y.o. female hx DVT 2012, prior ulcerative colitis, HTN who presents with bilateral leg weakness, urinary incontinence. This has been for 3 days. She has difficulty making to the bathroom so had urinary incontinence. She also notes low back pain. She is supposed to be on Xarelto but has not taken it for 2 days as she ran out. These symptoms are sudden onset, moderate intensity, without alleviating factors.  "    7/25: Patient reports that she fell into the wall approximately week ago.  She ran out of Xarelto anticoagulation medications on Thursday or Friday of last week.  She has not taken Xarelto for at least 6 days.  Patient does have intermittent low back pain.    7/26: Patient reports that her back pain is currently controlled.  Patient does have weakness of her bilateral lower extremity.  Her urinary incontinence has resolved.    7/27: Pt has no new complaints.  Her husband reports that she is non-compliant with medical follow up since the COVID pandemic.  Pt has had 15 lbs weight loss in 3-4 months.  She quit smoking 15 yrs ago when she was diagnosed with a DVT.    7/28: Pt has intermittent back pain.  Pt agrees that she has lost weight.       7/29: Patient still has a poor appetite.  She does not have insomnia.  Patient does have intermittent low back pain.  She denies any chest pain, shortness of breath, nausea, vomiting, or bowel changes.  7/30. On RA, more comfortable, no any acute events noted  7/31. No any acute events noted, on RA, awake and alert.   Physical Exam:     Vitals:    01/20/23 0730   BP: (!) 137/93   Pulse: 77   Resp:    Temp: 97.7 F (36.5 C)   SpO2: 99%       Intake and Output Summary (Last 24 hours) at Date Time  No intake or output data in the 24 hours ending 01/20/23 0855        General: awake, alert, oriented x 3; no acute distress.  HEENT: Normocephalic, atraumatic. MMM  Neck: supple  Cardiovascular: regular rate and rhythm, no murmurs, rubs or  gallops  Lungs: clear to auscultation bilaterally, without wheezing, rhonchi, or rales  Abdomen: soft, non-tender, non-distended; no palpable masses, no hepatosplenomegaly, normoactive bowel sounds, no rebound or guarding  Extremities: no clubbing, cyanosis, or edema  Neuro: cranial nerves grossly intact, strength 5/5 in upper and 3/5 hip flexion and knee extension and 4+/5 dorsiflexion and plantarflexion, sensation intact, knee DTRs 2/4, follows commands  Skin: no rashes or lesions noted      Medications:     Current Facility-Administered Medications   Medication Dose Route Frequency    lidocaine  1 patch Transdermal Q24H    methocarbamol  500 mg Oral QID    metoprolol tartrate  50 mg Oral Q12H SCH    mirtazapine  7.5 mg Oral QHS    multivitamin  1 tablet Oral Daily    pantoprazole  40 mg Oral Daily    timolol  1 drop Left Eye Daily    vitamin D (ergocalciferol)  50,000 Units Oral Weekly         Labs:     Results       Procedure Component Value Units Date/Time    CBC without Differential [[id number]]  (Abnormal) Collected: 01/20/23 0447    Specimen: Blood, Venous Updated: 01/20/23 0732     WBC 4.98 x10 3/uL      Hemoglobin 10.3 g/dL      Hematocrit 40.9 %      Platelet Count 223 x10 3/uL      MPV 10.4 fL      RBC 3.02 x10 6/uL      MCV 104.0 fL      MCH 34.1 pg      MCHC 32.8 g/dL      RDW 15 %      nRBC % 0.0 /100 WBC      Absolute nRBC 0.00 x10 3/uL     Basic Metabolic Panel [[id number]]  (Abnormal) Collected: 01/20/23 0447    Specimen: Blood, Venous Updated: 01/20/23 0700     Glucose 97 mg/dL      BUN 4 mg/dL      Creatinine 0.7 mg/dL      Calcium 8.0 mg/dL      Sodium 956 mEq/L      Potassium 3.6 mEq/L      Chloride 109 mEq/L      CO2 20 mEq/L      Anion Gap 9.0     GFR >60.0 mL/min/1.73 m2      Hemolysis Index 9 Index     Anti-Xa, UFH [[id number]]  (Normal) Collected: 01/20/23 0447    Specimen: Blood, Venous Updated: 01/20/23 0525     Anti-Xa, UFH 0.19 IU/mL     Anti-Xa, UFH [[id number]]  (Normal) Collected:  01/19/23 2016    Specimen: Blood, Venous Updated: 01/19/23 2045     Anti-Xa, UFH 0.40 IU/mL     APTT [[id number]]  (Normal) Collected: 01/19/23 0921    Specimen: Blood, Venous Updated: 01/19/23 1005     PTT 30 sec     Anti-Xa, UFH [[id number]]  (Normal) Collected: 01/19/23 0921    Specimen: Blood, Venous Updated: 01/19/23 1005     Anti-Xa, UFH <0.04 IU/mL               Radiology:     Radiology Results (24 Hour)       ** No results found for the last 24 hours. **            Lines  Patient Lines/Drains/Airways Status       Active PICC Line / CVC Line / PIV Line / Drain / Airway / Intraosseous Line / Epidural Line / ART Line / Line / Wound / Pressure Ulcer / NG/OG Tube       Name Placement date Placement time Site Days    Peripheral IV 01/13/23 18 G Standard Left Antecubital 01/13/23  1903  Antecubital  less than 1    Wound 05/25/19 Surgical Incision Eye Left eye pads, paper tape 05/25/19  1116  Eye  1329  Signed by: Gertie Baron, MD, MD,  Cc    I have spent 50 minutes with the patient on counseling, chart review, and coordinating care

## 2023-01-20 NOTE — Plan of Care (Signed)
Procedure(s):  Temporary IVC FILTER PLACEMENT    4 Days Post-Op  -------------------     Patient Lines/Drains/Airways Status       Active Lines, Drains and Airways       Name Placement date Placement time Site Days    Peripheral IV 01/20/23 20 G Left Antecubital 01/20/23  0230  Antecubital  less than 1    Venous Sheath 8 Fr. Right Femoral 01/16/23  1357  Femoral  3                  Pt A&O X4. VSS.  Tolerated whole pills with thin liquids.  Pt able to ambulate to toilet c RW and assist as well as reposition self in bed without assistance.   Encouraged TCDB and IS use.   Continent of bowel and bladder. LBM 7/30 (pt verbalized but not witnessed). Pt voiding via toilet  Good pedal pulses bilaterally,cap refill <3 sec in all 4 extremities.  All dressings are clean, dry, and intact and all sites appear free from visual signs of infection.    Call bell and bedside table within reach at all times. Will continue to monitor patient  closely.     Notable Shift Events  >>> antiXa at 1200 therapeutic and no change in heparin.  Pt assisted to toilet several times this shift and pt denies pain at this time.  Most Recent Set of Vitals   Visit Vitals  BP (!) 137/93   Pulse 77   Temp 97.7 F (36.5 C) (Oral)   Resp 17   Ht 1.575 m (5' 2.01")   Wt 52 kg (114 lb 10.2 oz)   SpO2 99%   BMI 20.96 kg/m           Ladell Pier, RN  01/20/23  12:59 PM                                Problem: Moderate/High Fall Risk Score >5  Goal: Patient will remain free of falls  Outcome: Progressing     Problem: Pain interferes with ability to perform ADL  Goal: Pain at adequate level as identified by patient  Outcome: Progressing     Problem: Side Effects from Pain Analgesia  Goal: Patient will experience minimal side effects of analgesic therapy  Outcome: Progressing     Problem: Impaired Mobility  Goal: Mobility/Activity is maintained at optimal level for patient  Outcome: Progressing     Problem: Peripheral Neurovascular Impairment  Goal: Extremity  color, movement, sensation are maintained or improved  Outcome: Progressing     Problem: Compromised skin integrity  Goal: Skin integrity is maintained or improved  Outcome: Progressing     Problem: Nutrition  Goal: Nutritional intake is adequate  Outcome: Progressing     Problem: Neurological Deficit  Goal: Neurological status is stable or improving  Outcome: Progressing     Problem: Potential for Aspiration  Goal: Risk of aspiration will be minimized  Outcome: Progressing     Problem: Communication Impairment  Goal: Will be able to express needs and understand communication  Outcome: Progressing     Problem: Compromised Sensory Perception  Goal: Sensory Perception Interventions  Outcome: Progressing     Problem: Compromised Moisture  Goal: Moisture level Interventions  Outcome: Progressing     Problem: Compromised Activity/Mobility  Goal: Activity/Mobility Interventions  Outcome: Progressing     Problem: Compromised Nutrition  Goal: Nutrition Interventions  Outcome: Progressing  Problem: Compromised Friction/Shear  Goal: Friction and Shear Interventions  Outcome: Progressing     Problem: Safety  Goal: Patient will be free from injury during hospitalization  Outcome: Progressing  Goal: Patient will be free from infection during hospitalization  Outcome: Progressing     Problem: Pain  Goal: Pain at adequate level as identified by patient  Outcome: Progressing     Problem: Discharge Barriers  Goal: Patient will be discharged home or other facility with appropriate resources  Outcome: Progressing     Problem: Psychosocial and Spiritual Needs  Goal: Demonstrates ability to cope with hospitalization/illness  Outcome: Progressing

## 2023-01-20 NOTE — Plan of Care (Signed)
Problem: Moderate/High Fall Risk Score >5  Goal: Patient will remain free of falls  Outcome: Progressing     Problem: Pain interferes with ability to perform ADL  Goal: Pain at adequate level as identified by patient  Outcome: Progressing     Problem: Side Effects from Pain Analgesia  Goal: Patient will experience minimal side effects of analgesic therapy  Outcome: Progressing     Problem: Impaired Mobility  Goal: Mobility/Activity is maintained at optimal level for patient  Outcome: Progressing     Problem: Peripheral Neurovascular Impairment  Goal: Extremity color, movement, sensation are maintained or improved  Outcome: Progressing     Problem: Compromised skin integrity  Goal: Skin integrity is maintained or improved  Outcome: Progressing     Problem: Nutrition  Goal: Nutritional intake is adequate  Outcome: Progressing     Problem: Neurological Deficit  Goal: Neurological status is stable or improving  Outcome: Progressing     Problem: Potential for Aspiration  Goal: Risk of aspiration will be minimized  Outcome: Progressing     Problem: Communication Impairment  Goal: Will be able to express needs and understand communication  Outcome: Progressing     Problem: Compromised Sensory Perception  Goal: Sensory Perception Interventions  Outcome: Progressing     Problem: Compromised Moisture  Goal: Moisture level Interventions  Outcome: Progressing     Problem: Compromised Activity/Mobility  Goal: Activity/Mobility Interventions  Outcome: Progressing     Problem: Compromised Nutrition  Goal: Nutrition Interventions  Outcome: Progressing     Problem: Compromised Friction/Shear  Goal: Friction and Shear Interventions  Outcome: Progressing     Problem: Safety  Goal: Patient will be free from injury during hospitalization  Outcome: Progressing  Goal: Patient will be free from infection during hospitalization  Outcome: Progressing     Problem: Pain  Goal: Pain at adequate level as identified by patient  Outcome:  Progressing     Problem: Discharge Barriers  Goal: Patient will be discharged home or other facility with appropriate resources  Outcome: Progressing     Problem: Psychosocial and Spiritual Needs  Goal: Demonstrates ability to cope with hospitalization/illness  Outcome: Progressing

## 2023-01-20 NOTE — Provider Clarification Note (Signed)
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  Based on this information and the findings below, please confirm, clarify or rule out the type of ulcer, location, stage and present on admission status?    History/Risk Factors: 62 y.o. with PMH HTN and malnutrition, presents with bilateral leg weakness, urinary incontinence found to have spinal fractures, acute and subacute pulmonary emboli, and acute bilateral LE DVTs.    Clinical Indicators:  Consults by Stasia Cavalier, RN at 01/14/2023 4:04 PM:  Wound 01/13/23 Pressure Injury Sacrum (Active)   Date First Assessed: 01/13/23   Primary Wound Type: Pressure Injury  Location: Sacrum  Wound Description: pale open wound  Present on Original Admission: Yes       Assessments 01/14/2023  4:00 PM   Wound Base Description Slick   Pressure Injury Stage Stage 3   Pressure injury stage(Read Only) Stage 3   WOCN verified Pressure Staging Yes   Wound Length (cm) 4 cm   Wound Width (cm) 5 cm   Wound Surface Area (cm^2) 20 cm^2   Drainage Amount Small   Drainage Description Serous   Topical Hydrophillic Wound Paste   Dressing Foam       Wound 01/13/23 Pressure Injury (Active)   Date First Assessed: 01/13/23   Primary Wound Type: Pressure Injury  Wound Description: upside down V shape along gluteal crease edge  Present on Original Admission: Yes       Assessments 01/14/2023  4:00 PM   Pressure Injury Stage Deep Tissue   Pressure injury stage(Read Only) Deep Tissue   WOCN verified Pressure Staging Yes   Wound Length (cm) 12 cm   Wound Width (cm) 12 cm   Wound Surface Area (cm^2) 144 cm^2   Topical Balsam of [place]/Caster Oil       Treatment: Wound care consult, wound cleansing/dressing changes, pressure reduction and frequent repositioning    Question: Is there an additional  diagnosis that is clinically appropriate for this patient?    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

## 2023-01-20 NOTE — Nursing Progress Note (Addendum)
Procedure(s):  Temporary IVC FILTER PLACEMENT    4 Days Post-Op  -------------------         Pt A&O X4. VSS.  Tolerated whole pills with thin liquids.  Pt able to ambulate to bathroom with walker    Encouraged IS use.   Continent of bowel and incontinent of bladder. LBM 01/19/23. Pt voiding via toilet   Pain Controlled with Oxycodone 5mg   Good pedal pulses bilaterally  Call bell and bedside table within reach at all times.   Notable Shift Events  Foam dressing to buttocks is CDI, replaced after Pt removed it, Pt has attempted to exit the bed multiple times even with [facility] monitoring in place, Pt is not being redirected even with bed alarm, [facility] alarm, or [facility] staff speaking with her, Clin tech sitting outside Pt room for safety    Vladimir Creeks, RN  01/20/23  6:28 AM

## 2023-01-20 NOTE — PT Progress Note (Signed)
[facility]   Physical Therapy Cancellation Note      Patient:  [name] MRN#:  [id number]  Unit:  [facility] TOWER 8 Room/Bed:  F643/P295.18    01/20/2023  Time: 11:22 AM       PT Cancellation: Visit  PT Visit Cancellation Reason: Patient/caregiver declines therapy at this time ("can we do tomorrow")         Luther Bradley, PT  Pager: [phone]

## 2023-01-20 NOTE — OT Progress Note (Signed)
Occupational Therapy Treatment  Linlee Lizabeth Leyden        Post Acute Care Therapy Recommendations:     Discharge Recommendations:  SNF    If SNF  recommended discharge disposition is not available, patient will need hands on assist for functional mobility and ADLs and HHOT, home health aide, 1st floor set-up, skilled transport in/out of the home, and family training.     DME needs IF patient is discharging home: Front wheel walker, BSC, Long-handled sponge, Shower chair    Therapy discharge recommendations may change with patient status.  Please refer to most recent note for up-to-date recommendations.    Assessment:   Significant Findings: None    RN approved pt for therapy and pt semi-supine in bed upon OT arrival. Pt initially with minimal confusion on how to change the channel on the TV- provided demonstration and pt able to demonstrate teachback. RN made aware and reports pt having intermittent confusion. Pt oriented x 4 and able to recall 3/3 spinal precautions, which is improved from the previous sessions. Pt completed bed mobility, short distance mobility with the RW, and grooming tasks at the sink as detailed below under functional measures. Pt continues to demonstrate limited activity tolerance and endorsed high back pain levels; therefore, provided extensive education on taking pain medications prior to therapy sessions for improved tolerance. Pt is functioning below PLOF and will benefit from continued acute OT services to maximize independence and safety with ADLs and functional transfers/mobility.      Treatment Activities: ADL retraining, functional t/f training, dynamic balance, pt education, compensatory education, DME education     Educated the patient to role of occupational therapy, plan of care, goals of therapy and safety with mobility and ADLs, energy conservation techniques, spine precautions, home safety.    Plan:    OT Frequency Recommended: 3-4x/wk     Continue plan of care.    Unit: [facility] TOWER 8  Bed: F842/F842.01       Precautions and Contraindications:   Falls  Spinal precautions  TLSO OOB- pt may doff/donn EOB   Pt may doff TLSO for showering     Updated Medical Status/Imaging/Labs:  Reviewed     Subjective: "I don't know how to change the channel."   Patient's medical condition is appropriate for Occupational Therapy intervention at this time.  Patient is agreeable to participation in the therapy session. Nursing clears patient for therapy.    Pain:   Scale: 7/10  Location: Low back  Intervention: Repositioned, rest, education on spinal precaution adherence    Objective:   Patient is in bed with peripheral IV in place.  Pt wore mask during therapy session:No      Cognition  Pt alert and oriented x 4. Pt able to follow simple commands with min increased time for processing and intermittent repetition. Note decreased STM and poor problem solving.     Functional Mobility  Rolling:  SPV  Supine to Sit: SBA- mod cues for log roll technique  Sit to Stand: CGA  Transfers: CGA with RW for short household distances; min cues for RW management    PMP Activity: Step 6 - Walks in Room    Balance  Static Sitting: SPV  Dynamic Sitting: SBA  Static Standing: CGA with RW    Self Care and Home Management  Eating: Independent  Grooming: CGA standing at the sink  Bathing: Mod A - seated, simulated   UE Dressing: Mod A - assist for  TLSO (need assist for proper positioning and strap management; pt able to assist with strap management)  LE Dressing: Mod A- utilizing AE for footwear; pt denying practice today  Toileting: CGA utilizing toilet    Therapeutic Exercises  Incorporated throughout functional tasks    Participation: Fair+  Endurance: Fair+    Patient left with call bell within reach, all needs met, SCDs off as found, fall mat in place, bed alarm on, chair alarm N/A and all questions answered. RN notified of session outcome and patient response.     Goals:  Time For Goal Achievement: 10  visits  ADL Goals  Patient will groom self: Supervision, at sinkside  Patient will dress lower body: Stand by Assist, with AE  Patient will toilet: Stand by Assist  Mobility and Transfer Goals  Pt will perform functional transfers: Stand by Assist, with rolling walker  Neuro Re-Ed Goals  Pt will perform dynamic standing balance: for 10 minutes, Stand by Assist  Executive Fucntion Goals  Pt will follow spine precautions: Recall 3/3, to increase ability to complete ADLs, with supervision    PPE worn during session: procedural mask and gloves    Tech present: no  PPE worn by tech: N/A    Raliegh Ip, OTR/L   Pager #: [phone]     Time of Treatment  OT Received On: 01/20/23  Start Time: 1505  Stop Time: 1535  Time Calculation (min): 30 min    Treatment # 3 of 10 visits

## 2023-01-21 DIAGNOSIS — I82401 Acute embolism and thrombosis of unspecified deep veins of right lower extremity: Secondary | ICD-10-CM

## 2023-01-21 NOTE — OT Progress Note (Signed)
Occupational Therapy Treatment  Kseniya Lizabeth Leyden        Post Acute Care Therapy Recommendations:     Discharge Recommendations:  SNF    If SNF  recommended discharge disposition is not available, patient will need hands on assist for functional mobility and ADLs and HHOT, home health aide, 1st floor set-up, skilled transport in/out of the home, and family training.     DME needs IF patient is discharging home: Front wheel walker, BSC, Long-handled sponge, Shower chair    Therapy discharge recommendations may change with patient status.  Please refer to most recent note for up-to-date recommendations.    Assessment:   Significant Findings: None    RN approved pt for therapy and pt semi-supine in bed upon OT arrival. Pt completed bed mobility, UB/LB dressing, and short distance mobility with the RW x 3 as detailed below under functional measures. Pt required mod cues during tasks for AE management, problem solving, and RW management. Pt continues to demonstrate low motivation for therapy and requires significant encouragement for participation. Additionally note poor carryover between sessions, especially in reference to TLSO management and AE for LB dressing. Encouraged pt to create video on her phone to promote carryover of brace management; however, pt refusing at this time. Provided education on importance of pressure relief bed level to promote pressure sore healing- pt reporting subjective below, so recommend further follow up in the upcoming sessions. Pt is functioning below PLOF and will benefit from continued acute OT services to maximize independence and safety with ADLs and functional transfers/mobility.      Treatment Activities: ADL retraining, functional t/f training, dynamic balance, pt education, compensatory education, DME education     Educated the patient to role of occupational therapy, plan of care, goals of therapy and safety with mobility and ADLs, energy conservation techniques, spine  precautions, home safety.    Plan:    OT Frequency Recommended: 3-4x/wk     Continue plan of care.    Unit: [facility] TOWER 8  Bed: F842/F842.01       Precautions and Contraindications:   Falls  Spinal precautions  TLSO OOB- pt may doff/donn EOB   Pt may doff TLSO for showering     Updated Medical Status/Imaging/Labs:  Reviewed     Subjective: "I will change position when I start to feel the pain." -Pt with poor understanding of pressure relief education    Patient's medical condition is appropriate for Occupational Therapy intervention at this time.  Patient is agreeable to participation in the therapy session. Nursing clears patient for therapy.    Pain:   Scale: 7/10  Location: Low back  Intervention: Repositioned, rest, education on spinal precaution adherence    Objective:   Patient is in bed with peripheral IV in place.  Pt wore mask during therapy session:No      Cognition  Pt alert and oriented x 4. Pt able to follow simple commands with min increased time for processing and intermittent repetition. Note decreased STM and poor problem solving. Flat affect.    Functional Mobility  Rolling:  SPV  Supine to Sit: SBA- mod cues for log roll technique  Sit to Stand: CGA  Transfers: CGA with RW for short household distances; min-mod cues for RW management    PMP Activity: Step 6 - Walks in Room    Balance  Static Sitting: SPV  Dynamic Sitting: SBA  Static Standing: CGA with RW    Self Care and Home  Management  Eating: Independent  Grooming: CGA standing at the sink  Bathing: Mod A - seated, simulated   UE Dressing: Mod A - assist for TLSO (needs assist for proper positioning and strap management; pt able to assist with strap management)  LE Dressing: Mod A- utilizing AE- pt doffed/donned socks and underwear with the reacher and sock aid; mod cues and intermittent physical assistance required, especially for reacher management for underwear; pt may progress to utilizing tailor sit   Toileting: CGA  utilizing toilet    Therapeutic Exercises  Incorporated throughout functional tasks    Participation: Fair+  Endurance: Fair+    Patient left with call bell within reach, all needs met, SCDs off as found, fall mat in place, bed alarm on, chair alarm N/A and all questions answered. RN notified of session outcome and patient response.     Goals:  Time For Goal Achievement: 10 visits  ADL Goals  Patient will groom self: Supervision, at sinkside  Patient will dress lower body: Stand by Assist, with AE  Patient will toilet: Stand by Assist  Mobility and Transfer Goals  Pt will perform functional transfers: Stand by Assist, with rolling walker  Neuro Re-Ed Goals  Pt will perform dynamic standing balance: for 10 minutes, Stand by Assist  Executive Fucntion Goals  Pt will follow spine precautions: Recall 3/3, to increase ability to complete ADLs, with supervision    PPE worn during session: procedural mask and gloves    Tech present: no  PPE worn by tech: N/A    Raliegh Ip, OTR/L   Pager #: [phone]     Time of Treatment  OT Received On: 01/21/23  Start Time: 1400  Stop Time: 1430  Time Calculation (min): 30 min    Treatment # 4 of 10 visits

## 2023-01-21 NOTE — Progress Notes (Signed)
WOC nurse's note    Follow up for sacral wounds    The wound area is improving slightly with continued erythema         The largest wound last week was on the L buttocks and now it is dry and healing (~3x2cm).  The largest wound this week is the sacral area (~3x3cm stage 3 now) which has evolved from POA DTI.  There is now MASD in the gluteal cleft and small scattered wounds on the R edge of the gluteal fold.  Triad continues to be a good choice for primary dressing.  It can be used with sacral foam for extra padding and protection.  Encouraged her to turn herself to offload the wound area as much as possible.    WOC nurse will continue to follow    Rip Harbour MSN, RN, [place]  [id number]

## 2023-01-21 NOTE — PT Progress Note (Signed)
Physical Therapy Note     Physical Therapy Treatment  [name]  Post Acute Care Therapy Recommendations:     Discharge Recommendations:  SNF    If SNF  recommended discharge disposition is not available, patient will need hands on assist for functional mobility, transport into home and HHPT.     DME needs IF patient is discharging home:  (junior RW)    Therapy discharge recommendations may change with patient status.  Please refer to most recent note for up-to-date recommendations.    Assessment:   Significant Findings: none    Pt received in bed, agreeable to therapy. Pt limited by back pain but agreeable to OOB mobility. Able to complete all bed mobility with SBA and requires total A to don TLSO. Tolerated ambulation in hallway with CGA using RW with cueing for proximity to RW and upright posture - reports some relief of back pain. Returned to bed at end of session. Pt would continue to benefit from skilled PT services to address functional mobility goals and maximize independence.    Treatment Activities: Bed mobility, gait training    Educated the patient to role of physical therapy, plan of care, goals of therapy and safety with mobility and ADLs, spine precautions.    Plan:   PT Frequency: 3-4x/wk    Continue plan of care.    Unit: [facility] TOWER 8  Bed: F842/F842.01       Precautions and Contraindications:   Falls  Spinal, TLSO OOB    Subjective:    "How long do I have to walk with this thing?"  Patient's medical condition is appropriate for Physical Therapy intervention at this time.  Patient is agreeable to participation in the therapy session. Nursing clears patient for therapy.    Pain:   Scale: 5/10  Location: back  Intervention: medicated during session, mobility and repositioning      Objective:   Patient is in bed with dressings and peripheral IV in place.  Pt wore mask during therapy session:No      Cognition  Pt is alert and oriented x4 and follows directions without  difficulty.     Functional Mobility  Rolling: SBA  Supine to Sit: SBA  Scooting: nt  Sit to Stand: CGA  Stand to Sit: CGA  Transfers: nt    Ambulation  PMP - Progressive Mobility Protocol   PMP Activity: Step 7 - Walks out of Room  Distance Walked (ft) (Step 6,7): 100 Feet   Level of Assistance required: CGA  Pattern: decreased cadence, forward trunk flexion  Device Used: RW  Weightbearing Status: no restrictions  Stair Management: nt  Number of Stairs: nt      Balance  Static Sitting: good  Dynamic Sitting: good  Static Standing: good-  Dynamic Standing: good-    Therapeutic Exercises  N/a    Patient Participation: good  Patient Endurance: good-    Patient left with call bell within reach, all needs met, SCDs off, fall mat in place, bed alarm on, chair alarm n/a and all questions answered. RN notified of session outcome and patient response.     Goals:  Goals  Goal Formulation: With patient/family  Time for Goal Acheivement: 3 visits  Goals: Select goal  Pt Will Go Supine To Sit: with supervision  Pt Will Perform Sit To Supine: with minimal assist, Goal met  Pt Will Transfer Bed/Chair: with rolling walker, with supervision  Pt Will Ambulate: 51-100 feet, with rolling walker, with  supervision  Pt Will Go Up / Down Stairs: 1 flight, with contact guard assist, With rail      PPE worn during session: gloves    Tech present: no  PPE worn by tech: N/A      Time of Treatment:  PT Received On: 01/21/23  Start Time: 0945  Stop Time: 1015  Time Calculation (min): 30 min  Treatment # 2 out of 3 visits    Donney Dice, [place]  Pager [phone]

## 2023-01-21 NOTE — Plan of Care (Addendum)
[name] is a 62 y.o. female who presents to the hospital on 01/13/2023       Procedure(s):  Temporary IVC FILTER PLACEMENT    5 Days Post-Op  -------------------   A/ox3. Forgetful. Vs stable. Pain is controlled with oxycodone. Pt walked to bathroom and voided well without problem. LM:7/30. Pt took off metplix which nurse applied to sacral area. She tried to pull out iv access. Nurse reminded her not to pull it out. No numbness and tingling. Given crushed med with apple sauce. Husband is at bedside. Turn every 2 hrs as needed. Hourly rounding. Call bell in reach. Floor mat in place. Bed alarm on.         Most Recent Set of Vitals   Visit Vitals  BP 117/74   Pulse 72   Temp 97.4 F (36.3 C) (Axillary)   Resp 16   Ht 1.575 m (5' 2.01")   Wt 52 kg (114 lb 10.2 oz)   SpO2 100%   BMI 20.96 kg/m       Intake and Output Summary (Last 24 hours) at Date Time  No intake or output data in the 24 hours ending 01/21/23 1416    Patient Lines/Drains/Airways Status       Active Lines, Drains and Airways       Name Placement date Placement time Site Days    Peripheral IV 01/20/23 20 G Left Antecubital 01/20/23  0230  Antecubital  1    Venous Sheath 8 Fr. Right Femoral 01/16/23  1357  Femoral  5                   Problem: Moderate/High Fall Risk Score >5  Goal: Patient will remain free of falls  Outcome: Progressing     Problem: Pain interferes with ability to perform ADL  Goal: Pain at adequate level as identified by patient  Outcome: Progressing     Problem: Side Effects from Pain Analgesia  Goal: Patient will experience minimal side effects of analgesic therapy  Outcome: Progressing     Problem: Impaired Mobility  Goal: Mobility/Activity is maintained at optimal level for patient  Outcome: Progressing     Problem: Peripheral Neurovascular Impairment  Goal: Extremity color, movement, sensation are maintained or improved  Outcome: Progressing     Problem: Compromised skin integrity  Goal: Skin integrity is maintained or  improved  Outcome: Progressing     Problem: Nutrition  Goal: Nutritional intake is adequate  Outcome: Progressing     Problem: Neurological Deficit  Goal: Neurological status is stable or improving  Outcome: Progressing     Problem: Potential for Aspiration  Goal: Risk of aspiration will be minimized  Outcome: Progressing     Problem: Communication Impairment  Goal: Will be able to express needs and understand communication  Outcome: Progressing     Problem: Compromised Sensory Perception  Goal: Sensory Perception Interventions  Outcome: Progressing     Problem: Compromised Moisture  Goal: Moisture level Interventions  Outcome: Progressing     Problem: Compromised Activity/Mobility  Goal: Activity/Mobility Interventions  Outcome: Progressing     Problem: Compromised Nutrition  Goal: Nutrition Interventions  Outcome: Progressing     Problem: Compromised Friction/Shear  Goal: Friction and Shear Interventions  Outcome: Progressing     Problem: Safety  Goal: Patient will be free from injury during hospitalization  Outcome: Progressing  Goal: Patient will be free from infection during hospitalization  Outcome: Progressing     Problem: Pain  Goal:  Pain at adequate level as identified by patient  Outcome: Progressing     Problem: Discharge Barriers  Goal: Patient will be discharged home or other facility with appropriate resources  Outcome: Progressing     Problem: Psychosocial and Spiritual Needs  Goal: Demonstrates ability to cope with hospitalization/illness  Outcome: Progressing

## 2023-01-21 NOTE — Progress Notes (Signed)
[facility] HOSPITALIST PROGRESS NOTE    Date Time: 01/21/23 7:23 AM  Patient Name: [name]  Attending Physician: Viviann Spare, MD      Assessment   62 y.o. female hx DVT 2012, prior ulcerative colitis, HTN who presents with bilateral leg weakness, urinary incontinence likely secondary to 6 mm fluid collection at the T12 vertebrae extending to the mid inferior T11 and mid L1 indenting the anterior thecal sac likely from a small hematoma.      Plan   #Back pain from an acute T10 vertebral fracture with 2 mm retropulsion  #Bilateral leg weakness, urinary incontinence likely from focal fluid collection measuring 6 mm extending from the inferior T11 to mid L1 level causing indentation of the anterior thecal sac displacing the thecal sac likely from an epidural small hematoma  #Subacute T11 vertebral fracture, and subacute T12 vertebral fracture  #Old L1-L3 compression fractures  -Transfer patient to the neuro [facility]  -Neurochecks every 2 hours  -Fall, aspiration, delirium precautions  -Analgesia CK normal.   -CT lumbar spine suggested L3-L4 canal stenosis. It also mentioned T12 fracture new since 01/20/21 but likely chronic.   -MRI lumbar spine w/wo contrast reviewed.  -Neurosurgery was consulted for vertebral fractures and possible epidural small hematoma at the T12 level.  -No NSAIDs, anticoagulation, or antiplatelet therapy.  -TLSO back brace when OOB  -Upright x-rays of the thoracic spine reviewed.  Limited visualization.  Multilevel compression fractures.  Bones are relatively demineralized in appearance.  - Has poor dentition, want dental clearance prior to starting on bone health therapy (bisphosphonates or Prolia as out patient).  -Patient's last dose of Xarelto was on 01/07/2023 or 01/08/2023.  -Appreciate neurosurgery input  -OT/PT recommends SNF placement       #Bilateral acute and subacute pulmonary emboli  # Acute bilateral LE DVT  #Hx DVT 2012, prior ulcerative colitis, HTN - Noted.   -Patient was cleared  to restart Xarelto 10 mg p.o. nightly on 01/19/2023 by neurosurgery.  - reviewed Venous Doppler ultrasound of bilateral lower extremities  - s/p temporary IVC filter on 7/28 -> needs to come out in 03/2023 with IR team  - -Hematology recommends bridging with heparin gtt to eliquis upon her discharge--> started Eliquis since 7/31--> can be discharged on 8/01 if stable.    - discontinued heparin drip on 7/31  Patient is now started on Eliquis, monitor closely  - has been on RA, is not in distress  -Hematology input appreciated  - should follow up with Dr. Daisey Must who has been managing her [facility] previously   Currently Foley catheter removed     #Unintentional weight loss   #former smoker  -CT CAP reviewed  1.No evidence of metastatic disease in the chest, abdomen or pelvis.  2.Bilateral acute/subacute pulmonary emboli.  3.Redemonstration of deep venous thrombosis in the left superficial and  left common femoral veins. Mixing artifact versus thrombus in the right  common femoral and right external iliac veins.  4.Trace bilateral pleural effusions, left greater than right.  5.Thoracolumbar vertebral body fractures better assessed on the prior  lumbar spine MRI.  -Mirtazapine 7.5 mg p.o. nightly    DVT prophy: SCDs.  Eliquis  Diet: regular  Code status: full  Status: inpatient                       Recent Labs   Lab 01/21/23  0213 01/20/23  1647 01/20/23  0447   Hemoglobin 10.4* 11.0* 10.3*  Hematocrit 31.6* 33.7* 31.4*   MCV 103.6* 105.0* 104.0*   WBC 6.04 6.63 4.98   Platelet Count 233 221 223         Anemia Diagnosis: Acute: Acute Anemia, Unspecified  Malnutrition Documentation    Severe malnutrition related to inadequate protein energy intake in the setting of acute illness as evidenced by  intake < 50% of estimated energy requirements for > 5 days, >7.5% weight loss x 3 months, moderate muscle depletion (temporalis, pectoralis major, gastrocnemius).            History of Presenting Illness and Interval History/24  hour Events:   CC: bilateral leg weakness, urinary incontinence     HPI per Admitting Provider  "  [name] is a 62 y.o. female hx DVT 2012, prior ulcerative colitis, HTN who presents with bilateral leg weakness, urinary incontinence. This has been for 3 days. She has difficulty making to the bathroom so had urinary incontinence. She also notes low back pain. She is supposed to be on Xarelto but has not taken it for 2 days as she ran out. These symptoms are sudden onset, moderate intensity, without alleviating factors.  "    7/25: Patient reports that she fell into the wall approximately week ago.  She ran out of Xarelto anticoagulation medications on Thursday or Friday of last week.  She has not taken Xarelto for at least 6 days.  Patient does have intermittent low back pain.    7/26: Patient reports that her back pain is currently controlled.  Patient does have weakness of her bilateral lower extremity.  Her urinary incontinence has resolved.    7/27: Pt has no new complaints.  Her husband reports that she is non-compliant with medical follow up since the COVID pandemic.  Pt has had 15 lbs weight loss in 3-4 months.  She quit smoking 15 yrs ago when she was diagnosed with a DVT.    7/28: Pt has intermittent back pain.  Pt agrees that she has lost weight.       7/29: Patient still has a poor appetite.  She does not have insomnia.  Patient does have intermittent low back pain.  She denies any chest pain, shortness of breath, nausea, vomiting, or bowel changes.  7/30. On RA, more comfortable, no any acute events noted  7/31. No any acute events noted, on RA, awake and alert.   8/1 patient is doing fine, pain seem to be well-controlled today  Physical Exam:     Vitals:    01/21/23 0215   BP: 130/87   Pulse: 74   Resp: 18   Temp: 97.3 F (36.3 C)   SpO2: 100%       Intake and Output Summary (Last 24 hours) at Date Time  No intake or output data in the 24 hours ending 01/21/23 0723        General: awake,  alert, oriented x 3; no acute distress.  HEENT: Normocephalic, atraumatic. MMM  Neck: supple  Cardiovascular: regular rate and rhythm, no murmurs, rubs or gallops  Lungs: clear to auscultation bilaterally, without wheezing, rhonchi, or rales  Abdomen: soft, non-tender, non-distended; no palpable masses, no hepatosplenomegaly, normoactive bowel sounds, no rebound or guarding  Extremities: no clubbing, cyanosis, or edema  Neuro: cranial nerves grossly intact, strength 5/5 in upper and 3/5 hip flexion and knee extension and 4+/5 dorsiflexion and plantarflexion, sensation intact, knee DTRs 2/4, follows commands  Skin: no rashes or lesions noted  Medications:     Current Facility-Administered Medications   Medication Dose Route Frequency    apixaban  5 mg Oral Q12H SCH    lidocaine  1 patch Transdermal Q24H    methocarbamol  500 mg Oral QID    metoprolol tartrate  50 mg Oral Q12H SCH    mirtazapine  7.5 mg Oral QHS    multivitamin  1 tablet Oral Daily    pantoprazole  40 mg Oral Daily    timolol  1 drop Left Eye Daily    vitamin D (ergocalciferol)  50,000 Units Oral Weekly         Labs:     Results       Procedure Component Value Units Date/Time    CBC without Differential [[id number]]  (Abnormal) Collected: 01/21/23 0213    Specimen: Blood, Venous Updated: 01/21/23 0345     WBC 6.04 x10 3/uL      Hemoglobin 10.4 g/dL      Hematocrit 86.5 %      Platelet Count 233 x10 3/uL      MPV 10.6 fL      RBC 3.05 x10 6/uL      MCV 103.6 fL      MCH 34.1 pg      MCHC 32.9 g/dL      RDW 15 %      nRBC % 0.0 /100 WBC      Absolute nRBC 0.00 x10 3/uL     CBC without Differential [[id number]]  (Abnormal) Collected: 01/20/23 1647    Specimen: Blood, Venous Updated: 01/20/23 2002     WBC 6.63 x10 3/uL      Hemoglobin 11.0 g/dL      Hematocrit 28.4 %      Platelet Count 221 x10 3/uL      MPV 11.2 fL      RBC 3.21 x10 6/uL      MCV 105.0 fL      MCH 34.3 pg      MCHC 32.6 g/dL      RDW 15 %      nRBC % 0.0 /100 WBC      Absolute nRBC  0.00 x10 3/uL     Anti-Xa, UFH [[id number]]  (Normal) Collected: 01/20/23 1219    Specimen: Blood, Venous Updated: 01/20/23 1256     Anti-Xa, UFH 0.48 IU/mL     CBC without Differential [[id number]]  (Abnormal) Collected: 01/20/23 0447    Specimen: Blood, Venous Updated: 01/20/23 0732     WBC 4.98 x10 3/uL      Hemoglobin 10.3 g/dL      Hematocrit 34.7 %      Platelet Count 223 x10 3/uL      MPV 10.4 fL      RBC 3.02 x10 6/uL      MCV 104.0 fL      MCH 34.1 pg      MCHC 32.8 g/dL      RDW 15 %      nRBC % 0.0 /100 WBC      Absolute nRBC 0.00 x10 3/uL               Radiology:     Radiology Results (24 Hour)       ** No results found for the last 24 hours. **            Lines  Patient Lines/Drains/Airways Status       Active PICC Line / CVC Line / PIV Line /  Drain / Airway / Intraosseous Line / Epidural Line / ART Line / Line / Wound / Pressure Ulcer / NG/OG Tube       Name Placement date Placement time Site Days    Peripheral IV 01/13/23 18 G Standard Left Antecubital 01/13/23  1903  Antecubital  less than 1    Wound 05/25/19 Surgical Incision Eye Left eye pads, paper tape 05/25/19  1116  Eye  1329                           Signed by: Viviann Spare, MD, MD,  Cc    I have spent 50 minutes with the patient on counseling, chart review, and coordinating care

## 2023-01-21 NOTE — Nursing Progress Note (Signed)
Procedure(s):  Temporary IVC FILTER PLACEMENT    5 Days Post-Op  -------------------         Pt A&O X4. VSS.  Tolerated whole pills with thin liquids.  Pt able to ambulate to bathroom with walker    Encouraged IS use.   Continent of bowel and bladder. LBM 01/19/23. Pt voiding via toilet  Pain Controlled with Oxy 5mg   Good pedal pulses bilaterally  Call bell and bedside table within reach at all times.   Notable Shift Events  Pt is forgetful and non-compliant with safety protocol, Pt continues to remove foam dressing to sacrum and buttocks    Vladimir Creeks, RN  01/21/23  5:49 AM

## 2023-01-21 NOTE — Plan of Care (Signed)
Problem: Moderate/High Fall Risk Score >5  Goal: Patient will remain free of falls  Outcome: Progressing     Problem: Pain interferes with ability to perform ADL  Goal: Pain at adequate level as identified by patient  Outcome: Progressing     Problem: Side Effects from Pain Analgesia  Goal: Patient will experience minimal side effects of analgesic therapy  Outcome: Progressing     Problem: Impaired Mobility  Goal: Mobility/Activity is maintained at optimal level for patient  Outcome: Progressing     Problem: Peripheral Neurovascular Impairment  Goal: Extremity color, movement, sensation are maintained or improved  Outcome: Progressing     Problem: Compromised skin integrity  Goal: Skin integrity is maintained or improved  Outcome: Progressing     Problem: Nutrition  Goal: Nutritional intake is adequate  Outcome: Progressing     Problem: Neurological Deficit  Goal: Neurological status is stable or improving  Outcome: Progressing     Problem: Potential for Aspiration  Goal: Risk of aspiration will be minimized  Outcome: Progressing     Problem: Communication Impairment  Goal: Will be able to express needs and understand communication  Outcome: Progressing     Problem: Compromised Sensory Perception  Goal: Sensory Perception Interventions  Outcome: Progressing     Problem: Compromised Moisture  Goal: Moisture level Interventions  Outcome: Progressing     Problem: Compromised Activity/Mobility  Goal: Activity/Mobility Interventions  Outcome: Progressing     Problem: Compromised Nutrition  Goal: Nutrition Interventions  Outcome: Progressing     Problem: Compromised Friction/Shear  Goal: Friction and Shear Interventions  Outcome: Progressing     Problem: Safety  Goal: Patient will be free from injury during hospitalization  Outcome: Progressing  Goal: Patient will be free from infection during hospitalization  Outcome: Progressing     Problem: Pain  Goal: Pain at adequate level as identified by patient  Outcome:  Progressing     Problem: Discharge Barriers  Goal: Patient will be discharged home or other facility with appropriate resources  Outcome: Progressing     Problem: Psychosocial and Spiritual Needs  Goal: Demonstrates ability to cope with hospitalization/illness  Outcome: Progressing

## 2023-01-22 DIAGNOSIS — E43 Unspecified severe protein-calorie malnutrition: Secondary | ICD-10-CM

## 2023-01-22 DIAGNOSIS — L8993 Pressure ulcer of unspecified site, stage 3: Secondary | ICD-10-CM

## 2023-01-22 LAB — BASIC METABOLIC PANEL
Anion Gap: 11 (ref 5.0–15.0)
BUN: 7 mg/dL (ref 7–21)
CO2: 19 mEq/L (ref 17–29)
Calcium: 8.3 mg/dL — ABNORMAL LOW (ref 8.5–10.5)
Chloride: 109 mEq/L (ref 99–111)
Creatinine: 0.6 mg/dL (ref 0.4–1.0)
GFR: >60 mL/min/{1.73_m2} (ref >=60.0)
Glucose: 80 mg/dL (ref 70–100)
Hemolysis Index: 69 Index
Sodium: 139 mEq/L (ref 135–145)

## 2023-01-22 LAB — CBC
Absolute nRBC: 0 10*3/uL (ref <=0.00)
Hematocrit: 32 % — ABNORMAL LOW (ref 34.7–43.7)
Hemoglobin: 10.7 g/dL — ABNORMAL LOW (ref 11.4–14.8)
MCH: 34.3 pg — ABNORMAL HIGH (ref 25.1–33.5)
MCHC: 33.4 g/dL (ref 31.5–35.8)
MCV: 102.6 fL — ABNORMAL HIGH (ref 78.0–96.0)
MPV: 10.7 fL (ref 8.9–12.5)
Platelet Count: 240 10*3/uL (ref 142–346)
RBC: 3.12 10*6/uL — ABNORMAL LOW (ref 3.90–5.10)
RDW: 15 % (ref 11–15)
WBC: 5.69 10*3/uL (ref 3.10–9.50)
nRBC %: 0 /100 WBC (ref <=0.0)

## 2023-01-22 MED ORDER — TAB-A-VITE/BETA CAROTENE PO TABS
1.0000 | ORAL_TABLET | Freq: Every day | ORAL | Status: DC
Start: 2023-01-23 — End: 2023-05-17

## 2023-01-22 MED ORDER — OXYCODONE HCL 5 MG PO TABS
5.0000 mg | ORAL_TABLET | Freq: Four times a day (QID) | ORAL | 0 refills | Status: AC | PRN
Start: 2023-01-22 — End: 2023-01-29

## 2023-01-22 MED ORDER — ACETAMINOPHEN 325 MG PO TABS
650.0000 mg | ORAL_TABLET | ORAL | Status: AC | PRN
Start: 2023-01-22 — End: ?

## 2023-01-22 MED ORDER — SENNOSIDES-DOCUSATE SODIUM 8.6-50 MG PO TABS
2.0000 | ORAL_TABLET | Freq: Two times a day (BID) | ORAL | Status: DC | PRN
Start: 2023-01-22 — End: 2023-05-17

## 2023-01-22 MED ORDER — LIDOCAINE 5 % EX PTCH
1.0000 | MEDICATED_PATCH | CUTANEOUS | Status: DC
Start: 2023-01-22 — End: 2023-05-17

## 2023-01-22 MED ORDER — METHOCARBAMOL 500 MG PO TABS
500.0000 mg | ORAL_TABLET | Freq: Four times a day (QID) | ORAL | Status: DC
Start: 2023-01-22 — End: 2023-05-17

## 2023-01-22 NOTE — Plan of Care (Signed)
Problem: Moderate/High Fall Risk Score >5  Goal: Patient will remain free of falls  Outcome: Progressing     Problem: Pain interferes with ability to perform ADL  Goal: Pain at adequate level as identified by patient  Outcome: Progressing     Problem: Side Effects from Pain Analgesia  Goal: Patient will experience minimal side effects of analgesic therapy  Outcome: Progressing     Problem: Impaired Mobility  Goal: Mobility/Activity is maintained at optimal level for patient  Outcome: Progressing     Problem: Peripheral Neurovascular Impairment  Goal: Extremity color, movement, sensation are maintained or improved  Outcome: Progressing     Problem: Compromised skin integrity  Goal: Skin integrity is maintained or improved  Outcome: Progressing     Problem: Nutrition  Goal: Nutritional intake is adequate  Outcome: Progressing     Problem: Neurological Deficit  Goal: Neurological status is stable or improving  Outcome: Progressing     Problem: Potential for Aspiration  Goal: Risk of aspiration will be minimized  Outcome: Progressing     Problem: Communication Impairment  Goal: Will be able to express needs and understand communication  Outcome: Progressing     Problem: Compromised Sensory Perception  Goal: Sensory Perception Interventions  Outcome: Progressing     Problem: Compromised Moisture  Goal: Moisture level Interventions  Outcome: Progressing     Problem: Compromised Activity/Mobility  Goal: Activity/Mobility Interventions  Outcome: Progressing     Problem: Compromised Nutrition  Goal: Nutrition Interventions  Outcome: Progressing     Problem: Compromised Friction/Shear  Goal: Friction and Shear Interventions  Outcome: Progressing     Problem: Safety  Goal: Patient will be free from injury during hospitalization  Outcome: Progressing  Goal: Patient will be free from infection during hospitalization  Outcome: Progressing     Problem: Pain  Goal: Pain at adequate level as identified by patient  Outcome:  Progressing     Problem: Discharge Barriers  Goal: Patient will be discharged home or other facility with appropriate resources  Outcome: Progressing     Problem: Psychosocial and Spiritual Needs  Goal: Demonstrates ability to cope with hospitalization/illness  Outcome: Progressing

## 2023-01-22 NOTE — OT Progress Note (Signed)
Occupational Therapy Treatment  [name]        Post Acute Care Therapy Recommendations:     Discharge Recommendations:  SNF    If SNF  recommended discharge disposition is not available, patient will need hands on assist for ADLs and functional mobility, HHOT, HHA and transport home.     DME needs IF patient is discharging home: Front wheel walker, BSC, Long-handled sponge, Shower chair    Therapy discharge recommendations may change with patient status.  Please refer to most recent note for up-to-date recommendations.    Assessment:   Significant Findings: None    Pt greeted in bed and agreeable to OT session with encouragement. Bed mobility performed with SBA grossly with cues for logroll. Mod A provided for donning TLSO. Pt reluctant to put on TLSO 2/2 feeling it is uncomfortable. Functional ambulation demonstrated with CGA-pt has tendency to have walker too far in front of her despite cues. Toileting tasks completed with CGA. Pt declined sitting up in chair.Mod A to doff TLSO at EOB. Pt positioned to comfort in bed, all needs met. Continued skilled OT recommended in order to maximize safety and independence with ADL performance and functional tasks of choice.     Treatment Activities: ADL retraining, spinal precautions,compensatory strategies    Educated the patient to role of occupational therapy, plan of care, goals of therapy and safety with mobility and ADLs, energy conservation techniques, spine precautions.    Plan:    OT Frequency Recommended: 3-4x/wk     Continue plan of care.    Unit: [facility] TOWER 8  Bed: Z610/R604.54       Precautions and Contraindications:   Falls  Spinal precautions  TLSO OOB    Updated Medical Status/Imaging/Labs:  Reviewed    Subjective: "Can I take this thing off?"   Patient's medical condition is appropriate for Occupational Therapy intervention at this time.  Patient is agreeable to participation in the therapy session. Nursing clears patient for  therapy.    Pain:   Scale: 6/10  Location: Low back  Intervention: Positioned for comfort, rest    Objective:   Patient is in bed with PIV , fall mat, and bed alarm  in place.  Pt wore mask during therapy session:No      Vital Signs:  -Stable throughout    Cognition/Neurological:  A&O x4. Follows commands with encouragement.     Functional Mobility:  Rolling: SBA    Supine to Sit: SBA cues for maintaining spinal precautions  Sit to Supine: CGA    Transfers:  Sit to Stand: CGA with FWW from higher surface, Min A from lower surface  Stand to Sit: CGA    PMP Activity: Step 7 - Walks out of Room    Balance:  Static Sitting: Good  Dynamic Sitting: Good  Static Standing: Fair  Dynamic Standing: Fair    Self Care and Home Management:  Eating: Set up A  Grooming: SBA  UE Dressing: Mod A for TLSO  Toileting: CGA    Therapeutic Exercises:  Incorporated throughout session    Participation: Good  Endurance: Fair    Patient left with call bell within reach, all needs met, SCDs off-as found, fall mat in place, bed alarm engaged, chair alarm N/A and all questions answered. RN notified of session outcome and patient response.     Goals:  Time For Goal Achievement: 10 visits  ADL Goals  Patient will groom self: Supervision, at sinkside  Patient  will dress lower body: Stand by Assist, with AE  Patient will toilet: Stand by Assist  Mobility and Transfer Goals  Pt will perform functional transfers: Stand by Assist, with rolling walker  Neuro Re-Ed Goals  Pt will perform dynamic standing balance: for 10 minutes, Stand by Assist     Executive Fucntion Goals  Pt will follow spine precautions: Recall 3/3, to increase ability to complete ADLs, with supervision     PPE worn during session: gloves    Tech present: N/A  PPE worn by tech: N/A    Sissy Hoff, COTA/L  Pager [phone]    Time of Treatment  OT Received On: 01/22/23  Start Time: 1145  Stop Time: 1215  Time Calculation (min): 30 min  Treatment # 5 of 10 visits

## 2023-01-22 NOTE — Progress Notes (Addendum)
01/22/23 1450   Discharge Disposition   Patient preference/choice provided? Yes   Physical Discharge Disposition SNF   Was patient sent with auth pending to the post-acute facility? Yes   Was pending auth transfer approved by CM lead, Special educational needs teacher? Yes  Carolynn Serve RN CM Case Manager)   Receiving facility, unit and room number: [facility]/room 206B   Nursing report phone number: [phone]   Mode of Transportation Other (comment)  (Stretcher)   Pick up time 4:30PM to be confirmed   Patient/Family/POA notified of transfer plan Yes   Patient agreeable to discharge plan/expected d/c date? Yes   Family/POA agreeable to discharge plan/expected d/c date? Yes   Bedside nurse notified of transport plan? Yes   CM Interventions   Follow up appointment scheduled? No   Reason no follow up scheduled? Discharge to SNF/AR/LTAC   Notified MD? No   Multidisciplinary rounds/family meeting before d/c? Yes   Medicare Checklist   Is this a Medicare patient? No   Medicaid/DMAS   Medicaid Pre-Screening completed Yes   DMAS 95 MI/MR completed and faxed Yes   DMAS 95 MI/MR trigger Level 2 Screening? No     Evalee Jefferson, RN  Care Management  RN Care Manager  [facility]    ( Going to facility [facility].)    Daryll Drown time confirmed for 1800 PM.

## 2023-01-22 NOTE — Progress Notes (Signed)
Quick Doc  [facility] TOWER 8   Patient Name: [name], [name]   Attending Physician: Viviann Spare, MD   Today's date:   01/22/2023 LOS: 6 days   Expected Discharge Date      Quick  Assessment:                                                              ReAdmit Risk Score: 10.24    CM Comments: 07/31- Spinal stenosis of lumbosacral region; DISPO; SNF referrals updated in careport           This writer spoke with the patient and spouse at bedside, they provided SNF choice as follows    [name] liaison notified and waiting on a response, patient has active discharge order.         Carolynn Serve RN,MSN, CCM-RN  Case Management Manager   Case Management Department  [facility]  [address]   [place], [place] [address]  P: [phone]  F: [phone]                                                                             Provider Notifications:

## 2023-01-22 NOTE — Plan of Care (Addendum)
Pt will Cutter via H and M. Iv removed. Pt forgetful. All belongs packed.   Problem: Moderate/High Fall Risk Score >5  Goal: Patient will remain free of falls  Outcome: Completed     Problem: Pain interferes with ability to perform ADL  Goal: Pain at adequate level as identified by patient  Outcome: Completed     Problem: Side Effects from Pain Analgesia  Goal: Patient will experience minimal side effects of analgesic therapy  Outcome: Completed     Problem: Impaired Mobility  Goal: Mobility/Activity is maintained at optimal level for patient  Outcome: Completed     Problem: Peripheral Neurovascular Impairment  Goal: Extremity color, movement, sensation are maintained or improved  Outcome: Completed     Problem: Compromised skin integrity  Goal: Skin integrity is maintained or improved  Outcome: Completed     Problem: Nutrition  Goal: Nutritional intake is adequate  Outcome: Completed     Problem: Neurological Deficit  Goal: Neurological status is stable or improving  Outcome: Completed     Problem: Potential for Aspiration  Goal: Risk of aspiration will be minimized  Outcome: Completed     Problem: Communication Impairment  Goal: Will be able to express needs and understand communication  Outcome: Completed     Problem: Compromised Sensory Perception  Goal: Sensory Perception Interventions  Outcome: Completed     Problem: Compromised Moisture  Goal: Moisture level Interventions  Outcome: Completed     Problem: Compromised Activity/Mobility  Goal: Activity/Mobility Interventions  Outcome: Completed     Problem: Compromised Nutrition  Goal: Nutrition Interventions  Outcome: Completed     Problem: Compromised Friction/Shear  Goal: Friction and Shear Interventions  Outcome: Completed     Problem: Safety  Goal: Patient will be free from injury during hospitalization  Outcome: Completed  Goal: Patient will be free from infection during hospitalization  Outcome: Completed     Problem: Pain  Goal: Pain at adequate level as  identified by patient  Outcome: Completed     Problem: Discharge Barriers  Goal: Patient will be discharged home or other facility with appropriate resources  Outcome: Completed     Problem: Psychosocial and Spiritual Needs  Goal: Demonstrates ability to cope with hospitalization/illness  Outcome: Completed

## 2023-01-22 NOTE — Progress Notes (Signed)
Patient [name] transport has been scheduled for 6:00pm today through [facility] transport [phone].    01/22/23 1510   CMA Tasks   CMA tasks Transport arranged

## 2023-01-22 NOTE — Nursing Progress Note (Signed)
Procedure(s):  Temporary IVC FILTER PLACEMENT    6 Days Post-Op  -------------------         Pt A&O X4 but forgetful. VSS.  Tolerated whole pills with thin liquids.  Pt able to ambulate to bathroom with walker    Encouraged IS use.   Continent of bowel and bladder. LBM 01/21/23. Pt voiding via toilet  Pain Controlled with oxycodone 5mg   Good pedal pulses bilaterally  Call bell and bedside table within reach at all times.   Notable Shift Events  Pt non-compliant with safety precautions    Vladimir Creeks, RN  01/22/23  4:47 AM

## 2023-01-22 NOTE — Discharge Summary (Signed)
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        Discharge Medications:     Medication List        START taking these medications      acetaminophen 325 MG tablet  Commonly known as: Tylenol  Take 2 tablets (650 mg) by mouth every 4 (four) hours as needed for Pain     lidocaine 5 %  Commonly known as: LIDODERM  Place 1 patch onto the skin every 24 hours Remove & Discard patch within 12 hours or as directed by MD     methocarbamol 500 MG tablet  Commonly known as: ROBAXIN  Take 1 tablet (500 mg) by mouth 4 (four) times daily     multivitamin Tabs  Take 1 tablet by mouth daily  Start taking on: January 23, 2023     oxyCODONE 5 MG immediate release tablet  Commonly known as: ROXICODONE  Take 1 tablet (5 mg) by mouth every 6 (six) hours as needed for Pain     senna-docusate 8.6-50 MG per tablet  Commonly known as: PERICOLACE  Take 2 tablets by mouth 2 (two) times daily as needed for Constipation            CONTINUE taking these medications      desloratadine 5 MG tablet  Commonly known as: CLARINEX     Dexilant 60 MG XR capsule  Generic drug: dexlansoprazole     ergocalciferol 1.25 MG (50000 UT) capsule  Commonly known as: ERGOCALCIFEROL     hydrocortisone 2.5 % cream     metoprolol tartrate 100 MG tablet  Commonly known as: LOPRESSOR     rivaroxaban 10 MG Tabs  Commonly known as: XARELTO     timolol 0.5 % ophthalmic solution  Commonly known as: TIMOPTIC     Yuvafem 10  MCG Tabs  Generic drug: estradiol            STOP taking these medications      acetaminophen-codeine 300-30 MG per tablet  Commonly known as: TYLENOL #3               Where to Get Your Medications        You can get these medications from any pharmacy    Bring a paper prescription for each of these medications  oxyCODONE 5 MG immediate release tablet       Information about where to get these medications is not yet available    Ask your nurse or doctor about these medications  acetaminophen 325 MG tablet  lidocaine 5 %  methocarbamol 500 MG tablet  multivitamin Tabs  senna-docusate 8.6-50 MG per tablet             Hospital Course   Presentation History   Per admitting H&P " [name] is a 62  y.o. female hx DVT 2012, prior ulcerative colitis, HTN who presents with bilateral leg weakness, urinary incontinence. This has been for 3 days. She has difficulty making to the bathroom so had urinary incontinence. She also notes low back pain. She is supposed to be on Xarelto but has not taken it for 2 days as she ran out. These symptoms are sudden onset, moderate intensity, without alleviating factors.  "     See HPI for details.    Hospital Course   Following admission, patient was seen by neurosurgery consult who recommended pain medications, getting MRI of the spine, MRI of the lumbar spine showed T10 vertebral body superior endplate incomplete burst fracture with 2 mm retropulsion, there was marrow edema consistent with recent likely acute infarction, focal abnormal fluid collection measuring 6 cm centered at T2 vertebral extending from mid inferior T11 representing a small hematoma, T1 vertebral bodies and acute compression fracture.      Present on admission was also deep tissue injury in the sacral area from being in bed which was seen by wound care team, patient had evaluation by hematology team for need of anticoagulation due to her recent DVT and being on Xarelto, repeat imaging of lower extremities showed  nonocclusive thrombus within the right popliteal vein, also thrombus within the left common femoral, greater saphenous, femoral, popliteal, gastroc send MS veins noted, patient was taken off anticoagulant IVC filter was placed on 01/16/2023.     Patient meanwhile was continued on pain medications, was temporarily placed on heparin drip, patient had TLSO brace placed, meanwhile patient was seen by PT and OT team who recommended going to skilled nursing facility.     In the hospital patient was also noted to have severe malnutrition with poor p.o. intake, low weight, muscle wasting.     Neurosurgery team while patient was getting prepped for surgery change their mind and recommended no surgical intervention at this time, recommended to wear TLSO brace when out of bed, recommended to hold off and on of the brace at the edge of bed, may shower without the brace, recommended going to skilled nursing facility, patient received some medication for Pain control, recommended follow-up 6 weeks with repeat upright x-ray brace.    Patient was eventually put back on Xarelto, discontinued heparin drip, remained stable, due to unintentional weight loss patient has CT of chest abdomen pelvis done which showed bilateral acute/subacute pulmonary emboli otherwise no fracture abnormality in the abdominal and chest organs noted.     Patient today is cleared for discharge to skilled nursing facility.  Pain has been well-controlled with medications.       Procedures:   CT Chest Abdomen Pelvis W Contrast   Final Result      1.No evidence of metastatic disease in the chest, abdomen or pelvis.   2.Bilateral acute/subacute pulmonary emboli.   3.Redemonstration of deep venous thrombosis in the left superficial and   left common femoral veins. Mixing artifact versus thrombus in the right   common femoral and right external iliac veins.   4.Trace bilateral pleural effusions, left greater than right.   5.Thoracolumbar vertebral body fractures  better assessed on the prior   lumbar spine MRI.      Carolyn Stare, MD   01/17/2023 7:32 PM      IVC Filter Placement   Final Result      Temporary IVC filter placement, bard Denali.      Darci Current, MD   01/17/2023 4:46 PM  US Aorta IVC Iliac Duplex Doppler Complete   Final Result       1. Nonocclusive thrombus within the right popliteal vein.   2. Thrombus within the left common femoral, greater saphenous, femoral,   popliteal, and gastrocnemius veins as described.   3. These urgent results were discussed with and acknowledged by Dr. Adin Hector on 01/15/2023 8:41 PM.      Gerlene Burdock, MD   01/15/2023 8:42 PM      US Venous Duplex Doppler Leg Bilateral   Final Result       1. Nonocclusive thrombus within the right popliteal vein.   2. Thrombus within the left common femoral, greater saphenous, femoral,   popliteal, and gastrocnemius veins as described.   3. These urgent results were discussed with and acknowledged by Dr. Adin Hector on 01/15/2023 8:41 PM.      Gerlene Burdock, MD   01/15/2023 8:42 PM      XR Thoracic Spine AP And Lateral   Final Result         Limited visualization. Multilevel compression fractures.      Melody Haver, MD   01/15/2023 10:59 AM      MRI Lumbar Spine W WO Contrast   Final Result      1.T10 vertebral body superior endplate incomplete burst fracture, with 2 mm   retropulsion, with marrow edema consistent with recent likely acute   fracture.   2.Focal abnormal fluid collection measuring 6 mm centered at T12 vertebrae   extending from mid inferior T11 to mid L1 level indenting the anterior   thecal sac displacing the thecal sac nonspecific but likely representing   small hematoma.    3.T11 vertebral body subacute compression fracture, unchanged in alignment.      4.T12 vertebral body superior endplate subacute compression fracture,   unchanged alignment.   5.Old superior endplate L1 L3 compression fractures, unchanged.       These urgent findings discussed with and acknowledged by Dr. Frederich Cha at    01/14/2023 9:47 AM.      Trinna Post Einar Pheasant, MD   01/14/2023 10:01 AM      CT Lumbar Spine without Contrast   Final Result       1.Compared to prior exam of 01/20/2021, there is new moderate compression   deformity of T12 vertebral body with associated bony fragmentation.   Although this is new from the prior exam of 2022, this is probably a   chronic fracture. Recommend correlation with history and examination.   2.Redemonstrated severe T11, moderate L1, L2, L3 compression deformities.   3.Disc bulge and ligamentum flavum thickening resulting in severe spinal   canal stenosis at L3-L4 level.   4.3-mm anterolisthesis of L3 on L4.   5.Diffuse bony demineralization.   6.Partially visualized left pleural effusion.      Vara Guardian, MD   01/13/2023 9:59 PM          Consultants  Treatment Team:   Viviann Spare, MD  Chitale, Ivar Bury, MD  Gittinger, Dan Humphreys, DO  You, Loren Racer, MD  Hulda Marin, LPTA  Weldegebrieal, Mebrahtu Samara Snide, RN  Frederica Kuster, RN      Best Practices   Was the patient admitted with either a CHF Exacerbation or Pneumonia? No     Progress Note/Physical Exam at Discharge     Subjective: Some back pain otherwise doing okay  Vitals:    01/21/23 1856 01/21/23 2138 01/22/23 0420 01/22/23 0735   BP: 123/74 129/74 143/88 129/90   Pulse: 89 83 81 87   Resp: 17  18 16    Temp: 97.5 F (36.4 C)  98.2 F (36.8 C) 97.9 F (36.6 C)   TempSrc: Oral  Oral Oral   SpO2: 99%  99% 99%   Weight:       Height:           General: NAD, AAOx3  HEENT: perrla, eomi, sclera anicteric, OP: Clear, MMM  Neck: supple, FROM, no LAD  Cardiovascular: RRR, no m/r/g  Lungs: CTAB, no w/r/r  Abdomen: soft, +BS, NT/ND, no masses, no g/r  Extremities: no C/C/E  Neuro: Power is 4/5 bilaterally  Skin: no rashes or lesions noted    Admission Condition: serious  Discharge Condition: good  Functional Status: Patient is not independent with mobility/ambulation, transfers,  ADL's, IADL's.     Diagnostics     Labs/Studies Pending at Discharge: No    Last Labs   Recent Labs   Lab 01/22/23  0434 01/21/23  0213 01/20/23  1647   WBC 5.69 6.04 6.63   RBC 3.12* 3.05* 3.21*   Hemoglobin 10.7* 10.4* 11.0*   Hematocrit 32.0* 31.6* 33.7*   MCV 102.6* 103.6* 105.0*   Platelet Count 240 233 221       Recent Labs   Lab 01/22/23  0434 01/20/23  0447 01/18/23  0438 01/16/23  0104   Sodium 139 138 137 137   Potassium  --  3.6 4.0 4.1   Chloride 109 109 109 109   CO2 19 20 17 19    BUN 7 4* 5* 8   Creatinine 0.6 0.7 0.7 0.6   Glucose 80 97 78 81   Calcium 8.3* 8.0* 8.1* 7.9*       Microbiology Results (last 15 days)       ** No results found for the last 360 hours. **             Patient Instructions   Discharge Diet: cardiac diet  Discharge Activity:  activity as tolerated  Discharge instructions:  Discharge Code Status: Full code  Additional instructions -     - No acute neurosurgical intervention indicated at this time.  - Wear TLSO brace when OOB              - May don/doff brace at edge of bed.              - May shower without brace  - Activity as tolerated. Avoid excessive bending, twisting, lifting >10lbs.  - Cleared to work with PT/OT  - No driving while taking narcotic medication.  - Follow-up with in 6 weeks with repeat upright XR in brace.     Disposition:  Home  Follow Up Appointment:   Follow-up Information       [facility]. Go on 04/19/2023.    Why: Ypu have a follow up appointment to discuss possible IVC filter removal with leg leg ultrasound prior. Your appointment is on 04/19/2023. Please arrive at 12:15pm.  Contact information:  [address]  [address]  [place] [address]  [phone]             Babette Relic, NP. Schedule an appointment as soon as possible for a visit in 6 week(s).    Specialty: Nurse Practitioner  Why: with repeat upright x rays  Contact information:  [employer]  [address]  [address] [place] [address]  [phone]               Daisey Must D, MD. Schedule an appointment as soon as possible for a visit in 2 week(s).    Specialty: Internal Medicine  Contact information:  [address]  [address] [place] [address]  [phone]                             See Daisey Must D, MD in 1-2 weeks.  See Dr. Chauncy Lean , Neurosurgery , in 4-6 weeks.          The review of the patient's medications does not in any way constitute an endorsement, by this clinician,  of their use, dosage, indications, route, efficacy, interactions, or other clinical parameters.     This note was generated within the EP[employer] EMR using Dragon medical speech recognition software and may contain inherent errors or omissions not intended by the user. Grammatical and punctuation errors, random word insertions, deletions, pronoun errors and incomplete sentences are occasional consequences of this technology due to software limitations. Not all errors are caught or corrected.  Although every attempt is made to root out erroneus and incomplete transcription, the note may still not fully represent the intent or opinion of the author. If there are questions or concerns about the content of this note or information contained within the body of this dictation they should be addressed directly with the author for clarification.    Time spent examining patient, discussing with patient/family regarding hospital course face to face, chart review, reconciling medications and discharge planning:  total time : 45 minutes.    Carmelina Noun, MD  11:27 AM 01/22/2023     CC to: Staci Righter, MD

## 2023-01-22 NOTE — PT Progress Note (Signed)
Physical Therapy Note     Physical Therapy Treatment  [name]  Post Acute Care Therapy Recommendations:     Discharge Recommendations:  SNF    If SNF  recommended discharge disposition is not available, patient will need hands on assist for functional mobility, transport into home and HHPT.     DME needs IF patient is discharging home:  (junior RW)    Therapy discharge recommendations may change with patient status.  Please refer to most recent note for up-to-date recommendations.    Assessment:   Significant Findings: none    Pt received in bed, agreeable to therapy. Pt limited by back pain but agreeable to OOB mobility. Able to complete all bed mobility with SBA and requires total A to don TLSO. Tolerated ambulation in hallway with CGA-SBA using RW with cueing for proximity to RW and upright posture - reports some relief of back pain. Returned to bed at end of session. Pt would continue to benefit from skilled PT services to address functional mobility goals and maximize independence.    Treatment Activities: Bed mobility, gait training    Educated the patient to role of physical therapy, plan of care, goals of therapy and safety with mobility and ADLs, spine precautions.    Plan:   PT Frequency: 3-4x/wk    Continue plan of care.    Unit: [facility] TOWER 8  Bed: F848/F848.01       Precautions and Contraindications:   Falls  Spinal, TLSO OOB    Subjective:    "Do we have to right now?"  Patient's medical condition is appropriate for Physical Therapy intervention at this time.  Patient is agreeable to participation in the therapy session. Nursing clears patient for therapy.    Pain:   Scale: 5/10  Location: back  Intervention: medicated during session, mobility and repositioning      Objective:   Patient is in bed with dressings and peripheral IV in place.  Pt wore mask during therapy session:No      Cognition  Pt is alert and oriented x4 and follows directions without difficulty.      Functional Mobility  Rolling: SBA  Supine to Sit: SBA  Scooting: nt  Sit to Stand: CGA  Stand to Sit: CGA  Transfers: nt    Ambulation  PMP - Progressive Mobility Protocol   PMP Activity: Step 7 - Walks out of Room  Distance Walked (ft) (Step 6,7): 200 Feet   Level of Assistance required: CGA-SBA  Pattern: decreased cadence, forward trunk flexion  Device Used: RW  Weightbearing Status: no restrictions  Stair Management: nt  Number of Stairs: nt      Balance  Static Sitting: good  Dynamic Sitting: good  Static Standing: good-  Dynamic Standing: good-    Therapeutic Exercises  N/a    Patient Participation: good  Patient Endurance: good-    Patient left with call bell within reach, all needs met, SCDs off, fall mat in place, bed alarm on, chair alarm n/a and all questions answered. RN notified of session outcome and patient response.     Goals:  Goals  Goal Formulation: With patient/family  Time for Goal Acheivement: 3 visits  Goals: Select goal  Pt Will Go Supine To Sit: with supervision  Pt Will Perform Sit To Supine: with minimal assist, Goal met  Pt Will Transfer Bed/Chair: with rolling walker, with supervision  Pt Will Ambulate: 51-100 feet, with rolling walker, with supervision  Pt Will  Go Up / Down Stairs: 1 flight, with contact guard assist, With rail      PPE worn during session: gloves    Tech present: no  PPE worn by tech: N/A      Time of Treatment:  PT Received On: 01/22/23  Start Time: 1130  Stop Time: 1200  Time Calculation (min): 30 min  Treatment # 3 out of 3 visits    Donney Dice, [place]  Pager [phone]

## 2023-01-25 ENCOUNTER — Telehealth: Payer: Self-pay | Admitting: Nurse Practitioner

## 2023-01-25 ENCOUNTER — Other Ambulatory Visit: Payer: Self-pay | Admitting: Nurse Practitioner

## 2023-01-25 DIAGNOSIS — S22000D Wedge compression fracture of unspecified thoracic vertebra, subsequent encounter for fracture with routine healing: Secondary | ICD-10-CM

## 2023-01-25 DIAGNOSIS — S22000A Wedge compression fracture of unspecified thoracic vertebra, initial encounter for closed fracture: Secondary | ICD-10-CM | POA: Insufficient documentation

## 2023-01-25 NOTE — Telephone Encounter (Signed)
Called patient and left patient a voicemail to schedule a 6 week follow up appointment with Cristie.

## 2023-02-23 ENCOUNTER — Telehealth: Payer: Self-pay | Admitting: Nurse Practitioner

## 2023-02-23 NOTE — Telephone Encounter (Signed)
Patients husband called to make an appt with Cristie. I put him on hold to get time frames and when I got back on no one was there. I called patient back and left a detailed message that appt can be made next week or after and x-ray orders are in the system and are walk in basis 1-3 days prior to appt. If they call back an appt just needs to be made. Thank you

## 2023-03-18 ENCOUNTER — Telehealth: Payer: Self-pay

## 2023-03-18 NOTE — Telephone Encounter (Signed)
Contacted Pecolia Raitz to confirm 03/19/23 1:00 PM appointment with Tawni Millers, NP. Patient confirmed appt. over the phone. XR Thoracolumbar will be obtained same day of appointment. Requested a call back if appointment reschedule is needed.     3:53 PM  03/18/23  Teola Bradley, LPN

## 2023-03-19 ENCOUNTER — Encounter: Payer: Self-pay | Admitting: Nurse Practitioner

## 2023-03-19 ENCOUNTER — Ambulatory Visit
Admission: RE | Admit: 2023-03-19 | Discharge: 2023-03-19 | Disposition: A | Discharge status: Home / Self Care | Payer: BLUE CROSS/BLUE SHIELD | Source: Ambulatory Visit | Attending: Nurse Practitioner | Admitting: Nurse Practitioner

## 2023-03-19 ENCOUNTER — Ambulatory Visit: Payer: BLUE CROSS/BLUE SHIELD | Attending: Neurological Surgery | Admitting: Nurse Practitioner

## 2023-03-19 VITALS — BP 132/85 | HR 67 | Ht 62.0 in | Wt 105.0 lb

## 2023-03-19 DIAGNOSIS — S22000D Wedge compression fracture of unspecified thoracic vertebra, subsequent encounter for fracture with routine healing: Secondary | ICD-10-CM

## 2023-03-19 NOTE — Progress Notes (Signed)
[facility]  Follow-up Note    Date: 03/19/2023  Patient Name: [name], [name]   [id number]  Patient Care Team:  Staci Righter, MD as PCP - General (Internal Medicine)  Nyoka Cowden, MD as Consulting Physician (Gastroenterology)       History of Present Illness:     [name] is a 62 y.o. female with history of fall on 01/13/2023.  She presented with lower extremity weakness and MRI lumbar revealed T10 vertebral body fracture and fluid collection, suspected hematoma posterior to the T12 vertebral body extending to the posterior of L1 vertebral body.  She was treated with TLSO.    Today she reports normal lower extremity strength and no weakness to bilateral lower extremity.  No bowel or bladder incontinence.  Pain is affected her balance and she holds on, due to pain.    Pain is across the lower back, along posterior to the pelvic crest, and right posterior lower ribs.     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    History was obtained from chart review and the patient.    Past Medical History:     Past Medical History:   Diagnosis Date    Chronic ulcerative colitis     nio issues for long time    Claustrophobia     Clotting disorder     DVT LLE 2012    Deep venous thrombosis of calf 2012    LLL/on xeralto//    GERD (gastroesophageal reflux disease)     Glaucoma NEC     bilat    Hypertensive disorder     controlled well with medication    Ovarian cyst, right     Plantar fasciitis     Seasonal allergies        Past Surgical History:     Past Surgical History[1]    Family History:     Family History[2]    Social History:     Social History     Socioeconomic History    Marital status: Married     Spouse name: None    Number of children: None    Years of education: None    Highest education level: None   Occupational History    None   Tobacco Use    Smoking status: Former     Current packs/day: 0.00     Types: Cigarettes     Quit date: 12/21/2010     Years since quitting:  12.2    Smokeless tobacco: Never   Vaping Use    Vaping status: Never Used   Substance and Sexual Activity    Alcohol use: Yes     Alcohol/week: 1.0 standard drink of alcohol     Types: 1 Cans of beer per week     Comment: 1/week    Drug use: No    Sexual activity: Yes     Partners: Male     Birth control/protection: Surgical   Other Topics Concern    None   Social History Narrative    None     Social Determinants of Health     Financial Resource Strain: Low Risk  (01/14/2023)    Overall Financial Resource Strain (CARDIA)     Difficulty of Paying Living Expenses: Not hard at all   Food Insecurity: No Food Insecurity (01/13/2023)    Hunger Vital Sign     Worried About Running Out of Food in the Last  Year: Never true     Ran Out of Food in the Last Year: Never true   Transportation Needs: No Transportation Needs (01/14/2023)    PRAPARE - Therapist, art (Medical): No     Lack of Transportation (Non-Medical): No   Physical Activity: Not on file   Stress: Not on file   Social Connections: Not on file   Intimate Partner Violence: Not At Risk (01/13/2023)    Humiliation, Afraid, Rape, and Kick questionnaire     Fear of Current or Ex-Partner: No     Emotionally Abused: No     Physically Abused: No     Sexually Abused: No   Housing Stability: Low Risk  (01/14/2023)    Housing Stability Vital Sign     Unable to Pay for Housing in the Last Year: No     Number of Times Moved in the Last Year: 1     Homeless in the Last Year: No       Allergies:     Allergies[3]    Medications:         Review of Systems:        Vital Signs:     Vitals:    03/19/23 1142   BP: 132/85   Pulse: 67       Physical Exam:      General: The patient was well developed and well nourished.  No acute distress. Cooperative with the examination  Neck: Trachea midline  Pulmonary: normal respiratory effort.   Cardiovascular: no diaphoresis to forehead  Extremities: hands normal in color  Skin: no rash to visible skin  Mental Status: The  patient is awake, alert and oriented to person, place, and time.    Affect is normal. Fund of knowledge appropriate.   Recent and remote memory are intact. Attention span and concentration appear normal.  No delusions or hallucinations. Language function is normal.   There is no evidence of aphasia in conversational speech.  Cranial nerves:   -CN II: Visual fields full    -CN III, IV, VI: Pupils equal, round, and reactive to light; extraocular movements intact; no ptosis                                          -CN VII: Face symmetric   -CN VIII: Hearing intact to conversational speech   -CN IX, X:  normal phonation   -CN XI: Symmetric trapezius muscles   Motor: Muscle tone normal without spasticity or flaccidity. No atrophy.    BUE/BLE 5/5  Coordination: No tremors  Gait: Station normal, gait stable   GCS 15  Wearing TLSO brace    No point tenderness to the thoracolumbar spine    Point tenderness to the right posterior lower ribs    Labs:     Lab Results   Component Value Date    WBC 5.69 01/22/2023    HGB 10.7 (L) 01/22/2023    HCT 32.0 (L) 01/22/2023    MCV 102.6 (H) 01/22/2023    PLT 240 01/22/2023     Lab Results   Component Value Date    BUN 7 01/22/2023     Lab Results   Component Value Date    CREAT 0.6 01/22/2023     Lab Results   Component Value Date    INR 1.0 01/14/2023    INR  1.4 (H) 01/02/2011    INR 1.1 01/01/2011    PT 10.8 01/14/2023    PT 16.7 (H) 01/02/2011    PT 13.7 01/01/2011        Imaging:   Personally reviewed images with the patient    Xray   thoracolumbar  03/19/2023   ***  No results found.      Impression & Plan   62 y.o. female now 2 months from history of fall sustained acute fracture of T10, T11 and T12, and fluid posterior to to the vertebral bodies of T12-L1.    Right lower rib pain greater than midline spine pain.    She has been treated with brace.  She has been compliant with bracing.  X-rays reveals stable compression deformity of T12, stable planum of T11, and stable superior  endplate fracture of T12, with expected increased kyphosis about the T11 vertebral body fracture on upright films, without plain film evidence of retropulsion into the canal.    Begin to wean off TLSO brace over 1 month.  Orders Placed This Encounter   Procedures    XR Thoracolumbar Spine AP Lateral     Requested Prescriptions      No prescriptions requested or ordered in this encounter       Follow up:   Follow-up 3 months from fall with repeat x-ray      Morgin Halls Renard Matter, NP                   [1]   Past Surgical History:  Procedure Laterality Date    BUNIONECTOMY      left foot    CATARACT EXTRACTION      EYE SURGERY Bilateral 2013    laser surgery for glaucoma, bilateral trabeculectomy    GLAUCOMA SURGERY      HYSTERECTOMY      removed both ovaries and tubes    IVC FILTER PLACEMENT N/A 01/16/2023    Procedure: Temporary IVC FILTER PLACEMENT;  Surgeon: Tamala Bari, MD;  Location: [facility];  Service: Interventional Radiology;  Laterality: N/A;    revision of hysterectomyl suture line      REVISION, BLEB, SCLERAL PATCH GRAFT Left 05/25/2019    Procedure: REVISION, BLEB;  Surgeon: Lamount Cranker, MD;  Location: [facility];  Service: Ophthalmology;  Laterality: Left;    TONSILLECTOMY      TRABECULECTOMY Left 03/22/2014    Procedure: TRABECULECTOMY;  Surgeon: Pecola Leisure, MD;  Location: [facility];  Service: Ophthalmology;  Laterality: Left;  TRABECULECTOMY REVISION, LEFT EYE  Q1=N  ANES=MAC  EQUIP=MITOMYCIN C, 0.4mg /cc, DISPENSE 5cc (ORDER TO FOLLOW)  MD REQ 30 MIN  ALLERGIES=COSOPT, AZOPT, ALPHAGAN  **Need RESIGHT SET from SPD    ureterectomy left     [2]   Family History  Problem Relation Name Age of Onset    Hypertension Father Fayrene Fearing     Vision loss Father Fayrene Fearing     Vision loss Brother Tim     Hypertension Paternal Aunt Marge     Vision loss Paternal Odette Horns    [3]   Allergies  Allergen Reactions    Latex Itching

## 2023-04-06 ENCOUNTER — Telehealth: Payer: Self-pay

## 2023-04-06 ENCOUNTER — Other Ambulatory Visit: Payer: Self-pay | Admitting: Diagnostic Radiology

## 2023-04-06 DIAGNOSIS — Z86718 Personal history of other venous thrombosis and embolism: Secondary | ICD-10-CM

## 2023-04-06 NOTE — Telephone Encounter (Signed)
Confirmed arrival time (1030am). [place] at 11am. Consult for IVC filter at 12:30pm.

## 2023-04-09 ENCOUNTER — Ambulatory Visit
Admission: RE | Admit: 2023-04-09 | Discharge: 2023-04-09 | Disposition: A | Discharge status: Home / Self Care | Payer: BLUE CROSS/BLUE SHIELD | Source: Ambulatory Visit | Attending: Diagnostic Radiology | Admitting: Diagnostic Radiology

## 2023-04-09 ENCOUNTER — Ambulatory Visit: Payer: BLUE CROSS/BLUE SHIELD

## 2023-04-09 VITALS — BP 182/93 | HR 66 | Temp 97.0°F | Resp 16

## 2023-04-09 DIAGNOSIS — I82512 Chronic embolism and thrombosis of left femoral vein: Secondary | ICD-10-CM

## 2023-04-09 DIAGNOSIS — I82432 Acute embolism and thrombosis of left popliteal vein: Secondary | ICD-10-CM | POA: Insufficient documentation

## 2023-04-09 DIAGNOSIS — Z86718 Personal history of other venous thrombosis and embolism: Secondary | ICD-10-CM | POA: Insufficient documentation

## 2023-04-09 DIAGNOSIS — I82412 Acute embolism and thrombosis of left femoral vein: Secondary | ICD-10-CM | POA: Insufficient documentation

## 2023-04-09 NOTE — Progress Notes (Signed)
Pt met with dr Marquette Saa post [place] doplers for IVC filter removal. Dr Marquette Saa approved removal.     Pt prefers Dr Marquette Saa and Einar Gip but due to scheduling and availability it may be necessary to use another facility or another IR MD.     Scrip for labwork given to patient with instructions to wait until procedure date and time have been confirmed before she gets the lab work completed.     Scheduling sheet with order for procedure and order for labs placed in scheduling bin.     Copy of procedure order and lab work order placed in medical records bin.

## 2023-04-09 NOTE — Progress Notes (Unsigned)
INTERVENTIONAL RADIOLOGY OUTPATIENT CONSULT NOTE    Date Time: April 09, 2023 3:20 PM  Patient Name: [name]  Attending Physician: Rex Kras, Pace      History of Presenting Illness:   [name] is a 62 y.o. female who presents for further discussion and evaluation for temporary IVC filter retrieval. She has a hx of DVT in 2012, UC, recently admitted in July 2024 with acute T10 fracture and epidural hematoma in the setting of LLE DVT. Due to inability to anticoagulate, a temp IVC filter was placed on 01/16/2023.    Overall, she is feeling well with some persistent back pain. She has continued to take her [facility]. Denies LE edema, pain, skin discoloration, CP, SOB, dyspnea.    Past Medical History:     Past Medical History:   Diagnosis Date    Chronic ulcerative colitis     nio issues for long time    Claustrophobia     Clotting disorder     DVT LLE 2012    Deep venous thrombosis of calf 2012    LLL/on xeralto//    GERD (gastroesophageal reflux disease)     Glaucoma NEC     bilat    Hypertensive disorder     controlled well with medication    Ovarian cyst, right     Plantar fasciitis     Seasonal allergies        Past Surgical History:   Past Surgical History[1]    Family History:   Family History[2]    Social History:   Social History[3]     Allergies:   Allergies[4]    Medications:   Medications Ordered Prior to Encounter[5]     Review of Systems:   Review of Systems   Constitutional: Negative.    HENT: Negative.     Eyes: Negative.    Respiratory: Negative.     Cardiovascular: Negative.    Gastrointestinal: Negative.    Genitourinary: Negative.    Musculoskeletal:  Positive for back pain. Negative for myalgias.   Skin: Negative.    Neurological: Negative.    Endo/Heme/Allergies: Negative.    Psychiatric/Behavioral: Negative.          Physical Exam:     Vitals:    04/09/23 1140   BP: (!) 182/93   Pulse: 66   Resp: 16   Temp: 97 F (36.1 C)   SpO2: 100%     Gen: WDWN female, NAD  HEENT: NCAT. MMM  CV:  WWP  Pulm: NWOB  Ext: WWP, no sig edema    Labs:   Reviewed    Rads:   US Venous Duplex Doppler Leg Bilateral    Result Date: 04/09/2023   1. RIGHT LOWER EXTREMITY: No evidence of DVT. Right popliteal vein thrombus has resolved. 2. LEFT LOWER EXTREMITY: Residual nonocclusive thrombus involving the femoral and popliteal vein. This has improved since the prior study of 01/15/2023. Alejandra Boehringer, Pace 04/09/2023 1:21 PM    XR Thoracolumbar Spine AP Lateral    Result Date: 03/24/2023  1.Redemonstrated T10, T11, T12, L1, L2, and L3 vertebral body fractures with variable height loss, similar to comparison cross-sectional imaging. 2.Chronic grade 1 anterolisthesis of L3 on L4 and slight retrolisthesis of L4 on L5. 3.Exaggerated lower thoracic kyphosis. 4.Multilevel degenerative changes. Alejandra Gentles MD, Pace 03/24/2023 2:44 PM    CT Chest Abdomen Pelvis W Contrast    Result Date: 01/17/2023  1.No evidence of metastatic disease in the chest, abdomen or  pelvis. 2.Bilateral acute/subacute pulmonary emboli. 3.Redemonstration of deep venous thrombosis in the left superficial and left common femoral veins. Mixing artifact versus thrombus in the right common femoral and right external iliac veins. 4.Trace bilateral pleural effusions, left greater than right. 5.Thoracolumbar vertebral body fractures better assessed on the prior lumbar spine MRI. [name] 01/17/2023 7:32 PM    IVC Filter Placement    Result Date: 01/17/2023  Temporary IVC filter placement, bard Denali. [name] 01/17/2023 4:46 PM    US Venous Duplex Doppler Leg Bilateral    Result Date: 01/15/2023   1. Nonocclusive thrombus within the right popliteal vein. 2. Thrombus within the left common femoral, greater saphenous, femoral, popliteal, and gastrocnemius veins as described. 3. These urgent results were discussed with and acknowledged by [name] on 01/15/2023 8:41 PM. [name] 01/15/2023 8:42 PM    US Aorta IVC Iliac Duplex Doppler Complete    Result Date:  01/15/2023   1. Nonocclusive thrombus within the right popliteal vein. 2. Thrombus within the left common femoral, greater saphenous, femoral, popliteal, and gastrocnemius veins as described. 3. These urgent results were discussed with and acknowledged by [name] on 01/15/2023 8:41 PM. [name] 01/15/2023 8:42 PM    XR Thoracic Spine AP And Lateral    Result Date: 01/15/2023  Limited visualization. Multilevel compression fractures. Melody Haver, Pace 01/15/2023 10:59 AM    MRI Lumbar Spine W WO Contrast    Result Date: 01/14/2023  1.T10 vertebral body superior endplate incomplete burst fracture, with 2 mm retropulsion, with marrow edema consistent with recent likely acute fracture. 2.Focal abnormal fluid collection measuring 6 mm centered at T12 vertebrae extending from mid inferior T11 to mid L1 level indenting the anterior thecal sac displacing the thecal sac nonspecific but likely representing small hematoma. 3.T11 vertebral body subacute compression fracture, unchanged in alignment. 4.T12 vertebral body superior endplate subacute compression fracture, unchanged alignment. 5.Old superior endplate L1 L3 compression fractures, unchanged. These urgent findings discussed with and acknowledged by Dr. Frederich Pace at 01/14/2023 9:47 AM. Alejandra Post Einar Pheasant, Pace 01/14/2023 10:01 AM    CT Lumbar Spine without Contrast    Result Date: 01/13/2023   1.Compared to prior exam of 01/20/2021, there is new moderate compression deformity of T12 vertebral body with associated bony fragmentation. Although this is new from the prior exam of 2022, this is probably a chronic fracture. Recommend correlation with history and examination. 2.Redemonstrated severe T11, moderate L1, L2, L3 compression deformities. 3.Disc bulge and ligamentum flavum thickening resulting in severe spinal canal stenosis at L3-L4 level. 4.3-mm anterolisthesis of L3 on L4. 5.Diffuse bony demineralization. 6.Partially visualized left pleural effusion. Alejandra Guardian,  Pace 01/13/2023 9:59 PM      Assessment:   13F hx VTE on [facility], s/p temp IVCF placement in Jul 2024 due to contraindication to receiving AC in setting of LLE DVT, now back on [facility] LLE thrombus significantly improved, with remnant more chronic-appearing, nonocclusive thrombus in L prox FV and pop vein. R/b/a of filter retrieval procedure d/w pt, who agrees to proceed. All questions answered.    Plan:   - Plan for IVCF retrieval at earliest convenience. Moderate sedation for procedure. Pt prefers procedure to be done at [facility].  - Continue AC  - Pre-procedure labs; rx given    Signed by: Denton Lank, Pace  [facility]- Vascular & Interventional Radiology    Contact Numbers:  Regular business hours (8A-5P M-F):  Tyson Babinski [facility]: [phone]  After hours:  Answering service:  [phone]       [1]   Past Surgical History:  Procedure Laterality Date    BUNIONECTOMY      left foot    CATARACT EXTRACTION      EYE SURGERY Bilateral 2013    laser surgery for glaucoma, bilateral trabeculectomy    GLAUCOMA SURGERY      HYSTERECTOMY      removed both ovaries and tubes    IVC FILTER PLACEMENT N/A 01/16/2023    Procedure: Temporary IVC FILTER PLACEMENT;  Surgeon: Tamala Bari, Pace;  Location: [facility];  Service: Interventional Radiology;  Laterality: N/A;    revision of hysterectomyl suture line      REVISION, BLEB, SCLERAL PATCH GRAFT Left 05/25/2019    Procedure: REVISION, BLEB;  Surgeon: Lamount Cranker, Pace;  Location: [place] MAIN OR;  Service: Ophthalmology;  Laterality: Left;    TONSILLECTOMY      TRABECULECTOMY Left 03/22/2014    Procedure: TRABECULECTOMY;  Surgeon: Pecola Leisure, Pace;  Location: [place] MAIN OR;  Service: Ophthalmology;  Laterality: Left;  TRABECULECTOMY REVISION, LEFT EYE  Q1=N  ANES=MAC  EQUIP=MITOMYCIN C, 0.4mg /cc, DISPENSE 5cc (ORDER TO FOLLOW)  Pace REQ 30 MIN  ALLERGIES=COSOPT, AZOPT, ALPHAGAN  **Need RESIGHT SET from SPD    ureterectomy left     [2]   Family History  Problem Relation Name Age  of Onset    Hypertension Father Fayrene Fearing     Vision loss Father Fayrene Fearing     Vision loss Brother Tim     Hypertension Paternal Land     Vision loss Paternal Odette Horns    [3]   Social History  Socioeconomic History    Marital status: Married   Tobacco Use    Smoking status: Former     Pace packs/day: 0.00     Types: Cigarettes     Quit date: 12/21/2010     Years since quitting: 12.3    Smokeless tobacco: Never   Vaping Use    Vaping status: Never Used   Substance and Sexual Activity    Alcohol use: Yes     Alcohol/week: 1.0 standard drink of alcohol     Types: 1 Cans of beer per week     Comment: 1/week    Drug use: No    Sexual activity: Yes     Partners: Male     Birth control/protection: Surgical     Social Drivers of Health     Financial Resource Strain: Low Risk  (01/14/2023)    Overall Financial Resource Strain (CARDIA)     Difficulty of Paying Living Expenses: Not hard at all   Food Insecurity: No Food Insecurity (01/13/2023)    Hunger Vital Sign     Worried About Running Out of Food in the Last Year: Never true     Ran Out of Food in the Last Year: Never true   Transportation Needs: No Transportation Needs (01/14/2023)    PRAPARE - Transportation     Lack of Transportation (Medical): No     Lack of Transportation (Non-Medical): No   Intimate Partner Violence: Not At Risk (01/13/2023)    Humiliation, Afraid, Rape, and Kick questionnaire     Fear of Pace or Ex-Partner: No     Emotionally Abused: No     Physically Abused: No     Sexually Abused: No   Housing Stability: Low Risk  (01/14/2023)    Housing Stability Vital  Sign     Unable to Pay for Housing in the Last Year: No     Number of Times Moved in the Last Year: 1     Homeless in the Last Year: No   [4]   Allergies  Allergen Reactions    Latex Itching   [5]   Pace Outpatient Medications on File Prior to Visit   Medication Sig Dispense Refill    acetaminophen (Tylenol) 325 MG tablet Take 2 tablets (650 mg) by mouth every 4 (four) hours as needed for Pain       desloratadine (CLARINEX) 5 MG tablet Take 1 tablet (5 mg) by mouth daily      dexlansoprazole (Dexilant) 60 MG capsule Take 1 capsule (60 mg) by mouth daily      hydrocortisone 2.5 % cream APPLY TO AFFECTED AREA TWICE DAILY      methocarbamol (ROBAXIN) 500 MG tablet Take 1 tablet (500 mg) by mouth 4 (four) times daily      metoprolol (LOPRESSOR) 100 MG tablet Take 0.5 tablets (50 mg) by mouth 2 (two) times daily      rivaroxaban (XARELTO) 10 MG Tab Take 1 tablet (10 mg) by mouth daily      timolol (TIMOPTIC) 0.5 % ophthalmic solution Place 1 drop into the left eye daily      Yuvafem 10 MCG Tab Place 10 mcg vaginally every third day      ergocalciferol (ERGOCALCIFEROL) 1.25 MG (50000 UT) capsule 1 capsule (50,000 Units)      lidocaine (LIDODERM) 5 % Place 1 patch onto the skin every 24 hours Remove & Discard patch within 12 hours or as directed by Pace (Patient not taking: Reported on 04/09/2023)      multivitamin (MULTIVITAMIN) Tab Take 1 tablet by mouth daily (Patient not taking: Reported on 04/09/2023)      senna-docusate (PERICOLACE) 8.6-50 MG per tablet Take 2 tablets by mouth 2 (two) times daily as needed for Constipation (Patient not taking: Reported on 04/09/2023)       No Pace facility-administered medications on file prior to visit.

## 2023-04-19 ENCOUNTER — Other Ambulatory Visit (INDEPENDENT_AMBULATORY_CARE_PROVIDER_SITE_OTHER): Payer: Self-pay | Admitting: Internal Medicine

## 2023-04-20 ENCOUNTER — Telehealth: Payer: Self-pay | Admitting: Internal Medicine

## 2023-04-20 ENCOUNTER — Telehealth: Payer: Self-pay

## 2023-04-20 NOTE — Telephone Encounter (Signed)
I am not part of the team taking care of patients anticoagulation.  Responding to results of US venous lower extremity Dopplers which shows new subacute occlusive right popliteal venous thrombus clot which was not present on 04/09/2023 I give a call to patient to update about the same.  She is seeing her PCP Dr. Conley Rolls as an outpatient who has been managing her anticoagulation.  Currently she has been on Xarelto 10 mg daily.  She did discuss with IR yesterday and she was advised against IVC filter removal and repeating venous Doppler of bilateral lower extremity numbness time.      Patient would like to follow-up with outpatient mental setting for further anticoagulation recommendations.  Will help her schedule an appointment but in the interim advised her to talk with Dr. Conley Rolls about further anticoagulation recommendation.  Patient was appreciative of the call and verbalized understanding about the plan.

## 2023-04-20 NOTE — Telephone Encounter (Signed)
[facility] New Patient Coordinator Note    Physician/Location Preference:    Location Preference: [facility]    Physician Preference: next available     Referral:    Referring Provider: Daisey Must D MD    Is Referral required per insurance? No          Has the Appointment been scheduled? Yes Pt scheduled with Dr. Linna Darner on 02/06 at 11 AM. Provided directions to the clinic. Labs and notes in chart. 10/29 4 17  PM

## 2023-04-22 ENCOUNTER — Encounter: Payer: Self-pay | Admitting: Nurse Practitioner

## 2023-04-22 ENCOUNTER — Ambulatory Visit
Admission: RE | Admit: 2023-04-22 | Discharge: 2023-04-22 | Disposition: A | Discharge status: Home / Self Care | Payer: BLUE CROSS/BLUE SHIELD | Source: Ambulatory Visit | Attending: Nurse Practitioner | Admitting: Nurse Practitioner

## 2023-04-22 ENCOUNTER — Ambulatory Visit: Payer: BLUE CROSS/BLUE SHIELD | Attending: Neurological Surgery | Admitting: Nurse Practitioner

## 2023-04-22 ENCOUNTER — Telehealth: Payer: Self-pay | Admitting: Nurse Practitioner

## 2023-04-22 VITALS — BP 138/88 | HR 68 | Ht 62.0 in | Wt 111.0 lb

## 2023-04-22 DIAGNOSIS — S22000D Wedge compression fracture of unspecified thoracic vertebra, subsequent encounter for fracture with routine healing: Secondary | ICD-10-CM

## 2023-04-22 NOTE — Progress Notes (Signed)
 [facility]  Follow-up Note    Date: 04/27/2023  Patient Name: [name], [name]   [id number]  Patient Care Team:  Staci Righter, MD as PCP - General (Internal Medicine)  Nyoka Cowden, MD as Consulting Physician (Gastroenterology)       History

## 2023-04-22 NOTE — Telephone Encounter (Signed)
Per andrea ,   just her copay for the visitwill be due at check in , but once it goes to claim not sure if there will be facility charges

## 2023-05-17 ENCOUNTER — Other Ambulatory Visit: Payer: Self-pay | Admitting: Diagnostic Radiology

## 2023-05-17 DIAGNOSIS — Z01812 Encounter for preprocedural laboratory examination: Secondary | ICD-10-CM

## 2023-05-17 NOTE — Plan of Care (Addendum)
Pre-procedure interview completed on 05/17/23    Procedure: IVC Filter removal    [x]  Order for IVC filter removal per Dr. Marquette Saa on 04/09/23    Surgeon Testing Requirements:  Component      Latest Ref Rng 05/24/2023   PT      10.1 - 12.9 sec 26.2 (H)    PT INR      0.9 - 1.1  2.3       Component      Latest Ref Rng 05/24/2023   Glucose      70 - 100 mg/dL 78    BUN      7 - 21 mg/dL 5 (L)    Creatinine      0.4 - 1.0 mg/dL 0.7    Calcium      8.5 - 10.5 mg/dL 9.2    Sodium      315 - 145 mEq/L 139    Potassium      3.5 - 5.3 mEq/L 5.0    Chloride      99 - 111 mEq/L 107    CO2      17 - 29 mEq/L 22    Anion Gap      5.0 - 15.0  10.0    EGFR      >=60.0 mL/min/1.73 m2 >60.0    Hemolysis Index      Index 2         Component      Latest Ref Rng 05/24/2023   WBC      3.10 - 9.50 x10 3/uL 5.74    Hemoglobin      11.4 - 14.8 g/dL 9.2 (L)    Hematocrit      34.7 - 43.7 % 29.1 (L)    Platelet Count      142 - 346 x10 3/uL 348 (H)    MPV      8.9 - 12.5 fL 9.4    RBC      3.90 - 5.10 x10 6/uL 3.88 (L)    MCV      78.0 - 96.0 fL 75.0 (L)    MCH      25.1 - 33.5 pg 23.7 (L)    MCHC      31.5 - 35.8 g/dL 40.0    RDW      11 - 15 % 18 (H)    Nucleated RBC      <=0.0 /100 WBC 0.0    Nucleated RBC Absolute      <=0.00 x10 3/uL 0.00        Anesthesia Guideline Requirements:  Nothing to eat after 11pm the night before the procedure, clears ok until 9am, and after 9am nothing to drink  Continue Xarelto    Ride home:   Husband will take patient to/from procedure    E-mail Sent To Patient: Patient verbally consented to have post interview email sent to email address below:  [email or web address]   Emailed information included:   [x]  Points of Contact: [email or web address] or phone [phone] to patient/family member          [x]  NPO Instructions per Preoperative Fasting Guidelines for Elective Surgeries and Procedures Requiring Anesthesia Policy         Important Patient Reminders Discussed with Patient:   Please bring: Picture ID,  Insurance Card and Co pay (if required)   Case for Glasses, Dentures, Hearing Aides (if applicable)  Advanced Directive (if applicable)  Bring Crutches, Walker, Sling, Shoe/Boot and/or Brace as  directed (if applicable)     General Patient Instructions:  Leave valuables and jewelry at home. Remove all piercings and rings.  Do not apply lotions, creams, powders, cologne/perfume, hairspray and makeup on the day of surgery  Avoid alcohol and smoking  Do not shave or use hair removal products for 24 hours prior to surgery  Wear loose-fitting and comfortable clothing.  Make arrangements for transportation home- patients will not be allowed to drive or take public transportation alone after sedation or anesthesia.   Any patient illness within 48 hours of surgery, patient instructed to notify their surgeon prior to coming in to the hospital.

## 2023-05-18 ENCOUNTER — Other Ambulatory Visit (INDEPENDENT_AMBULATORY_CARE_PROVIDER_SITE_OTHER): Payer: Self-pay | Admitting: Internal Medicine

## 2023-05-24 ENCOUNTER — Other Ambulatory Visit (INDEPENDENT_AMBULATORY_CARE_PROVIDER_SITE_OTHER): Payer: BLUE CROSS/BLUE SHIELD

## 2023-05-24 DIAGNOSIS — Z01812 Encounter for preprocedural laboratory examination: Secondary | ICD-10-CM

## 2023-05-24 LAB — BASIC METABOLIC PANEL
Anion Gap: 10 (ref 5.0–15.0)
BUN: 5 mg/dL — ABNORMAL LOW (ref 7–21)
CO2: 22 meq/L (ref 17–29)
Calcium: 9.2 mg/dL (ref 8.5–10.5)
Chloride: 107 meq/L (ref 99–111)
Creatinine: 0.7 mg/dL (ref 0.4–1.0)
GFR: >60 mL/min/{1.73_m2} (ref >=60.0)
Glucose: 78 mg/dL (ref 70–100)
Hemolysis Index: 2 {index}
Potassium: 5 meq/L (ref 3.5–5.3)
Sodium: 139 meq/L (ref 135–145)

## 2023-05-24 LAB — CBC
Absolute nRBC: 0 10*3/uL (ref <=0.00)
Hematocrit: 29.1 % — ABNORMAL LOW (ref 34.7–43.7)
Hemoglobin: 9.2 g/dL — ABNORMAL LOW (ref 11.4–14.8)
MCH: 23.7 pg — ABNORMAL LOW (ref 25.1–33.5)
MCHC: 31.6 g/dL (ref 31.5–35.8)
MCV: 75 fL — ABNORMAL LOW (ref 78.0–96.0)
MPV: 9.4 fL (ref 8.9–12.5)
Platelet Count: 348 10*3/uL — ABNORMAL HIGH (ref 142–346)
RBC: 3.88 10*6/uL — ABNORMAL LOW (ref 3.90–5.10)
RDW: 18 % — ABNORMAL HIGH (ref 11–15)
WBC: 5.74 10*3/uL (ref 3.10–9.50)
nRBC %: 0 /100{WBCs} (ref <=0.0)

## 2023-05-24 LAB — PT/INR
INR: 2.3 (ref 0.9–1.1)
PT: 26.2 s — ABNORMAL HIGH (ref 10.1–12.9)

## 2023-05-26 ENCOUNTER — Ambulatory Visit
Admission: RE | Admit: 2023-05-26 | Discharge: 2023-05-26 | Disposition: A | Discharge status: Home / Self Care | Payer: BLUE CROSS/BLUE SHIELD | Source: Ambulatory Visit | Attending: Diagnostic Radiology | Admitting: Diagnostic Radiology

## 2023-05-26 ENCOUNTER — Encounter
Admission: RE | Disposition: A | Discharge status: Home / Self Care | Payer: Self-pay | Source: Ambulatory Visit | Attending: Diagnostic Radiology

## 2023-05-26 ENCOUNTER — Encounter: Payer: Self-pay | Admitting: Diagnostic Radiology

## 2023-05-26 DIAGNOSIS — Z86718 Personal history of other venous thrombosis and embolism: Secondary | ICD-10-CM | POA: Insufficient documentation

## 2023-05-26 DIAGNOSIS — I824Y9 Acute embolism and thrombosis of unspecified deep veins of unspecified proximal lower extremity: Secondary | ICD-10-CM

## 2023-05-26 DIAGNOSIS — Z87891 Personal history of nicotine dependence: Secondary | ICD-10-CM | POA: Insufficient documentation

## 2023-05-26 DIAGNOSIS — Z7901 Long term (current) use of anticoagulants: Secondary | ICD-10-CM | POA: Insufficient documentation

## 2023-05-26 DIAGNOSIS — Z8673 Personal history of transient ischemic attack (TIA), and cerebral infarction without residual deficits: Secondary | ICD-10-CM | POA: Insufficient documentation

## 2023-05-26 DIAGNOSIS — Z4689 Encounter for fitting and adjustment of other specified devices: Secondary | ICD-10-CM | POA: Insufficient documentation

## 2023-05-26 HISTORY — PX: IVC FILTER REMOVAL: IMG2591

## 2023-05-26 SURGERY — IVC FILTER REMOVAL
Anesthesia: Conscious Sedation

## 2023-05-26 MED ORDER — FENTANYL CITRATE (PF) 50 MCG/ML IJ SOLN (WRAP)
INTRAMUSCULAR | Status: AC | PRN
Start: 2023-05-26 — End: 2023-05-26
  Administered 2023-05-26: 50 ug via INTRAVENOUS

## 2023-05-26 MED ORDER — MIDAZOLAM HCL 1 MG/ML IJ SOLN (WRAP)
INTRAMUSCULAR | Status: AC | PRN
Start: 2023-05-26 — End: 2023-05-26
  Administered 2023-05-26: 1 mg via INTRAVENOUS

## 2023-05-26 MED ORDER — FENTANYL CITRATE (PF) 50 MCG/ML IJ SOLN (WRAP)
INTRAMUSCULAR | Status: AC
Start: 2023-05-26 — End: ?
  Filled 2023-05-26: qty 4

## 2023-05-26 MED ORDER — DIPHENHYDRAMINE HCL 50 MG/ML IJ SOLN
INTRAMUSCULAR | Status: AC | PRN
Start: 2023-05-26 — End: 2023-05-26
  Administered 2023-05-26: 25 mg via INTRAVENOUS

## 2023-05-26 MED ORDER — FENTANYL CITRATE (PF) 50 MCG/ML IJ SOLN (WRAP)
INTRAMUSCULAR | Status: AC | PRN
Start: 2023-05-26 — End: 2023-05-26
  Administered 2023-05-26: 25 ug via INTRAVENOUS

## 2023-05-26 MED ORDER — LIDOCAINE-EPINEPHRINE 2 %-1:100000 IJ SOLN
INTRAMUSCULAR | Status: AC
Start: 2023-05-26 — End: ?
  Filled 2023-05-26: qty 20

## 2023-05-26 MED ORDER — FLUMAZENIL 0.5 MG/5ML IV SOLN
INTRAVENOUS | Status: AC
Start: 2023-05-26 — End: ?
  Filled 2023-05-26: qty 5

## 2023-05-26 MED ORDER — IODIXANOL 320 MG/ML IV SOLN
20.0000 mL | Freq: Once | INTRAVENOUS | Status: AC | PRN
Start: 2023-05-26 — End: 2023-05-26
  Administered 2023-05-26: 20 mL via INTRAVENOUS

## 2023-05-26 MED ORDER — ONDANSETRON HCL 4 MG/2ML IJ SOLN
INTRAMUSCULAR | Status: AC
Start: 2023-05-26 — End: ?
  Filled 2023-05-26: qty 2

## 2023-05-26 MED ORDER — DIPHENHYDRAMINE HCL 50 MG/ML IJ SOLN
INTRAMUSCULAR | Status: AC
Start: 2023-05-26 — End: ?
  Filled 2023-05-26: qty 1

## 2023-05-26 MED ORDER — MIDAZOLAM HCL 1 MG/ML IJ SOLN (WRAP)
INTRAMUSCULAR | Status: AC
Start: 2023-05-26 — End: ?
  Filled 2023-05-26: qty 4

## 2023-05-26 MED ORDER — LIDOCAINE 1% BUFFERED - CNR/OUTSOURCED
INTRAMUSCULAR | Status: AC | PRN
Start: 2023-05-26 — End: 2023-05-26
  Administered 2023-05-26: 7 mL via INTRADERMAL

## 2023-05-26 MED ORDER — MIDAZOLAM HCL 1 MG/ML IJ SOLN (WRAP)
INTRAMUSCULAR | Status: AC | PRN
Start: 2023-05-26 — End: 2023-05-26
  Administered 2023-05-26: .5 mg via INTRAVENOUS

## 2023-05-26 MED ORDER — NALOXONE HCL 0.4 MG/ML IJ SOLN (WRAP)
INTRAMUSCULAR | Status: AC
Start: 2023-05-26 — End: ?
  Filled 2023-05-26: qty 1

## 2023-05-26 MED ORDER — LIDOCAINE 1% BUFFERED - CNR/OUTSOURCED
INTRAMUSCULAR | Status: AC
Start: 2023-05-26 — End: ?
  Filled 2023-05-26: qty 11

## 2023-05-26 SURGICAL SUPPLY — 10 items
GUIDEWIRE VASCULAR OD.035 IN L180 CM (Guidewire) ×1
GUIDEWIRE VASCULAR OD.035 IN L180 CM L9.5 CM AMPLATZ STRAIGHT TAPER (Guidewire) IMPLANT
GUIDEWIRE VASCULAR OD.035 IN L260 CM L7 (Guidewire)
GUIDEWIRE VASCULAR OD.035 IN L260 CM L7 CM AMPLATZ L3 CM STRAIGHT (Guidewire) IMPLANT
KIT MICROINTRODUCER L7 CM MAX COAXIAL (Introducer) ×1
KIT MICROINTRODUCER L7 CM MAX COAXIAL GUIDEWIRE ECHOGENIC NEEDLE OD4 (Introducer) IMPLANT
SET RETRIEVAL L80 CM 3 WAY STOPCOCK (Retrieval Devices) ×1
SET RETRIEVAL L80 CM 3 WAY STOPCOCK CATHETER DILATOR GUIDEWIRE OD6.3 (Retrieval Devices) IMPLANT
TRAY SRG ALL PURPOSE (Pack) ×1
TRAY SURGICAL ALL PURPOSE FOAKS (Pack) IMPLANT

## 2023-05-26 NOTE — Discharge Instr - AVS First Page (Addendum)
Interventional Radiology  Discharge Instructions for Inferior Vena Cava (IVC) Filter Removal    Your inferior vena cava filter was removed by Dr. Bonita Quin on 05/26/2023.    Access Site: ***                                  Activity:  No driving for 24 hours following the procedure if you received medication for sedation during the procedure.  Avoid alcohol for the next 24 hours if you received medication for sedation during the procedure.  No heavy lifting (greater than 10 pounds) for 5-7 days.  This includes pushing, pulling, dragging, or moving anything greater than 10 pounds for 5-7 days.  No strenuous activity or exercising (such as bicycling, jogging, shoveling snow, chopping wood, etc), bending at the waist or touching toes for 24 hours following the procedure.    Do not attempt anything that may cause fatigue, shortness of breath, perspiration, or chest pain.   Rest today and tomorrow, then gradually increase to your usual activities.      Access Site Care:  No immersing, up to your neck, in water (tub baths, pools) for one week or until the puncture site in your neck is completely healed.  This is a neck VEIN puncture.    You may shower 24 hours after your procedure and REMOVE the dressing either before or during your shower. Wash gently with soap, then pat the area dry.   Do not rub, pick or scratch the area.   Do not apply creams, powders, lotions, or ointments to the site for 1 week.   Observe for signs of infection:  redness, warmth, swelling, drainage, or temperature greater than 101 degrees F.  If you suspect infection, call the doctor who performed the procedure.     Normal Observation:  Local site tenderness, which may last up to 1 week. You may take Acetaminophen (Tylenol) if needed.  You may experience some mild bruising, which may last up to 2 weeks        When to call the doctor:  Bleeding or hematoma at the puncture site: This is a vein that is punctured in your neck.  Sit down and apply firm  pressure at the site and call your doctor or the Interventional Radiologist.  Sitting up i.e., keeping your head elevated, will also help decrease any oozing with the help of gravity.    If you are on blood thinners, you are at higher risk of oozing at the site.  Usually, the site will stop oozing within 10 minutes of moderate to firm pressure.  After 10 minutes, with clean hands, remove the dressing, and check for continued oozing.  If still oozing, place a clean dry bandaid at the site and continue to hold moderate pressure for another 5 to 10 minutes.  Remove the bandaid and place another clean, dry bandaid.      If you have questions or concerns, please call an Interventional Radiologist:    Contact Numbers:  Regular business hours (8A-5P M-F):  Tyson Babinski [facility]: [phone]  After hours:  Answering service:  [phone]

## 2023-05-26 NOTE — Progress Notes (Signed)
Pt came to IR department for IVC filter removal.  Consents signed with Dr. Bonita Quin.     Pt was sterile prepped and draped per protocol by IR tech, Jae Dire.  An 11 Fr venous sheath was placed in R IJ via ultrasound and fluoroscopic guidance.  IVC filter removed with all pieces intact.  Fluoroscopic confirmation.  Sheath removed and manual pressure held by [employer], Jae Dire.   A sterile dry gauze dressing placed to right neck puncture site.  Pt tolerated procedure well and brought to IR recovery in stable condition.  Report given to Trinna Post, RN.

## 2023-05-26 NOTE — Progress Notes (Signed)
Patient being discharged home. Vital signs stable, IV removed with catheter intact. No complaints of pain, nausea or vomiting. Patient tolerating PO. Dressing clean and intact, with no noted bleeding or drainage.     Discharge instructions reviewed with patient and family member over speaker phone. Both displayed verbal understanding of discharge instructions. No questions at this time.     Patient will be escorted to [facility] Lobby via wheelchair with copy of discharge instructions.

## 2023-05-26 NOTE — H&P (Signed)
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    PAST MEDICAL HISTORY:   Medical History[1]    PAST SURGICAL HISTORY   Past Surgical History[2]    Family History:   Family History[3]    Social History:     Social History     Socioeconomic History    Marital status: Married     Spouse name: Not on file    Number of children: Not on file    Years of education: Not on file    Highest education level: Not on file   Occupational History    Not on file   Tobacco Use    Smoking status: Former     Current packs/day: 0.00     Types: Cigarettes     Quit date: 12/21/2010     Years since quitting: 12.4    Smokeless tobacco: Never   Vaping Use    Vaping status: Never Used   Substance and Sexual Activity    Alcohol use: Yes     Alcohol/week: 1.0 standard drink of alcohol     Types: 1 Cans of beer per week     Comment: 1/week    Drug use: No    Sexual activity: Yes     Partners: Male     Birth control/protection: Surgical   Other Topics Concern    Not on file   Social History Narrative    Not on file     Social Drivers of Health     Financial Resource Strain: Low Risk  (05/25/2023)    Overall Financial Resource Strain (CARDIA)     Difficulty of Paying Living Expenses: Not hard at all   Food Insecurity: No Food Insecurity (05/25/2023)    Hunger Vital Sign     Worried About Running Out of Food in the Last Year: Never true     Ran Out of Food in the Last Year: Never true   Transportation Needs: No Transportation Needs (05/25/2023)    PRAPARE - Therapist, art (Medical): No     Lack of Transportation (Non-Medical): No   Physical Activity: Unknown (05/25/2023)    Exercise Vital Sign     Days of Exercise per Week: 2 days     Minutes of Exercise per Session: Not on file   Stress: No Stress Concern Present (05/25/2023)    [employer] of Occupational Health - Occupational Stress Questionnaire     Feeling of Stress : Not at all   Social Connections: Moderately Integrated (05/25/2023)    Social Connection and Isolation Panel [NHANES]     Frequency of Communication with Friends and Family: Twice a week     Frequency of Social Gatherings with Friends and Family: Once a week     Attends Religious Services: 1 to 4 times per year     Active Member of [employer] or Organizations: No     Attends Banker Meetings: Never     Marital Status: Married   Catering manager Violence: Not At Risk (05/25/2023)    Humiliation, Afraid, Rape, and Kick questionnaire     Fear of Current or Ex-Partner: No     Emotionally Abused: No     Physically Abused:  No     Sexually Abused: No   Housing Stability: Low Risk  (05/25/2023)    Housing Stability Vital Sign     Unable to Pay for Housing in the Last Year: No     Number of Times Moved in the Last Year: 0     Homeless in the Last Year: No         REVIEW OF SYSTEMS REVIEWED:   YES  ( x )        HOME MEDICATIONS     Prior to Admission medications    Medication Sig Start Date End Date Taking? Authorizing Provider   acetaminophen (Tylenol) 325 MG tablet Take 2 tablets (650 mg) by mouth every 4 (four) hours as needed for Pain 01/22/23  Yes Ashiny, Zelalem A, MD   desloratadine (CLARINEX) 5 MG tablet Take 1 tablet (5 mg) by mouth daily   Yes [provider]   dexlansoprazole (Dexilant) 60 MG capsule Take 1 capsule (60 mg) by mouth daily   Yes [provider]   hydrocortisone 2.5 % cream 2 (two) times daily as needed 10/31/18  Yes [provider]   metoprolol (LOPRESSOR) 100 MG tablet Take 0.5 tablets (50 mg) by mouth 2 (two) times daily   Yes [provider]   rivaroxaban (XARELTO) 10 MG Tab Take 1 tablet (10 mg) by mouth daily   Yes [provider]   timolol (TIMOPTIC) 0.5 % ophthalmic solution Place 1 drop into the left eye daily 03/15/20  Yes [provider]    Yuvafem 10 MCG Tab Place 10 mcg vaginally every third day   Yes [provider]         INPATIENT MEDICATIONS              ALLERGIES:   Allergies[4]      PREVIOUS REACTION TO SEDATION MEDICATIONS     NO ( x )   YES (  )      PHYSICAL EXAM     AIRWAY CLASSIFICATION:    CLASS I   (  )   CLASS II  ( x )    CLASS III  (  )     CLASS IV  (  )    INTUBATED (  )    CARDIAC :   RRR      LUNGS:   NLR      LABS:     Lab Results   Component Value Date/Time    WBC 5.74 05/24/2023 12:50 PM    WBC 7.20 01/12/2020 02:15 PM    HCT 29.1 (L) 05/24/2023 12:50 PM    HCT 33.3 (L) 01/12/2020 02:15 PM    PLT 348 (H) 05/24/2023 12:50 PM    PLT 286 01/12/2020 02:15 PM    INR 2.3 05/24/2023 12:50 PM    INR 1.4 (H) 01/02/2011 07:00 AM    PT 26.2 (H) 05/24/2023 12:50 PM    PT 16.7 (H) 01/02/2011 07:00 AM    PTT 30 01/19/2023 09:21 AM    PTT 59 (H) 12/31/2010 12:16 AM    BUN 5 (L) 05/24/2023 12:50 PM    BUN 9 01/12/2020 02:15 PM    CREAT 0.7 05/24/2023 12:50 PM    CREAT 0.8 01/12/2020 02:15 PM    GLU 78 05/24/2023 12:50 PM    GLU 103 (H) 01/12/2020 02:15 PM    K 5.0 05/24/2023 12:50 PM    K 4.6 01/12/2020 02:15 PM  ASA PHYSICAL STATUS   (  )  ASA 1   HEALTHY PATIENT  (  )  ASA 2   MILD SYSTEMIC ILLNESS  ( x )  ASA 3   SYSTEMIC DISEASE, NOT INCAPACITATING  (  )  ASA 4   SEVERE SYSTEMIC DISEASE, DISEASE IS CONSTANT THREAT TO LIFE  (  )  ASA 5   MORIBUND CONDITION, NOT EXPECTED TO LIVE >24 HOURS            IRRESPECTIVE OF PROCEDURE  (  )  E           EMERGENCY PROCEDURE       PLANNED SEDATION:   (  ) NO SEDATION  ( x ) MODERATE SEDATION  (  ) DEEP SEDATION WITH ANESTHESIA      CONCLUSION:   PATIENT HAS BEEN REASSESSED IMMEDIATELY PRIOR TO THE PROCEDURE   AND IS AN APPROPRIATE CANDIDATE FOR THE PLANNED SEDATION AND   PROCEDURE.  RISKS, BENEFITS AND ALTERNATIVES TO THE PLANNED   PROCEDURE AND SEDATION HAVE BEEN EXPLAINED TO THE PATIENT   OR GUARDIAN.    ( x )  YES  (  )  EMERGENCY CONSENT       Signed by: Tamala Bari, MD  [facility]-Section of Vascular & Interventional Radiology  Contact Numbers:  Regular business hours (8A-5P M-F):  [facility]:  [phone] (option 3-outpatient scheduling, option 4-consults, option 5-inpatient procedures)  [facility]:  [phone]  [facility]: [phone]  After hours/Answering service:  [phone]         [1]   Past Medical History:  Diagnosis Date    Chronic ulcerative colitis     nio issues for long time    Claustrophobia     Clotting disorder     DVT LLE 2012    Deep venous thrombosis of calf 2012    LLL/on xeralto//    GERD (gastroesophageal reflux disease)     Glaucoma NEC     bilat    Hypertensive disorder     controlled well with medication    Ovarian cyst, right     Plantar fasciitis     Seasonal allergies    [2]   Past Surgical History:  Procedure Laterality Date    BUNIONECTOMY      left foot    CATARACT EXTRACTION      EYE SURGERY Bilateral 2013    laser surgery for glaucoma, bilateral trabeculectomy    GLAUCOMA SURGERY      HYSTERECTOMY      removed both ovaries and tubes    IVC FILTER PLACEMENT N/A 01/16/2023    Procedure: Temporary IVC FILTER PLACEMENT;  Surgeon: Tamala Bari, MD;  Location: [facility];  Service: Interventional Radiology;  Laterality: N/A;    revision of hysterectomyl suture line      REVISION, BLEB, SCLERAL PATCH GRAFT Left 05/25/2019    Procedure: REVISION, BLEB;  Surgeon: Lamount Cranker, MD;  Location: [place] [facility];  Service: Ophthalmology;  Laterality: Left;    TONSILLECTOMY      TRABECULECTOMY Left 03/22/2014    Procedure: TRABECULECTOMY;  Surgeon: Pecola Leisure, MD;  Location: [place] [facility];  Service: Ophthalmology;  Laterality: Left;  TRABECULECTOMY REVISION, LEFT EYE  Q1=N  ANES=MAC  EQUIP=MITOMYCIN C, 0.4mg /cc, DISPENSE 5cc (ORDER TO FOLLOW)  MD REQ 30 MIN  ALLERGIES=COSOPT, AZOPT, ALPHAGAN  **Need RESIGHT SET from SPD    ureterectomy left     [  3]   Family History  Problem Relation Name  Age of Onset    Hypertension Father Fayrene Fearing     Vision loss Father Fayrene Fearing     Vision loss Brother Tim     Hypertension Paternal Aunt Marge     Vision loss Paternal Odette Horns    [4]   Allergies  Allergen Reactions    Latex Itching

## 2023-05-26 NOTE — Brief Op Note (Signed)
BRIEF OP NOTE    Date Time: 05/26/23 10:58 AM    Patient Name:   [name], [name] (MRN: [id number])    Date of Operation:   05/26/2023     Providers Performing:   Surgeons and Role:     * Marzella Miracle, Loren Racer, MD - Primary    Surgical First Assistant(s):   None    Operative Procedure:   IVC Filter Removal: 52841 (CPT)    Preoperative Diagnosis:   Pre-Op Diagnosis Codes:      * Deep vein thrombosis (DVT) of proximal lower extremity, unspecified chronicity, unspecified laterality [I82.4Y9]    Postoperative Diagnosis:   Post-Op Diagnosis Codes:     * Deep vein thrombosis (DVT) of proximal lower extremity, unspecified chronicity, unspecified laterality [I82.4Y9]    Findings:   IVCF removal    Anesthesia:   No value filed.    Estimated Blood Loss:    * No values recorded between 05/26/2023 10:04 AM and 05/26/2023 10:58 AM *    Implants:   * No implants in log *        Drains:   Drains: no            Specimens:   * No specimens in log *       Complications:   None     Signed by: Tamala Bari, MD                                                                           [facility]

## 2023-05-30 ENCOUNTER — Encounter: Payer: Self-pay | Admitting: Diagnostic Radiology

## 2023-06-01 ENCOUNTER — Encounter: Payer: Self-pay | Admitting: Nurse Practitioner

## 2023-06-03 ENCOUNTER — Ambulatory Visit: Payer: BLUE CROSS/BLUE SHIELD | Attending: Neurological Surgery | Admitting: Nurse Practitioner

## 2023-06-03 ENCOUNTER — Encounter: Payer: Self-pay | Admitting: Nurse Practitioner

## 2023-06-03 VITALS — BP 123/85 | HR 73 | Ht 62.0 in | Wt 107.0 lb

## 2023-06-03 DIAGNOSIS — S22000D Wedge compression fracture of unspecified thoracic vertebra, subsequent encounter for fracture with routine healing: Secondary | ICD-10-CM

## 2023-06-03 NOTE — Progress Notes (Signed)
[facility]  Follow-up Note    Date: 06/03/2023  Patient Name: [name], [name]   [id number]  Patient Care Team:  Staci Righter, MD as PCP - General (Internal Medicine)  Nyoka Cowden, MD as Consulting Physician (Gastroenterology)       History of Present Illness:     [name] is a 62 y.o. female with history of fall on 01/13/2023.  She presented with lower extremity weakness and MRI lumbar revealed T10 vertebral body fracture and fluid collection, suspected hematoma posterior to the T12 vertebral body extending to the posterior of L1 vertebral body.    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    At her last visit we discontinued the TLSO brace.  Today  she reports doing well similar to her last visit.  No radiating symptoms to her legs.    She presents with x-ray for review.    History was obtained from chart review and the patient.    Past Medical History:     Past Medical History:   Diagnosis Date    Chronic ulcerative colitis     nio issues for long time    Claustrophobia     Clotting disorder     DVT LLE 2012    Deep venous thrombosis of calf 2012    LLL/on xeralto//    GERD (gastroesophageal reflux disease)     Glaucoma NEC     bilat    Hypertensive disorder     controlled well with medication    Ovarian cyst, right     Plantar fasciitis     Seasonal allergies        Past Surgical History:     Past Surgical History[1]    Family History:     Family History[2]    Social History:     Social History     Socioeconomic History    Marital status: Married     Spouse name: None    Number of children: None    Years of education: None    Highest education level: None   Occupational History    None   Tobacco Use    Smoking status: Former     Current packs/day: 0.00     Types: Cigarettes     Quit date: 12/21/2010     Years since quitting: 12.4    Smokeless tobacco: Never   Vaping Use    Vaping status: Never Used   Substance and Sexual Activity    Alcohol use: Yes      Alcohol/week: 1.0 standard drink of alcohol     Types: 1 Cans of beer per week     Comment: 1/week    Drug use: No    Sexual activity: Yes     Partners: Male     Birth control/protection: Surgical   Other Topics Concern    None   Social History Narrative    None     Social Drivers of Health     Financial Resource Strain: Low Risk  (05/25/2023)    Overall Financial Resource Strain (CARDIA)     Difficulty of Paying Living Expenses: Not hard at all   Food Insecurity: No Food Insecurity (05/25/2023)    Hunger Vital Sign     Worried About Running Out of Food in the Last Year: Never true     Ran Out of Food in the Last Year: Never true   Transportation Needs: No Transportation  Needs (05/25/2023)    PRAPARE - Therapist, art (Medical): No     Lack of Transportation (Non-Medical): No   Physical Activity: Unknown (05/25/2023)    Exercise Vital Sign     Days of Exercise per Week: 2 days     Minutes of Exercise per Session: Not on file   Stress: No Stress Concern Present (05/25/2023)    [employer] of Occupational Health - Occupational Stress Questionnaire     Feeling of Stress : Not at all   Social Connections: Moderately Integrated (05/25/2023)    Social Connection and Isolation Panel [NHANES]     Frequency of Communication with Friends and Family: Twice a week     Frequency of Social Gatherings with Friends and Family: Once a week     Attends Religious Services: 1 to 4 times per year     Active Member of [employer] or Organizations: No     Attends Banker Meetings: Never     Marital Status: Married   Catering manager Violence: Not At Risk (05/25/2023)    Humiliation, Afraid, Rape, and Kick questionnaire     Fear of Current or Ex-Partner: No     Emotionally Abused: No     Physically Abused: No     Sexually Abused: No   Housing Stability: Low Risk  (05/25/2023)    Housing Stability Vital Sign     Unable to Pay for Housing in the Last Year: No     Number of Times Moved in the Last Year: 0      Homeless in the Last Year: No       Allergies:     Allergies[3]    Medications:         Review of Systems:        Vital Signs:     Vitals:    06/03/23 1258   BP: 123/85   Pulse: 73       Physical Exam:      General: The patient was well developed and well nourished.  No acute distress. Cooperative with the examination  Neck: Trachea midline  Pulmonary: normal respiratory effort.   Cardiovascular: no diaphoresis to forehead  Extremities: hands normal in color  Skin: no rash to visible skin  Mental Status: The patient is awake, alert and oriented to person, place, and time.    Affect is normal. Fund of knowledge appropriate.   Recent and remote memory are intact. Attention span and concentration appear normal.  No delusions or hallucinations. Language function is normal.   There is no evidence of aphasia in conversational speech.  Cranial nerves:   -CN II: Visual fields full    -CN III, IV, VI: Pupils equal, round, and reactive to light; extraocular movements intact; no ptosis                                          -CN VII: Face symmetric   -CN VIII: Hearing intact to conversational speech   -CN IX, X:  normal phonation   -CN XI: Symmetric trapezius muscles   Motor: Muscle tone normal without spasticity or flaccidity. No atrophy.     BLE 5/5  Coordination: No tremors  Gait: Station normal, gait stable   GCS 15       Labs:     Lab Results   Component Value Date  WBC 5.74 05/24/2023    HGB 9.2 (L) 05/24/2023    HCT 29.1 (L) 05/24/2023    MCV 75.0 (L) 05/24/2023    PLT 348 (H) 05/24/2023     Lab Results   Component Value Date    BUN 5 (L) 05/24/2023     Lab Results   Component Value Date    CREAT 0.7 05/24/2023     Lab Results   Component Value Date    INR 2.3 05/24/2023    INR 1.0 01/14/2023    INR 1.4 (H) 01/02/2011    PT 26.2 (H) 05/24/2023    PT 10.8 01/14/2023    PT 16.7 (H) 01/02/2011        Imaging:   Personally reviewed images with the patient    MRI of [place] (viewed via physician portal)   no CD   Xray    Thoracic  There is no acute fracture or compression deformity.  There is mild diffuse osteopenia.  There is dextroscoliosis of the thoracic spine centered at T12-L1.  There is chronic severe loss of the vertebral body height at T11, unchanged.  There is age-indeterminate moderate loss of vertebral body height at T10 and T12.  There is chronic mild to moderate loss of vertebral body height of L1 and L2.  IMPRESSION:  Multilevel loss of vertebral height as detailed.  Albayati, MD  06/01/23    Impression & Plan   61 y.o. female now 3 months from history of fall sustained acute fracture of T10, T11 and T12, and fluid posterior to to the vertebral bodies of T12-L1.    X-ray reveals stable chronic fractures of thoracic spine, and similar kyphosis compared to 3 months ago  No orders of the defined types were placed in this encounter.    Requested Prescriptions      No prescriptions requested or ordered in this encounter       Follow up:   As needed    Thaddeus Evitts Renard Matter, NP                   [1]   Past Surgical History:  Procedure Laterality Date    BUNIONECTOMY      left foot    CATARACT EXTRACTION      EYE SURGERY Bilateral 2013    laser surgery for glaucoma, bilateral trabeculectomy    GLAUCOMA SURGERY      HYSTERECTOMY      removed both ovaries and tubes    IVC FILTER PLACEMENT N/A 01/16/2023    Procedure: Temporary IVC FILTER PLACEMENT;  Surgeon: Tamala Bari, MD;  [facility];  Service: Interventional Radiology;  Laterality: N/A;    IVC FILTER REMOVAL N/A 05/26/2023    Procedure: IVC Filter Removal;  Surgeon: Tamala Bari, MD;  Location: [facility];  Service: Interventional Radiology;  Laterality: N/A;    revision of hysterectomyl suture line      REVISION, BLEB, SCLERAL PATCH GRAFT Left 05/25/2019    Procedure: REVISION, BLEB;  Surgeon: Lamount Cranker, MD;  Location: [facility];  Service: Ophthalmology;  Laterality: Left;    TONSILLECTOMY      TRABECULECTOMY Left 03/22/2014    Procedure: TRABECULECTOMY;   Surgeon: Pecola Leisure, MD;  Location: [facility];  Service: Ophthalmology;  Laterality: Left;  TRABECULECTOMY REVISION, LEFT EYE  Q1=N  ANES=MAC  EQUIP=MITOMYCIN C, 0.4mg /cc, DISPENSE 5cc (ORDER TO FOLLOW)  MD REQ 30 MIN  ALLERGIES=COSOPT, AZOPT, ALPHAGAN  **Need  RESIGHT SET from SPD    ureterectomy left     [2]   Family History  Problem Relation Name Age of Onset    Hypertension Father Fayrene Fearing     Vision loss Father Fayrene Fearing     Vision loss Brother Tim     Hypertension Paternal Aunt Marge     Vision loss Paternal Odette Horns    [3]   Allergies  Allergen Reactions    Latex Itching

## 2023-07-29 ENCOUNTER — Telehealth: Payer: Self-pay | Admitting: Medical Oncology

## 2023-07-29 ENCOUNTER — Ambulatory Visit: Payer: BLUE CROSS/BLUE SHIELD | Attending: Medical Oncology | Admitting: Medical Oncology

## 2023-07-29 VITALS — BP 108/74 | HR 90 | Temp 98.3°F | Resp 16 | Wt 122.0 lb

## 2023-07-29 DIAGNOSIS — I8290 Acute embolism and thrombosis of unspecified vein: Secondary | ICD-10-CM

## 2023-07-29 NOTE — Telephone Encounter (Signed)
RTC in 5 weeks

## 2023-07-29 NOTE — Telephone Encounter (Signed)
Pt aware of:    Labs at [facility] on 3/5    F/u with Dr. Linna Darner on 3/12    I LVM advising pt of LLE Doppler US on 3/6 11am at [facility], [address] [place]. Arrive by 10:45, bring photo ID and insurance card.

## 2023-07-29 NOTE — Progress Notes (Signed)
 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  History of 2 provoked blood clots.  Currently on Xarelto .  She required placement of IVC filter placement which has not been removed.  I will recheck ultrasound of the left lower extremity in the next 4 weeks, will recheck D-dimer levels.  If negative, we will take her off of blood thinners.  No indication to check hypercoagulability profile at this time  Continue follow-up with primary care physician, continue with age-appropriate cancer screening and neurosurgery follow-up for the vertebral fracture  Return to clinic in 5 weeks    History:   [name] is a 63 y.o. female who presents to the office for evaluation for history of left lower extremity VTE.  Currently on Xarelto .  VT appears to be provoked in the context of prolonged immobilization from vertebral fracture.  Prior history of VTE in the context of OCP use  Past Medical History:     Past Medical History:   Diagnosis Date    Chronic ulcerative colitis     nio issues for long time    Claustrophobia     Clotting disorder     DVT LLE 2012    Deep venous thrombosis of calf 2012    LLL/on xeralto//    GERD (gastroesophageal reflux disease)     Glaucoma NEC     bilat    Hypertensive disorder     controlled well with medication    Ovarian cyst, right     Plantar fasciitis     Seasonal allergies        Past Surgical History:   Past Surgical History[1]    Family  History:   Family History[2]    Social History:     Social History     Tobacco Use    Smoking status: Former     Current packs/day: 0.00     Types: Cigarettes     Quit date: 12/21/2010     Years since quitting: 12.6    Smokeless tobacco: Never   Substance Use Topics    Alcohol use: Yes     Alcohol/week: 1.0 standard drink of alcohol     Types: 1 Cans of beer per week     Comment: 1/week       Social History     Social History Narrative    Not on file     Allergies:   Allergies[3]    Medications:   Current Medications[4]     Review of Systems:   HEMATOLOGY FOCUSED ROS:    General  no fever, chills, night sweats, generalized weakness or fatigue.   Heme: No bleeding, bruising, rash, or swelling in the neck, underarms or groin.  No pain or swelling in upper or lower extremities.  All other systems were reviewed and were negative except as indicated in the HPI.  Physical Exam:   Visit Vitals  BP 108/74 (BP Site: Right arm, Patient Position: Sitting, Cuff Size: Medium)   Pulse 90   Temp 98.3 F (36.8 C) (Oral)   Resp 16   Wt 55.3 kg (122 lb)   SpO2 98%   BMI 22.31 kg/m       General appearance -  well appearing, and in no distress   Mental status - alert, oriented to person, place, and time   Eyes - sclerae anicteric, slight conjunctival pallor   Mouth - mucous membranes moist   Lymph nodes - without cervical, axillary or inguinal adenopathy  Neck - supple,without adenopathy   Chest - clear to auscultation  Heart - normal rate, regular rhythm, without murmur   Abdomen - soft, nontender, nondistended, no masses or organomegaly   Extremities - no pedal edema, no clubbing or cyanosis  Skin - without ecchymoses, petechiae or rash  Neurologic- without localizing deficit      Labs Reviewed:     Lab Results   Component Value Date    WBC 5.74 05/24/2023    HGB 9.2 (L) 05/24/2023    PLT 348 (H) 05/24/2023    MCV 75.0 (L) 05/24/2023        Lab Results   Component Value Date    NA 139 05/24/2023    K 5.0 05/24/2023    CL 107  05/24/2023    CA 9.2 05/24/2023    BUN 5 (L) 05/24/2023    CO2 22 05/24/2023    CREAT 0.7 05/24/2023    GLU 78 05/24/2023    AST 41 01/13/2023    ALT 30 01/13/2023    ALKPHOS 214 (H) 01/13/2023    BILITOTAL 0.8 01/13/2023        Lab Results   Component Value Date    PTT 30 01/19/2023    PT 26.2 (H) 05/24/2023    INR 2.3 05/24/2023       Rads:   No results found.    Signed by:     Harvin Serve, MD  Hematology and Medical Oncology      [facility]          [1]   Past Surgical History:  Procedure Laterality Date    BUNIONECTOMY      left foot    CATARACT EXTRACTION      EYE SURGERY Bilateral 2013    laser surgery for glaucoma, bilateral trabeculectomy    GLAUCOMA SURGERY      HYSTERECTOMY      removed both ovaries and tubes    IVC FILTER PLACEMENT N/A 01/16/2023    Procedure: Temporary IVC FILTER PLACEMENT;  Surgeon: Rosine Camellia GRADE, MD;  [facility]: [facility];  Service: Interventional Radiology;  Laterality: N/A;    IVC FILTER REMOVAL N/A 05/26/2023    Procedure: IVC Filter Removal;  Surgeon: Rosine Camellia GRADE, MD;  Location: [facility];  Service: Interventional Radiology;  Laterality: N/A;    revision of hysterectomyl suture line      REVISION, BLEB, SCLERAL PATCH GRAFT Left 05/25/2019    Procedure: REVISION, BLEB;  Surgeon: Dominique Lucendia FERNS, MD;  Location: [place] [facility];  Service: Ophthalmology;  Laterality: Left;    TONSILLECTOMY      TRABECULECTOMY Left 03/22/2014    Procedure: TRABECULECTOMY;  Surgeon: Shade Glennda HERO, MD;  Location: [place] [facility];  Service: Ophthalmology;  Laterality: Left;  TRABECULECTOMY REVISION, LEFT EYE  Q1=N  ANES=MAC  EQUIP=MITOMYCIN  C, 0.4mg /cc, DISPENSE 5cc (  ORDER TO FOLLOW)  MD REQ 30 MIN  ALLERGIES=COSOPT, AZOPT, ALPHAGAN  **Need RESIGHT SET from SPD    ureterectomy left     [2]   Family History  Problem Relation Name Age of Onset    Hypertension Father Lynwood     Vision loss Father Lynwood     Vision loss Brother Tim     Hypertension Paternal Aunt Marge     Vision loss Paternal Higinio [place]    [3]   Allergies  Allergen Reactions    Latex Itching   [4]   Current Outpatient Medications:     acetaminophen  (Tylenol ) 325 MG tablet, Take 2 tablets (650 mg) by mouth every 4 (four) hours as needed for Pain, Disp: , Rfl:     desloratadine (CLARINEX) 5 MG tablet, Take 1 tablet (5 mg) by mouth daily, Disp: , Rfl:     dexlansoprazole (Dexilant) 60 MG capsule, Take 1 capsule (60 mg) by mouth daily, Disp: , Rfl:     hydrocortisone  2.5 % cream, 2 (two) times daily as needed, Disp: , Rfl:     metoprolol  (LOPRESSOR ) 100 MG tablet, Take 0.5 tablets (50 mg) by mouth 2 (two) times daily, Disp: , Rfl:     rivaroxaban  (XARELTO ) 10 MG Tab, Take 1 tablet (10 mg) by mouth daily, Disp: , Rfl:     timolol  (TIMOPTIC ) 0.5 % ophthalmic solution, Place 1 drop into the left eye daily, Disp: , Rfl:     Yuvafem 10 MCG Tab, Place 10 mcg vaginally every third day, Disp: , Rfl:

## 2023-08-23 ENCOUNTER — Telehealth: Payer: Self-pay

## 2023-08-23 NOTE — Telephone Encounter (Signed)
 Pt called to ask why she has blood worlk ordered for thsi Wednesday?   Nothing has been linked to the lab appt- Only open order is for  D-Dimer    If this is needed to be drawn this week please advise pt as to why- TY!

## 2023-08-23 NOTE — Telephone Encounter (Signed)
 Called to patient to notify that lab appointment is for d-dimer. LMOM to provide update. Requested that patient call back if she has any questions or needs to reschedule lab appointment.

## 2023-08-25 ENCOUNTER — Other Ambulatory Visit (HOSPITAL_BASED_OUTPATIENT_CLINIC_OR_DEPARTMENT_OTHER): Payer: BLUE CROSS/BLUE SHIELD

## 2023-08-25 DIAGNOSIS — I8290 Acute embolism and thrombosis of unspecified vein: Secondary | ICD-10-CM

## 2023-08-26 ENCOUNTER — Other Ambulatory Visit (INDEPENDENT_AMBULATORY_CARE_PROVIDER_SITE_OTHER): Payer: Self-pay | Admitting: Medical Oncology

## 2023-08-26 LAB — D-DIMER: D-Dimer: 0.53 ug{FEU}/mL — ABNORMAL HIGH (ref <0.50)

## 2023-09-01 ENCOUNTER — Ambulatory Visit: Payer: BLUE CROSS/BLUE SHIELD | Attending: Medical Oncology | Admitting: Medical Oncology

## 2023-09-01 ENCOUNTER — Telehealth: Payer: Self-pay | Admitting: Medical Oncology

## 2023-09-01 VITALS — BP 109/73 | HR 86 | Temp 98.1°F | Resp 16 | Wt 124.6 lb

## 2023-09-01 DIAGNOSIS — I8290 Acute embolism and thrombosis of unspecified vein: Secondary | ICD-10-CM

## 2023-09-01 NOTE — Progress Notes (Signed)
 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  Appearing in no acute distress.  Reviewed blood work, continues to have elevated dimer level of 0.53.  Also, ultrasound of the lower extremity shows a persistent nonocclusive blood clot which has remained stable.  Given these findings, we recommended continuation of Xarelto  for at least the time being and repeating testing in around 6 months time.  Patient and her spouse are interested in coming off Xarelto , their biggest barrier is the cost issue.  No evidence of significant bleeding on Xarelto .  She is tolerated it for the last 10+ years well.  They are not interested in pursuing any other systemic anticoagulation at this time.  Other option is using low-dose aspirin prophylactically.    History:   [name] is a 63 y.o. female who presents for follow-up visit for VTE  Past Medical History:     Past Medical History:   Diagnosis Date    Chronic ulcerative colitis (CMS/HCC)     nio issues for long time    Claustrophobia     Clotting disorder     DVT LLE 2012    Deep venous thrombosis of calf (CMS/HCC) 2012    LLL/on xeralto//    GERD (gastroesophageal reflux disease)     Glaucoma NEC     bilat    Hypertensive disorder     controlled well with medication    Ovarian cyst, right     Plantar fasciitis     Seasonal allergies        Past Surgical History:   Past Surgical History[1]    Family History:   Family  History[2]    Social History:     Social History     Tobacco Use    Smoking status: Former     Current packs/day: 0.00     Types: Cigarettes     Quit date: 12/21/2010     Years since quitting: 12.7    Smokeless tobacco: Never   Substance Use Topics    Alcohol use: Yes     Alcohol/week: 1.0 standard drink of alcohol     Types: 1 Cans of beer per week     Comment: 1/week       Social History     Social History Narrative    Not on file     Allergies:   Allergies[3]    Medications:   Current Medications[4]     Review of Systems:   HEMATOLOGY FOCUSED ROS:    General  no fever, chills, night sweats, generalized weakness or fatigue.   Heme: No bleeding, bruising, rash, or swelling in the neck, underarms or groin.  No pain or swelling in upper or lower extremities.  All other systems were reviewed and were negative except as indicated in the HPI.      Physical Exam:  Visit Vitals  BP 109/73 (BP Site: Left arm, Patient Position: Sitting, Cuff Size: Medium)   Pulse 86   Temp 98.1 F (36.7 C) (Oral)   Resp 16   Wt 56.5 kg (124 lb 9.6 oz)   SpO2 97%   BMI 22.79 kg/m         General appearance -  well appearing, and in no distress   Mental status - alert, oriented to person, place, and time   Eyes - sclerae anicteric, slight conjunctival pallor   Mouth - mucous membranes moist   Lymph nodes - without cervical, axillary or inguinal adenopathy  Neck - supple,without adenopathy   Chest - clear to auscultation  Heart - normal rate, regular rhythm, without murmur   Abdomen - soft, nontender, nondistended, no masses or organomegaly   Extremities - no pedal edema, no clubbing or cyanosis  Skin - without ecchymoses, petechiae or rash  Neurologic- without localizing deficit      Labs Reviewed:     Lab Results   Component Value Date    WBC 5.74 05/24/2023    HGB 9.2 (L) 05/24/2023    PLT 348 (H) 05/24/2023    MCV 75.0 (L) 05/24/2023        Lab Results   Component Value Date    NA 139 05/24/2023    K 5.0 05/24/2023    CL 107 05/24/2023     CA 9.2 05/24/2023    BUN 5 (L) 05/24/2023    CO2 22 05/24/2023    CREAT 0.7 05/24/2023    GLU 78 05/24/2023    AST 41 01/13/2023    ALT 30 01/13/2023    ALKPHOS 214 (H) 01/13/2023    BILITOTAL 0.8 01/13/2023        Lab Results   Component Value Date    PTT 30 01/19/2023    PT 26.2 (H) 05/24/2023    INR 2.3 05/24/2023    DDIMER 0.53 (H) 08/25/2023       Rads:   US  Venous Low Extrem Duplx Dopp Uni Left    Result Date: 08/26/2023   1.  No sonographic evidence for left lower extremity acute deep venous thrombosis. 2.  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 Electronically signed by: Dionicio Capri D.O. [facility]      Signed by:     Harvin Serve, MD  Hematology and Medical Oncology      [facility]          [1]   Past Surgical History:  Procedure Laterality Date    BUNIONECTOMY      left foot    CATARACT EXTRACTION      EYE SURGERY Bilateral 2013    laser surgery for glaucoma, bilateral trabeculectomy    GLAUCOMA SURGERY      HYSTERECTOMY      removed both ovaries and tubes    IVC FILTER PLACEMENT N/A 01/16/2023    Procedure: Temporary IVC FILTER PLACEMENT;  Surgeon: Rosine Camellia GRADE, MD;  Location: [facility];  Service: Interventional Radiology;  Laterality: N/A;    IVC FILTER REMOVAL N/A 05/26/2023    Procedure: IVC Filter Removal;  Surgeon: Rosine Camellia GRADE, MD;  [facility];  Service: Interventional Radiology;  Laterality: N/A;    revision of hysterectomyl suture line      REVISION, BLEB, SCLERAL PATCH GRAFT Left 05/25/2019    Procedure: REVISION, BLEB;  Surgeon: Dominique Lucendia FERNS, MD;  Location:  [place] [facility];  Service: Ophthalmology;  Laterality: Left;    TONSILLECTOMY      TRABECULECTOMY Left 03/22/2014    Procedure: TRABECULECTOMY;  Surgeon: Shade Glennda HERO, MD;  Location: [place] [facility];  Service: Ophthalmology;  Laterality: Left;  TRABECULECTOMY REVISION, LEFT EYE  Q1=N  ANES=MAC   EQUIP=MITOMYCIN  C, 0.4mg /cc, DISPENSE 5cc (ORDER TO FOLLOW)  MD REQ 30 MIN  ALLERGIES=COSOPT, AZOPT, ALPHAGAN  **Need RESIGHT SET from SPD    ureterectomy left     [2]   Family History  Problem Relation Name Age of Onset    Hypertension Father Lynwood     Vision loss Father Lynwood     Vision loss Brother Tim     Hypertension Paternal Aunt Marge     Vision loss Paternal Higinio [place]    [3]   Allergies  Allergen Reactions    Latex Itching   [4]   Current Outpatient Medications:     acetaminophen  (Tylenol ) 325 MG tablet, Take 2 tablets (650 mg) by mouth every 4 (four) hours as needed for Pain, Disp: , Rfl:     desloratadine (CLARINEX) 5 MG tablet, Take 1 tablet (5 mg) by mouth daily, Disp: , Rfl:     dexlansoprazole (Dexilant) 60 MG capsule, Take 1 capsule (60 mg) by mouth daily, Disp: , Rfl:     hydrocortisone  2.5 % cream, 2 (two) times daily as needed, Disp: , Rfl:     metoprolol  (LOPRESSOR ) 100 MG tablet, Take 0.5 tablets (50 mg) by mouth 2 (two) times daily, Disp: , Rfl:     rivaroxaban  (XARELTO ) 10 MG Tab, Take 1 tablet (10 mg) by mouth daily, Disp: , Rfl:     timolol  (TIMOPTIC ) 0.5 % ophthalmic solution, Place 1 drop into the left eye daily, Disp: , Rfl:     Yuvafem 10 MCG Tab, Place 10 mcg vaginally every third day, Disp: , Rfl:

## 2023-09-01 NOTE — Telephone Encounter (Signed)
 During visit, patient stated that xarelto cost had increased with the new year. Patient inquired if there were any assistance programs. RN assisted patient with signing up for "Xarelto withMe" savings card. Card printed and provided to patient.

## 2023-09-01 NOTE — Telephone Encounter (Signed)
 Return to clinic in 6 months with ultrasound venous duplex of the left lower extremity and D-dimer level

## 2023-09-03 ENCOUNTER — Telehealth: Payer: Self-pay | Admitting: Medical Oncology

## 2023-09-03 NOTE — Telephone Encounter (Signed)
 Pt called asking if her Xarelto 10mg  Rx had been written. I advised her I did not see that medication in her meds list and I would send a msg to Dr. Beatriz Stallion team. Pt requests CB re: when the Xarelto has been ordered to confirm.

## 2023-09-05 ENCOUNTER — Other Ambulatory Visit: Payer: Self-pay | Admitting: Medical Oncology

## 2023-09-05 DIAGNOSIS — I8290 Acute embolism and thrombosis of unspecified vein: Secondary | ICD-10-CM

## 2023-09-05 MED ORDER — RIVAROXABAN 10 MG PO TABS
10.0000 mg | ORAL_TABLET | Freq: Every day | ORAL | 1 refills | Status: DC
Start: 2023-09-05 — End: 2024-03-07

## 2023-09-06 ENCOUNTER — Other Ambulatory Visit: Payer: Self-pay

## 2023-09-06 NOTE — Telephone Encounter (Signed)
 Pt aware of lab on 9/9 and f/u with Dr. Linna Darner on 9/11. Per pt's Appt Desk, Doppler US scheduled 9/12.

## 2023-09-06 NOTE — Telephone Encounter (Signed)
 Prescription has been sent to the pharmacy. Called to patient to notify. LMOM with information.

## 2023-09-07 NOTE — Telephone Encounter (Signed)
 Pt aware 9/11 appt with Dr. Linna Darner is rescheduled to 9/16.

## 2024-02-29 ENCOUNTER — Other Ambulatory Visit (FREE_STANDING_LABORATORY_FACILITY)

## 2024-02-29 DIAGNOSIS — I8290 Acute embolism and thrombosis of unspecified vein: Secondary | ICD-10-CM

## 2024-02-29 LAB — D-DIMER: D-Dimer: 0.82 ug{FEU}/mL — ABNORMAL HIGH (ref <0.50)

## 2024-03-02 ENCOUNTER — Ambulatory Visit: Admitting: Medical Oncology

## 2024-03-03 ENCOUNTER — Ambulatory Visit: Payer: Self-pay | Admitting: Medical Oncology

## 2024-03-03 ENCOUNTER — Other Ambulatory Visit: Payer: Self-pay | Admitting: Medical Oncology

## 2024-03-03 ENCOUNTER — Ambulatory Visit
Admission: RE | Admit: 2024-03-03 | Discharge: 2024-03-03 | Discharge status: Home / Self Care | Source: Ambulatory Visit | Attending: Medical Oncology

## 2024-03-03 ENCOUNTER — Telehealth: Payer: Self-pay

## 2024-03-03 DIAGNOSIS — I8290 Acute embolism and thrombosis of unspecified vein: Secondary | ICD-10-CM | POA: Insufficient documentation

## 2024-03-03 NOTE — Telephone Encounter (Addendum)
 Called to patient regarding doppler results. No answer. LMOM informing patient that she should continue Xarelto  as prescribed. If she is experiencing any increased swelling, pain, redness or warmth to the left lower extremity, then she should notify the clinic. Clinic contact information provided. Will attempt to call patient again before COB.    Update at 1623:  Attempted to call patient again. No answer. LMOM that this was a follow up to previous voice mail. Requested call back for any questions.

## 2024-03-07 ENCOUNTER — Ambulatory Visit: Attending: Medical Oncology | Admitting: Medical Oncology

## 2024-03-07 ENCOUNTER — Telehealth: Payer: Self-pay | Admitting: Medical Oncology

## 2024-03-07 ENCOUNTER — Other Ambulatory Visit: Payer: Self-pay

## 2024-03-07 ENCOUNTER — Encounter: Payer: Self-pay | Admitting: Medical Oncology

## 2024-03-07 VITALS — BP 101/73 | HR 79 | Temp 98.1°F | Resp 16 | Ht 62.0 in | Wt 125.0 lb

## 2024-03-07 DIAGNOSIS — I8290 Acute embolism and thrombosis of unspecified vein: Secondary | ICD-10-CM

## 2024-03-07 MED ORDER — ELIQUIS DVT/PE STARTER PACK 5 MG PO TBPK: ORAL_TABLET | ORAL | 0 refills | Status: DC

## 2024-03-07 NOTE — Progress Notes (Signed)
 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  Appearing in no acute distress.  Reviewed most recent blood work and D-dimer testing results which was slightly elevated from baseline.  Also, she was noted to have a new occlusive left calf vein thrombus.  On clinical evaluation, the calf appears slightly swollen but there is no tenderness, erythema, pain or discomfort.   Given that this new occlusive thrombus developed while on Xarelto , clinically, this can be considered a Xarelto  failure.  I would recommend her to be switched to Eliquis  based therapy going forward.   I provided them the Eliquis  starter pack prescription  I informed them that if there is any worsening leg pain, swelling, erythema, tenderness or any significant lower extremity symptoms, she will need to report to the emergency department for emergent assessment for thrombolysis.  I will have him follow-up in 3 months time for clinical visit.    History:   [name] is a 63 y.o. female who presents for follow-up visit for VTE  Past Medical History:     Past Medical History:   Diagnosis Date    Chronic ulcerative colitis (CMS/HCC)     nio issues for long time    Claustrophobia     Clotting disorder     DVT LLE 2012    Deep venous thrombosis of calf (CMS/HCC) 2012    LLL/on xeralto//    GERD (gastroesophageal reflux disease)      Glaucoma NEC     bilat    Hypertensive disorder     controlled well with medication    Ovarian cyst, right     Plantar fasciitis     Seasonal allergies        Past Surgical History:   Past Surgical History[1]    Family History:   Family History[2]    Social History:     Social History     Tobacco Use    Smoking status: Former     Current packs/day: 0.00     Types: Cigarettes     Quit date: 12/21/2010     Years since quitting: 13.2    Smokeless tobacco: Never   Substance Use Topics    Alcohol use: Yes     Alcohol/week: 1.0 standard drink of alcohol     Types: 1 Cans of beer per week     Comment: 1/week       Social History     Social History Narrative    Not on file     Allergies:   Allergies[3]    Medications:   Current Medications[4]     Review of Systems:   HEMATOLOGY FOCUSED ROS:    General  no fever, chills, night sweats, generalized weakness or fatigue.   Heme: No bleeding, bruising, rash, or  swelling in the neck, underarms or groin.  No pain or swelling in upper or lower extremities.  All other systems were reviewed and were negative except as indicated in the HPI.      Physical Exam:   Visit Vitals  BP 101/73 (BP Site: Left arm, Patient Position: Sitting, Cuff Size: Medium)   Pulse 79   Temp 98.1 F (36.7 C) (Oral)   Resp 16   Ht 1.575 m (5' 2)   Wt 56.7 kg (125 lb)   SpO2 98%   BMI 22.86 kg/m         General appearance -  well appearing, and in no distress   Mental status - alert, oriented to person, place, and time   Eyes - sclerae anicteric, slight conjunctival pallor   Mouth - mucous membranes moist   Lymph nodes - without cervical, axillary or inguinal adenopathy  Neck - supple,without adenopathy   Chest - clear to auscultation  Heart - normal rate, regular rhythm, without murmur   Abdomen - soft, nontender, nondistended, no masses or organomegaly   Extremities - no pedal edema, no clubbing or cyanosis  Skin - without ecchymoses, petechiae or rash  Neurologic- without localizing deficit      Labs  Reviewed:     Lab Results   Component Value Date    WBC 5.74 05/24/2023    HGB 9.2 (L) 05/24/2023    PLT 348 (H) 05/24/2023    MCV 75.0 (L) 05/24/2023        Lab Results   Component Value Date    NA 139 05/24/2023    K 5.0 05/24/2023    CL 107 05/24/2023    CA 9.2 05/24/2023    BUN 5 (L) 05/24/2023    CO2 22 05/24/2023    CREAT 0.7 05/24/2023    GLU 78 05/24/2023    AST 41 01/13/2023    ALT 30 01/13/2023    ALKPHOS 214 (H) 01/13/2023    BILITOTAL 0.8 01/13/2023        Lab Results   Component Value Date    PTT 30 01/19/2023    PT 26.2 (H) 05/24/2023    INR 2.3 05/24/2023    DDIMER 0.82 (H) 02/29/2024       Rads:   US  Venous Duplex Doppler Leg Left  Result Date: 03/03/2024   1. New occlusive thrombus in the left calf veins. 2. Decreased chronic nonocclusive thrombus in the proximal left femoral vein. 3. Unchanged mild chronic nonocclusive thrombus in the left popliteal vein. These urgent results were discussed with and acknowledged by HARVIN SERVE, MD on 03/03/2024 2:41 PM. [name], MD 03/03/2024 2:42 PM      Signed by:     HARVIN SERVE, MD  Hematology and Medical Oncology      [facility]          [1]   Past Surgical History:  Procedure Laterality Date    BUNIONECTOMY      left foot    CATARACT EXTRACTION      EYE SURGERY Bilateral 2013    laser surgery for glaucoma, bilateral trabeculectomy    GLAUCOMA SURGERY      HYSTERECTOMY      removed both ovaries and tubes    IVC FILTER PLACEMENT N/A 01/16/2023    Procedure: Temporary IVC FILTER PLACEMENT;  Surgeon: Rosine Camellia GRADE, MD;  [facility];  Service: Interventional Radiology;  Laterality: N/A;    IVC FILTER REMOVAL N/A 05/26/2023  Procedure: IVC Filter Removal;  Surgeon: Rosine Camellia GRADE, MD;  [facility];  Service: Interventional Radiology;  Laterality: N/A;    revision of hysterectomyl suture line      REVISION, BLEB, SCLERAL PATCH GRAFT Left 05/25/2019    Procedure: REVISION, BLEB;  Surgeon: Dominique Lucendia FERNS, MD;  Location: [place] [facility];  Service: Ophthalmology;  Laterality: Left;    TONSILLECTOMY      TRABECULECTOMY Left 03/22/2014    Procedure: TRABECULECTOMY;  Surgeon: Shade Glennda HERO, MD;  Location: [place] [facility];  Service: Ophthalmology;  Laterality: Left;  TRABECULECTOMY REVISION, LEFT EYE  Q1=N  ANES=MAC  EQUIP=MITOMYCIN  C, 0.4mg /cc, DISPENSE 5cc (ORDER TO FOLLOW)  MD REQ 30 MIN  ALLERGIES=COSOPT, AZOPT, ALPHAGAN  **Need RESIGHT SET from SPD    ureterectomy left     [2]   Family History  Problem Relation Name Age of Onset    Hypertension Father Lynwood     Vision loss Father Lynwood     Vision loss Brother Tim     Hypertension Paternal Aunt Marge     Vision loss Paternal Higinio [place]    [3]   Allergies  Allergen Reactions    Latex Itching   [4]   Current Outpatient Medications:     acetaminophen  (Tylenol ) 325 MG tablet, Take 2 tablets (650 mg) by mouth every 4 (four) hours as needed for Pain, Disp: , Rfl:     desloratadine (CLARINEX) 5 MG tablet, Take 1 tablet (5 mg) by mouth daily, Disp: , Rfl:     dexlansoprazole (Dexilant) 60 MG capsule, Take 1 capsule (60 mg) by mouth daily, Disp: , Rfl:     hydrocortisone  2.5 % cream, 2 (two) times daily as needed, Disp: , Rfl:     metoprolol  (LOPRESSOR ) 100 MG tablet, Take 0.5 tablets (50 mg) by mouth 2 (two) times daily, Disp: , Rfl:     timolol  (TIMOPTIC ) 0.5 % ophthalmic solution, Place 1 drop into the left eye daily, Disp: , Rfl:     Apixaban  Starter Pack (Eliquis  DVT/PE Starter Pack) 5 MG Tablet Therapy Pack, Take 10 mg by mouth every 12 (twelve) hours for 7 days, THEN 5 mg every 12 (twelve) hours for 23 days., Disp: 74 tablet, Rfl: 0

## 2024-03-07 NOTE — Telephone Encounter (Signed)
 RTC in 3 months with CBC, CMP.  Starting eliquis ; please see if she can get a coupon or financial assistance for eliquis .

## 2024-03-08 ENCOUNTER — Other Ambulatory Visit: Payer: Self-pay

## 2024-03-08 ENCOUNTER — Telehealth: Payer: Self-pay

## 2024-03-08 NOTE — Telephone Encounter (Signed)
 Per Pharmacy liaisons:  We're running our benefit investigation now and are unable to find active insurance for the patient. We'll reach out to her to see if she can provide us  that information.

## 2024-03-08 NOTE — Telephone Encounter (Addendum)
 Pt called to follow up on Eliquis  ?   States she has not heard from her pharmacy that it has been ordered yet    Note on order states:    [id number] - [place], [place] - [address]  This order has not been released to its destination.  [address] [place] [address]  Phone: [phone]  Fax: [phone]  DEA #: [id number]       Please update pt on status of order once complete-TY!

## 2024-03-08 NOTE — Progress Notes (Signed)
 The prescription has been received by the Pharmacy Care Team. A benefit investigation is currently in process.    To follow up on the outcome of the benefit investigation, please check Episodes, Referrals or specialty pharmacy encounter section in the Patient chart review

## 2024-03-08 NOTE — Telephone Encounter (Signed)
 Pt left a second message on the triage line asking for an update re: her Eliquis - Please call-TY

## 2024-03-08 NOTE — Telephone Encounter (Signed)
 Per liaisons :  We were able to touch base with the patient and the [facility].

## 2024-03-09 NOTE — Telephone Encounter (Signed)
 Per appt desk, pt is scheduled for f/u and labs on 12/8

## 2024-03-24 ENCOUNTER — Other Ambulatory Visit: Payer: Self-pay

## 2024-03-24 ENCOUNTER — Telehealth: Payer: Self-pay

## 2024-03-24 DIAGNOSIS — I82492 Acute embolism and thrombosis of other specified deep vein of left lower extremity: Secondary | ICD-10-CM

## 2024-03-24 DIAGNOSIS — I8290 Acute embolism and thrombosis of unspecified vein: Secondary | ICD-10-CM

## 2024-03-24 DIAGNOSIS — I82592 Chronic embolism and thrombosis of other specified deep vein of left lower extremity: Secondary | ICD-10-CM

## 2024-03-24 NOTE — Telephone Encounter (Signed)
 Reviewed eliquis  copay with Dr. Jeryl. As no additional assistance program available at this time, patient should resume Xarelto  10 mg daily. Called patient to notify. She verbalized understanding. Denied need for new prescription, as she still has medication at home.

## 2024-03-24 NOTE — Telephone Encounter (Signed)
 Pt called again   Is concerned that she is going back on xarelto  when it was suggested she take Eliquis  for the new clots    Pt states she was given samples of Eliquis  when she first started and believes she has about 2 weeks of medication left    Asking if she should finishe  taht then strt back on Xarelto ?    Secure chat with Dr Jeryl Bullock she can stay on Eliqiuis and they will repeat an US  to detrmine next steps    Team:    Pt will await a call on Monday re: when to schedule the US    States she will have the exact number of days she has left to take the Eliquis  to share at that time    Advised team will clarify exactly when Dr Jeryl would like the next US  performed  - Before resuming Xarelto  or after?

## 2024-03-24 NOTE — Telephone Encounter (Signed)
 Pt left message for Dr Towanda team     States she was given a coupon for a discounted price on Eliquis  last week but her pharmacy is not accepting it    States they are charging her over $600.00    Asking what to do?

## 2024-03-24 NOTE — Telephone Encounter (Signed)
 Please schedule patient for left leg doppler in 2-3 weeks. Orders placed.

## 2024-03-27 NOTE — Telephone Encounter (Signed)
 Called to patient to inquire how many days of Eliquis  she has remaining. Per provider, she should have US  following completion of Eliquis  and prior to resuming Xarelto . Called both home and cell numbers. LMOM with reason for call and request for callback to provide requested information.

## 2024-03-27 NOTE — Telephone Encounter (Signed)
 Pt scheduled for 10/16  at [facility] at 2pm.   K.F    Please schedule patient for left leg doppler in 2-3 weeks. Orders placed.

## 2024-04-06 ENCOUNTER — Ambulatory Visit: Admission: RE | Admit: 2024-04-06 | Discharge: 2024-04-06 | Attending: Medical Oncology

## 2024-04-06 DIAGNOSIS — I8290 Acute embolism and thrombosis of unspecified vein: Secondary | ICD-10-CM | POA: Insufficient documentation

## 2024-04-07 ENCOUNTER — Telehealth: Payer: Self-pay

## 2024-04-07 NOTE — Telephone Encounter (Signed)
 Received a VM from patient stating she never heard from Dr. Towanda team regarding the results of the LLE Venous Doppler U/S she underwent yesterday. Requesting a call back.    Reviewed U/S report (US  Venous Duplex Doppler Leg Left (04/06/2024 14:38)). Impression below:       1.No sonographic evidence for new or acute left lower extremity deep venous thrombosis.  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  3.Overall decreased but residual chronic nonocclusive thrombus in the calf veins as detailed above.    Returned call to [name]. Briefly reviewed the results of the U/S from yesterday. Made her aware that no new DVTs were seen. Pt asking if she needs to continue Eliquis . Advised that she continue it at this time and I can ask someone from Dr. Towanda team to follow-up with her next week regarding plan of care. Pt verbalized understanding and voiced appreciation for the call back.     Dr. Jeryl + Team: Please review U/S results and follow-up with patient regarding plan of care. Pt wants to know if she can discontinue Eliquis . [name] can be reached at [phone].

## 2024-04-10 ENCOUNTER — Telehealth: Payer: Self-pay

## 2024-04-10 NOTE — Telephone Encounter (Signed)
 Duplicate encounter

## 2024-04-10 NOTE — Telephone Encounter (Signed)
 US  results reviewed by provider. He recommends that patient resume Xarelto  10 mg once Eliquis  completed (as Eliquis  is not covered by patient's insurance). Attempted to call patient on both home and cell phone. No answer. LMOM with recommendations. Requested call back for any questions.

## 2024-04-20 NOTE — Telephone Encounter (Signed)
 Pt left VM reporting her Eliquis  OOP is $600. She stated she has leftover Xarelto , and asked if she can instead take one of each medication.    Per notes from [facility], Dr. Jeryl reviewed US  results and recommends that patient resume Xarelto  10 mg once Eliquis  completed.    Called pt to discuss above, and reviewed US  results from (US  Venous Duplex Doppler Leg Left (04/06/2024 14:38). Pt verbalized understanding of and agreement with plan.

## 2024-05-30 ENCOUNTER — Other Ambulatory Visit: Payer: Self-pay

## 2024-05-30 ENCOUNTER — Telehealth: Payer: Self-pay | Admitting: Medical Oncology

## 2024-05-30 ENCOUNTER — Other Ambulatory Visit (FREE_STANDING_LABORATORY_FACILITY)

## 2024-05-30 ENCOUNTER — Ambulatory Visit: Attending: Anatomic and Clinical Pathology | Admitting: Medical Oncology

## 2024-05-30 VITALS — BP 83/58 | HR 92 | Temp 97.8°F | Wt 131.0 lb

## 2024-05-30 DIAGNOSIS — I8290 Acute embolism and thrombosis of unspecified vein: Secondary | ICD-10-CM | POA: Insufficient documentation

## 2024-05-30 LAB — LAB USE ONLY - CBC WITH DIFFERENTIAL
Absolute Basophils: 0.02 x10 3/uL (ref 0.00–0.08)
Absolute Eosinophils: 0.12 x10 3/uL (ref 0.00–0.44)
Absolute Immature Granulocytes: 0.01 x10 3/uL (ref 0.00–0.07)
Absolute Lymphocytes: 1.97 x10 3/uL (ref 0.42–3.22)
Absolute Monocytes: 0.83 x10 3/uL (ref 0.21–0.85)
Absolute Neutrophils: 2.23 x10 3/uL (ref 1.10–6.33)
Absolute nRBC: 0 x10 3/uL (ref <=0.00)
Basophils %: 0.4 %
Eosinophils %: 2.3 %
Hematocrit: 25.2 % — ABNORMAL LOW (ref 34.7–43.7)
Hemoglobin: 7.6 g/dL — ABNORMAL LOW (ref 11.4–14.8)
Immature Granulocytes %: 0.2 %
Lymphocytes %: 38 %
MCH: 24.9 pg — ABNORMAL LOW (ref 25.1–33.5)
MCHC: 30.2 g/dL — ABNORMAL LOW (ref 31.5–35.8)
MCV: 82.6 fL (ref 78.0–96.0)
MPV: 9.1 fL (ref 8.9–12.5)
Monocytes %: 16 %
Neutrophils %: 43.1 %
Platelet Count: 276 x10 3/uL (ref 142–346)
Preliminary Absolute Neutrophil Count: 2.23 x10 3/uL (ref 1.10–6.33)
RBC: 3.05 x10 6/uL — ABNORMAL LOW (ref 3.90–5.10)
RDW: 19 % — ABNORMAL HIGH (ref 11–15)
WBC: 5.18 x10 3/uL (ref 3.10–9.50)
nRBC %: 0 /100{WBCs} (ref <=0.0)

## 2024-05-30 LAB — RRL - CMP
ALT Piccolo(R): <20 U/L (ref 10–47)
AST Piccolo(R): 38 U/L (ref 11–38)
Albumin Piccolo(R): 2.8 g/dL — ABNORMAL LOW (ref 3.3–5.0)
Alkaline Phosphatase Piccolo(R): 125 U/L (ref 42–141)
Anion Gap Piccolo(R): 8
BUN Piccolo(R): 6 mg/dL — ABNORMAL LOW (ref 7.0–19.0)
Bilirubin Total Piccolo(R): 0.5 mg/dL (ref 0.2–1.6)
CO2 Piccolo(R): 22 meq/L (ref 21–29)
Calcium Piccolo(R): 8.8 mg/dL (ref 8.5–10.5)
Chloride Piccolo(R): 106 meq/L (ref 98–107)
Creatinine Piccolo(R): 0.9 mg/dL (ref 0.4–1.5)
GFR: >60 mg/dL (ref >=60.0)
Glucose Piccolo(R): 86 mg/dL (ref 70–100)
Potassium Piccolo(R): 5.1 meq/L (ref 3.5–5.1)
Protein Piccolo(R): 6.6 g/dL (ref 6.4–8.1)
Sodium Piccolo(R): 136 meq/L (ref 136–145)

## 2024-05-30 MED ORDER — DABIGATRAN ETEXILATE MESYLATE 150 MG PO CAPS: 150.0000 mg | ORAL_CAPSULE | Freq: Two times a day (BID) | ORAL | 1 refills | Status: AC

## 2024-05-30 NOTE — Progress Notes (Signed)
 The prescription has been received by the Pharmacy Care Team. A benefit investigation is currently in process.    To follow up on the outcome of the benefit investigation, please check Episodes, Referrals or specialty pharmacy encounter section in the Patient chart review

## 2024-05-30 NOTE — Telephone Encounter (Signed)
 Per appt desk, pt is scheduled for f/u on 08/31/2024

## 2024-05-30 NOTE — Telephone Encounter (Signed)
 RTC in 3 months for follow-up visit

## 2024-05-30 NOTE — Progress Notes (Signed)
 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  Appearing in no acute distress.  She is compliant with Eliquis  therapy however is having issues with the cost with regards to the Eliquis .  Will switch her to Pradaxa  based anticoagulation going forward.  She has had recurrent DVT of the lower extremity, and will likely require long-term systemic anticoagulation.  She continues to have swelling in her left lower extremity.  I informed them that if there is any worsening leg pain, swelling, erythema, tenderness or any significant lower extremity symptoms, she will need to report to the emergency department for emergent assessment for thrombolysis.  I will have him follow-up in 3 months time for clinical visit.    History:   [name] is a 63 y.o. female who presents for follow-up visit for VTE  Past Medical History:     Past Medical History:   Diagnosis Date    Chronic ulcerative colitis (CMS/HCC)     nio issues for long time    Claustrophobia     Clotting disorder     DVT LLE 2012    Deep venous thrombosis of calf (CMS/HCC) 2012    LLL/on xeralto//    GERD (gastroesophageal reflux disease)     Glaucoma NEC     bilat    Hypertensive disorder     controlled well with medication    Ovarian cyst, right     Plantar fasciitis     Seasonal allergies        Past Surgical History:   Past Surgical  History[1]    Family History:   Family History[2]    Social History:     Social History     Tobacco Use    Smoking status: Former     Current packs/day: 0.00     Types: Cigarettes     Quit date: 12/21/2010     Years since quitting: 13.4    Smokeless tobacco: Never   Substance Use Topics    Alcohol use: Yes     Alcohol/week: 1.0 standard drink of alcohol     Types: 1 Cans of beer per week     Comment: 1/week       Social History     Social History Narrative    Not on file     Allergies:   Allergies[3]    Medications:   Current Medications[4]     Review of Systems:   HEMATOLOGY FOCUSED ROS:    General  no fever, chills, night sweats, generalized weakness or fatigue.   Heme: No bleeding, bruising, rash, or swelling in the neck, underarms or groin.  No pain or swelling in upper or lower extremities.  All other systems were reviewed and were negative except as indicated in the HPI.  Physical Exam:   There were no vitals taken for this visit.        General appearance -  well appearing, and in no distress   Mental status - alert, oriented to person, place, and time   Eyes - sclerae anicteric, slight conjunctival pallor   Mouth - mucous membranes moist   Lymph nodes - without cervical, axillary or inguinal adenopathy  Neck - supple,without adenopathy   Chest - clear to auscultation  Heart - normal rate, regular rhythm, without murmur   Abdomen - soft, nontender, nondistended, no masses or organomegaly   Extremities - no pedal edema, no clubbing or cyanosis  Skin - without ecchymoses, petechiae or rash  Neurologic- without localizing deficit      Labs Reviewed:     Lab Results   Component Value Date    WBC 5.18 05/30/2024    HGB 7.6 (L) 05/30/2024    PLT 276 05/30/2024    MCV 82.6 05/30/2024        Lab Results   Component Value Date    NA 139 05/24/2023    K 5.0 05/24/2023    CL 107 05/24/2023    CA 9.2 05/24/2023    BUN 5 (L) 05/24/2023    CO2 22 05/24/2023    CREAT 0.7 05/24/2023    GLU 78 05/24/2023    AST 41  01/13/2023    ALT 30 01/13/2023    ALKPHOS 214 (H) 01/13/2023    BILITOTAL 0.8 01/13/2023        Lab Results   Component Value Date    PTT 30 01/19/2023    PT 26.2 (H) 05/24/2023    INR 2.3 05/24/2023    DDIMER 0.82 (H) 02/29/2024       Rads:   No results found.      Signed by:     Harvin Serve, MD  Hematology and Medical Oncology      [facility]          [1]   Past Surgical History:  Procedure Laterality Date    BUNIONECTOMY      left foot    CATARACT EXTRACTION      EYE SURGERY Bilateral 2013    laser surgery for glaucoma, bilateral trabeculectomy    GLAUCOMA SURGERY      HYSTERECTOMY      removed both ovaries and tubes    IVC FILTER PLACEMENT N/A 01/16/2023    Procedure: Temporary IVC FILTER PLACEMENT;  Surgeon: Rosine Camellia GRADE, MD;  Location: [facility];  Service: Interventional Radiology;  Laterality: N/A;    IVC FILTER REMOVAL N/A 05/26/2023    Procedure: IVC Filter Removal;  Surgeon: Rosine Camellia GRADE, MD;  Location: [facility];  Service: Interventional Radiology;  Laterality: N/A;    revision of hysterectomyl suture line      REVISION, BLEB, SCLERAL PATCH GRAFT Left 05/25/2019    Procedure: REVISION, BLEB;  Surgeon: Dominique Lucendia FERNS, MD;  Location: [facility];  Service: Ophthalmology;  Laterality: Left;    TONSILLECTOMY      TRABECULECTOMY Left 03/22/2014    Procedure: TRABECULECTOMY;  Surgeon: Shade Glennda HERO, MD;  Location: [facility];  Service: Ophthalmology;  Laterality: Left;  TRABECULECTOMY REVISION, LEFT EYE  Q1=N  ANES=MAC  EQUIP=MITOMYCIN  C, 0.4mg /cc, DISPENSE 5cc (ORDER TO FOLLOW)  MD REQ 30 MIN  ALLERGIES=COSOPT, AZOPT, ALPHAGAN  **Need RESIGHT SET from SPD    ureterectomy left     [2]   Family  History  Problem Relation Name Age of Onset    Hypertension Father Lynwood     Vision loss Father Lynwood     Vision loss Brother Tim     Hypertension Paternal Aunt Marge     Vision loss Paternal Higinio [place]    [3]   Allergies  Allergen Reactions    Latex Itching   [4]   Current Outpatient  Medications:     acetaminophen  (Tylenol ) 325 MG tablet, Take 2 tablets (650 mg) by mouth every 4 (four) hours as needed for Pain, Disp: , Rfl:     Apixaban  Starter Pack (Eliquis  DVT/PE Starter Pack) 5 MG Tablet Therapy Pack, Take 10 mg by mouth every 12 (twelve) hours for 7 days, THEN 5 mg every 12 (twelve) hours for 23 days., Disp: 74 tablet, Rfl: 0    desloratadine (CLARINEX) 5 MG tablet, Take 1 tablet (5 mg) by mouth daily, Disp: , Rfl:     dexlansoprazole (Dexilant) 60 MG capsule, Take 1 capsule (60 mg) by mouth daily, Disp: , Rfl:     hydrocortisone  2.5 % cream, 2 (two) times daily as needed, Disp: , Rfl:     metoprolol  (LOPRESSOR ) 100 MG tablet, Take 0.5 tablets (50 mg) by mouth 2 (two) times daily, Disp: , Rfl:     timolol  (TIMOPTIC ) 0.5 % ophthalmic solution, Place 1 drop into the left eye daily, Disp: , Rfl:

## 2024-05-31 ENCOUNTER — Telehealth: Payer: Self-pay

## 2024-05-31 NOTE — Telephone Encounter (Signed)
 Note prescription has been released/received -TY!

## 2024-05-31 NOTE — Telephone Encounter (Signed)
 Pt called again   States the Pradaxa  will cost $190  and she cannot afford that. Asking for help getting cost down

## 2024-05-31 NOTE — Telephone Encounter (Signed)
 Pt called to f/u on the new order for a blood thinner she was to receive   Note the order for Pradaxa  was written yesterday but has not been released yet    Liaisons please release to pharmacy on file - TY!

## 2024-06-01 NOTE — Telephone Encounter (Signed)
 Called to patient to discuss sending Pradaxa  prescription to [facility]. No answer. LMOM with request to call back.

## 2024-08-31 ENCOUNTER — Other Ambulatory Visit (HOSPITAL_BASED_OUTPATIENT_CLINIC_OR_DEPARTMENT_OTHER)

## 2024-08-31 ENCOUNTER — Ambulatory Visit: Admitting: Medical Oncology

## 2024-11-28 ENCOUNTER — Other Ambulatory Visit (HOSPITAL_BASED_OUTPATIENT_CLINIC_OR_DEPARTMENT_OTHER)

## 2024-11-28 ENCOUNTER — Ambulatory Visit: Admitting: Medical Oncology
